# Patient Record
Sex: Female | Born: 1940 | Race: White | Hispanic: No | Smoking: Former smoker
Health system: Southern US, Community
[De-identification: ages and names within clinical notes are randomized; demographics above are authoritative.]

## PROBLEM LIST (undated history)

## (undated) DIAGNOSIS — W19XXXA Unspecified fall, initial encounter: Secondary | ICD-10-CM

## (undated) DIAGNOSIS — I10 Essential (primary) hypertension: Secondary | ICD-10-CM

## (undated) DIAGNOSIS — J309 Allergic rhinitis, unspecified: Secondary | ICD-10-CM

## (undated) DIAGNOSIS — D689 Coagulation defect, unspecified: Secondary | ICD-10-CM

## (undated) DIAGNOSIS — Z8679 Personal history of other diseases of the circulatory system: Secondary | ICD-10-CM

## (undated) DIAGNOSIS — I2510 Atherosclerotic heart disease of native coronary artery without angina pectoris: Secondary | ICD-10-CM

## (undated) DIAGNOSIS — S069X9A Unspecified intracranial injury with loss of consciousness of unspecified duration, initial encounter: Secondary | ICD-10-CM

## (undated) DIAGNOSIS — S069XAA Unspecified intracranial injury with loss of consciousness status unknown, initial encounter: Secondary | ICD-10-CM

## (undated) DIAGNOSIS — F410 Panic disorder [episodic paroxysmal anxiety] without agoraphobia: Secondary | ICD-10-CM

## (undated) DIAGNOSIS — E785 Hyperlipidemia, unspecified: Secondary | ICD-10-CM

## (undated) DIAGNOSIS — I340 Nonrheumatic mitral (valve) insufficiency: Secondary | ICD-10-CM

## (undated) DIAGNOSIS — R32 Unspecified urinary incontinence: Secondary | ICD-10-CM

## (undated) DIAGNOSIS — G4733 Obstructive sleep apnea (adult) (pediatric): Secondary | ICD-10-CM

## (undated) DIAGNOSIS — R296 Repeated falls: Secondary | ICD-10-CM

## (undated) DIAGNOSIS — E039 Hypothyroidism, unspecified: Secondary | ICD-10-CM

## (undated) DIAGNOSIS — F419 Anxiety disorder, unspecified: Secondary | ICD-10-CM

## (undated) DIAGNOSIS — I772 Rupture of artery: Secondary | ICD-10-CM

## (undated) HISTORY — PX: MULTIPLE TOOTH EXTRACTIONS: SHX2053

## (undated) HISTORY — PX: BUNIONETTE EXCISION: SUR500

## (undated) HISTORY — DX: Atherosclerotic heart disease of native coronary artery without angina pectoris: I25.10

## (undated) HISTORY — DX: Essential (primary) hypertension: I10

## (undated) HISTORY — DX: Unspecified intracranial injury with loss of consciousness of unspecified duration, initial encounter: S06.9X9A

## (undated) HISTORY — DX: Coagulation defect, unspecified: D68.9

## (undated) HISTORY — DX: Hypothyroidism, unspecified: E03.9

## (undated) HISTORY — PX: TOTAL KNEE ARTHROPLASTY: SHX125

## (undated) HISTORY — DX: Obstructive sleep apnea (adult) (pediatric): G47.33

## (undated) HISTORY — DX: Unspecified intracranial injury with loss of consciousness status unknown, initial encounter: S06.9XAA

## (undated) HISTORY — DX: Rupture of artery: I77.2

## (undated) HISTORY — PX: THYROIDECTOMY: SHX17

## (undated) HISTORY — DX: Personal history of other diseases of the circulatory system: Z86.79

## (undated) HISTORY — DX: Allergic rhinitis, unspecified: J30.9

## (undated) HISTORY — DX: Hyperlipidemia, unspecified: E78.5

## (undated) HISTORY — DX: Repeated falls: R29.6

## (undated) HISTORY — DX: Unspecified urinary incontinence: R32

## (undated) HISTORY — DX: Anxiety disorder, unspecified: F41.9

## (undated) HISTORY — DX: Nonrheumatic mitral (valve) insufficiency: I34.0

## (undated) HISTORY — DX: Panic disorder (episodic paroxysmal anxiety): F41.0

## (undated) HISTORY — DX: Unspecified fall, initial encounter: W19.XXXA

---

## 2001-11-12 ENCOUNTER — Other Ambulatory Visit: Admission: RE | Admit: 2001-11-12 | Discharge: 2001-11-12 | Payer: Self-pay | Admitting: Family Medicine

## 2004-09-20 ENCOUNTER — Ambulatory Visit: Payer: Self-pay | Admitting: Family Medicine

## 2005-02-24 ENCOUNTER — Ambulatory Visit: Payer: Self-pay | Admitting: Otolaryngology

## 2005-02-24 ENCOUNTER — Other Ambulatory Visit: Payer: Self-pay

## 2005-03-03 ENCOUNTER — Inpatient Hospital Stay: Payer: Self-pay | Admitting: Otolaryngology

## 2006-01-05 ENCOUNTER — Ambulatory Visit: Payer: Self-pay | Admitting: Family Medicine

## 2007-07-04 HISTORY — PX: CARDIAC CATHETERIZATION: SHX172

## 2007-07-04 HISTORY — PX: CORONARY STENT PLACEMENT: SHX1402

## 2007-07-09 ENCOUNTER — Inpatient Hospital Stay: Payer: Self-pay | Admitting: Cardiovascular Disease

## 2007-07-09 ENCOUNTER — Other Ambulatory Visit: Payer: Self-pay

## 2007-09-05 ENCOUNTER — Encounter: Payer: Self-pay | Admitting: Cardiovascular Disease

## 2007-10-04 ENCOUNTER — Encounter: Payer: Medicare Other | Admitting: Cardiovascular Disease

## 2007-11-04 ENCOUNTER — Encounter: Payer: Medicare Other | Admitting: Cardiovascular Disease

## 2007-12-02 ENCOUNTER — Encounter: Payer: Medicare Other | Admitting: Cardiovascular Disease

## 2008-04-22 ENCOUNTER — Emergency Department: Payer: Medicare Other | Admitting: Emergency Medicine

## 2009-03-11 ENCOUNTER — Ambulatory Visit: Payer: Self-pay | Admitting: Orthopedic Surgery

## 2009-03-19 ENCOUNTER — Ambulatory Visit: Payer: Self-pay | Admitting: Orthopedic Surgery

## 2009-04-18 ENCOUNTER — Emergency Department: Payer: Self-pay | Admitting: Emergency Medicine

## 2009-05-11 ENCOUNTER — Ambulatory Visit: Payer: Self-pay | Admitting: Internal Medicine

## 2009-09-23 ENCOUNTER — Emergency Department: Payer: Self-pay | Admitting: Emergency Medicine

## 2009-10-06 ENCOUNTER — Ambulatory Visit: Payer: Self-pay | Admitting: Internal Medicine

## 2010-04-16 ENCOUNTER — Ambulatory Visit: Payer: Self-pay | Admitting: Cardiovascular Disease

## 2010-05-12 ENCOUNTER — Ambulatory Visit: Payer: Self-pay | Admitting: Internal Medicine

## 2010-08-09 ENCOUNTER — Telehealth: Payer: Self-pay | Admitting: Cardiovascular Disease

## 2010-08-12 ENCOUNTER — Ambulatory Visit: Payer: Self-pay | Admitting: Cardiovascular Disease

## 2010-08-20 ENCOUNTER — Encounter: Payer: Self-pay | Admitting: Rheumatology

## 2010-08-23 ENCOUNTER — Telehealth (INDEPENDENT_AMBULATORY_CARE_PROVIDER_SITE_OTHER): Payer: Self-pay | Admitting: Radiology

## 2010-08-24 ENCOUNTER — Ambulatory Visit: Payer: Self-pay | Admitting: Cardiology

## 2010-08-24 ENCOUNTER — Ambulatory Visit: Payer: Self-pay

## 2010-08-24 ENCOUNTER — Encounter (HOSPITAL_COMMUNITY)
Admission: RE | Admit: 2010-08-24 | Discharge: 2010-11-02 | Payer: Self-pay | Source: Home / Self Care | Attending: Cardiovascular Disease | Admitting: Cardiovascular Disease

## 2010-08-24 ENCOUNTER — Encounter: Payer: Self-pay | Admitting: Cardiology

## 2010-08-30 ENCOUNTER — Telehealth: Payer: Self-pay | Admitting: Cardiovascular Disease

## 2010-09-02 ENCOUNTER — Encounter: Payer: Self-pay | Admitting: Rheumatology

## 2010-09-07 ENCOUNTER — Ambulatory Visit: Payer: Self-pay | Admitting: Emergency Medicine

## 2010-09-10 LAB — PATHOLOGY REPORT

## 2010-11-02 NOTE — Assessment & Plan Note (Signed)
Summary: Cardiology Nuclear Testing  Nuclear Med Background Indications for Stress Test: Evaluation for Ischemia, Stent Patency, PTCA Patency   History: Angioplasty, Heart Catheterization, Myocardial Perfusion Study, Stents  History Comments: '08 MPS:(+)>PTCA/Stent-LAD  Symptoms: Dizziness, DOE, Fatigue, Nausea, SOB  Symptoms Comments: .   Nuclear Pre-Procedure Cardiac Risk Factors: History of Smoking, Lipids Caffeine/Decaff Intake: none NPO After: 7:00 PM Lungs: Clear. IV 0.9% NS with Angio Cath: 20g     IV Site: R Hand IV Started by: Doyne Keel, CNMT Chest Size (in) 36     Cup Size B     Height (in): 64 Weight (lb): 111 BMI: 19.12  Nuclear Med Study 1 or 2 day study:  1 day     Stress Test Type:  Stress Reading MD:  Marca Ancona, MD     Referring MD:  Lorine Bears, MD Resting Radionuclide:  Technetium 56m Tetrofosmin     Resting Radionuclide Dose:  11 mCi  Stress Radionuclide:  Technetium 72m Tetrofosmin     Stress Radionuclide Dose:  33 mCi   Stress Protocol Exercise Time (min):  7:00 min     Max HR:  157 bpm     Predicted Max HR:  149 bpm  Max Systolic BP: 206 mm Hg     Percent Max HR:  105.37 %     METS: 8.7 Rate Pressure Product:  46962    Stress Test Technologist:  Rea [employer], CMA-N     Nuclear Technologist:  Doyne Keel, CNMT  Rest Procedure  Myocardial perfusion imaging was performed at rest 45 minutes following the intravenous administration of Technetium 66m Tetrofosmin.  Stress Procedure  The patient exercised for seven minutes.  The patient stopped due to fatigue and denied any chest pain.  There were no significant ST-T wave changes; only occasional PVC's and PAC's.  She had a mild hypertensive response to exercise, 206/85.  Technetium 41m Tetrofosmin was injected at peak exercise and myocardial perfusion imaging was performed after a brief delay.  QPS Raw Data Images:  Normal; no motion artifact; normal heart/lung ratio. Stress Images:  Mild basal  to mid inferior and inferolateral perfusion defect.  Rest Images:  Mild basal to mid inferior and inferolateral perfusion defect.  Subtraction (SDS):  Mild fixed basal to mid inferior and inferolateral perfusion defect.  Transient Ischemic Dilatation:  0.89  (Normal <1.22)  Lung/Heart Ratio:  0.29  (Normal <0.45)  Quantitative Gated Spect Images QGS EDV:  76 ml QGS ESV:  28 ml QGS EF:  63 % QGS cine images:  Normal wall motion.    Overall Impression  Exercise Capacity: Good exercise capacity. BP Response: Hypertensive blood pressure response. Clinical Symptoms: No chest pain, stopped due to fatigue.  ECG Impression: Insignificant upsloping ST segment depression. Overall Impression: Mild fixed basal to mid inferior and inferolateral perfusion defect.  Given normal wall motion, favor attenuation.   Overall Impression Comments: Low risk study.

## 2010-11-02 NOTE — Progress Notes (Signed)
Summary: stress test results  Phone Note Outgoing Call   Call placed by: Dessie Coma  LPN,  August 30, 2010 3:58 PM Call placed to: Patient Summary of Call: LMVM-notified patient per Dr. Kirke Corin, stress test was normal.  If any questions to call office.

## 2010-11-02 NOTE — Progress Notes (Signed)
Summary: bleeding problems on Plavix  Phone Note Call from Patient   Caller: Patient Summary of Call: Patient wants to know if OK to have a colonoscopy this Wednesday.  Is on Plavix and states she has a lot of bleeding problems-several nose bleeds, excessive bleeding from where dog scratched her Sunday.  Should she stop Plavix before-not sure what to do.  Initial call taken by: Dessie Coma  LPN,  August 09, 2010 8:54 AM  Follow-up for Phone Call        Phone Call Completed:  Patient's daughter called -informed her per Dr. Kirke Corin, OK for patient to have colonoscopy but needs to hold Plavix for 1 week prior.  Appt. scheduled for patient due to patient having nosebleeds-thinks her BP may be elevated.  Follow-up by: Dessie Coma  LPN,  August 09, 2010 1:23 PM

## 2010-11-02 NOTE — Progress Notes (Signed)
Summary: nuc pre-procedure  Phone Note Outgoing Call   Call placed by: Harlow Asa CNMT Call placed to: Patient Reason for Call: Confirm/change Appt Summary of Call: Reviewed information on [facility] Information Sheet (see scanned document for further details).  Spoke with patient.  Initial call taken by: Harlow Asa CNMT     Nuclear Med Background Indications for Stress Test: Evaluation for Ischemia, Stent Patency, PTCA Patency   History: Angioplasty, Heart Catheterization, Stents  History Comments: '08 cath/ptca/stents-LAD  Symptoms: Nausea  Symptoms Comments: headache   Nuclear Pre-Procedure Cardiac Risk Factors: Lipids

## 2011-02-03 ENCOUNTER — Encounter: Payer: Self-pay | Admitting: Cardiovascular Disease

## 2011-02-04 ENCOUNTER — Ambulatory Visit: Payer: Self-pay | Admitting: Cardiovascular Disease

## 2011-02-07 ENCOUNTER — Ambulatory Visit (INDEPENDENT_AMBULATORY_CARE_PROVIDER_SITE_OTHER): Payer: Medicare Other | Admitting: Cardiovascular Disease

## 2011-02-07 ENCOUNTER — Encounter: Payer: Self-pay | Admitting: Cardiovascular Disease

## 2011-02-07 DIAGNOSIS — I2510 Atherosclerotic heart disease of native coronary artery without angina pectoris: Secondary | ICD-10-CM | POA: Insufficient documentation

## 2011-02-07 DIAGNOSIS — E785 Hyperlipidemia, unspecified: Secondary | ICD-10-CM | POA: Insufficient documentation

## 2011-02-07 NOTE — Assessment & Plan Note (Signed)
She had a recent lipid profile which showed a total cholesterol of 176, triglyceride of 74, HDL of 50 and an LDL of 111. His CBC was unremarkable. CMP was within normal limits as well. The patient was not taking her Crestor regularly and also was not faithful with healthy diet. She is now taking the medication regularly and I asked her to improve her diet. I recommend repeating lipid profile in 3 months from now. Goal LDL is less than 70.  Would consider increasing Crestor at that time.

## 2011-02-07 NOTE — Assessment & Plan Note (Signed)
The patient is doing well at this time with no symptoms suggestive of angina, heart failure or arrhythmia. Her stress test from November of last year was overall unremarkable. Would continue with medical therapy for now. She was taken off Plavix given that she was treated for more than a few years after a drug-eluting stent and she was having extensive bruising with it.

## 2011-02-07 NOTE — Progress Notes (Signed)
HPI  Monica Downs is a 70 year old female who is here today for a followup visit. Overall she has been doing well since her last visit. The patient had multiple complaints during her last visit and thus she underwent nuclear stress test which over all showed no evidence of ischemia with normal ejection fraction. She underwent an EGD and colonoscopy. She was found to have some polyps that were removed. Overall, they were benign. She had her cholesterol checked which showed elevated LDL at 111. The patient was not taking Crestor regularly everyday at that time. She also was eating a lot of peanut butter. She denies any chest pain. She has mild exertional dyspnea which has not changed. There is no palpitations, syncope or presyncope. She was taken off Plavix recently without any reported events.  Allergies  Allergen Reactions  . Codeine   . Mobic   . Reclast (Zoledronic Acid)      Current Outpatient Prescriptions on File Prior to Visit  Medication Sig Dispense Refill  . aspirin 81 MG tablet Take 162 mg by mouth daily.       . Cholecalciferol (VITAMIN D) 2000 UNITS tablet Take 2,000 Units by mouth daily.        Marland Kitchen levothyroxine (SYNTHROID, LEVOTHROID) 75 MCG tablet Take 75 mcg by mouth daily.        . Multiple Vitamin (MULTIVITAMIN) tablet Take 1 tablet by mouth daily.        . niacin 500 MG tablet Take 500 mg by mouth daily with breakfast.        . rosuvastatin (CRESTOR) 20 MG tablet Take 20 mg by mouth daily.        Marland Kitchen venlafaxine (EFFEXOR-XR) 75 MG 24 hr capsule Take 75 mg by mouth daily.        Marland Kitchen DISCONTD: clopidogrel (PLAVIX) 75 MG tablet Take 75 mg by mouth daily.           Past Medical History  Diagnosis Date  . Hypothyroidism   . CAD (coronary artery disease)   . HLD (hyperlipidemia)      Past Surgical History  Procedure Date  . Thyroidectomy   . Coronary stent placement 07/2007    Mid LAD: 3.0 X 18 mm Xience stent  . Bunionette excision   . Cardiac catheterization 07/2007   99% mid LAD stenosis     Family History  Problem Relation Age of Onset  . Heart disease    . Colon cancer    . Lymphoma       History   Social History  . Marital Status: Widowed    Spouse Name: N/A    Number of Children: 1  . Years of Education: N/A   Occupational History  . retired    Social History Main Topics  . Smoking status: Former Games developer  . Smokeless tobacco: Not on file  . Alcohol Use: No  . Drug Use: No  . Sexually Active: Not on file   Other Topics Concern  . Not on file   Social History Narrative  . No narrative on file       PHYSICAL EXAM   BP 118/71  Pulse 82  Ht 5\' 4"  (1.626 m)  Wt 112 lb (50.803 kg)  BMI 19.22 kg/m2  SpO2 97%  Constitutional: She is oriented to person, place, and time. She appears well-developed and well-nourished. No distress.  HENT: No nasal discharge.  Head: Normocephalic and atraumatic.  Eyes: Pupils are equal, round, and reactive to light. Right eye  exhibits no discharge. Left eye exhibits no discharge.  Neck: Normal range of motion. Neck supple. No JVD present. No thyromegaly present.  Cardiovascular: Normal rate, regular rhythm, normal heart sounds and intact distal pulses. Exam reveals no gallop and no friction rub.  No murmur heard.  Pulmonary/Chest: Effort normal and breath sounds normal. No stridor. No respiratory distress. She has no wheezes. She has no rales. She exhibits no tenderness.  Abdominal: Soft. Bowel sounds are normal. She exhibits no distension. There is no tenderness. There is no rebound and no guarding.  Musculoskeletal: Normal range of motion. She exhibits no edema and no tenderness.  Neurological: She is alert and oriented to person, place, and time. Coordination normal.  Skin: Skin is warm and dry. No rash noted. She is not diaphoretic. No erythema. No pallor.  Psychiatric: She has a normal mood and affect. Her behavior is normal. Judgment and thought content normal.     ASSESSMENT AND  PLAN

## 2011-02-15 NOTE — Assessment & Plan Note (Signed)
[facility]                        [place] CARDIOLOGY OFFICE NOTE   NAME:Downs, Monica DROTAR                       MRN:          [id number]  DATE:08/12/2010                            DOB:          13-Feb-1941    Monica Downs is a 70 year old female who is here today for a followup  visit.  She has the following problem list:  1. History of coronary artery disease status post angioplasty and a      drug-eluting stent placement to the mid left anterior descending      coronary artery in October 2008.  The stent was Xience 3.0 x 18 mm.      Cardiac catheterization at that time showed 99% mid left anterior      descending coronary artery stenosis.  2. Hyperlipidemia.  3. Hypothyroidism.  4. Previous tobacco use.   INTERVAL HISTORY:  Monica Downs is here today on an urgent basis.  She has  not been feeling very well.  She has been having nausea and occasional  headache.  Some of these symptoms are similar to her previous  manifestations before her angioplasty.  Interestingly, although she had  a 99% mid LAD stenosis at that time, she did not have any chest pain and  no significant dyspnea.  She is also have been having difficulty with  easy bruising and bleeding.  Sometimes if she gets scratched, the  bleeding does not stop for more than an hour.  This has been a problem  for her especially that she has lots of dogs at home.  Otherwise she has  been taking the rest of her medications.  She has been having some  weight loss and is planned to have colonoscopy and EGD done.   MEDICATIONS:  1. Synthroid 0.75 mg once daily.  2. Multivitamin once daily.  3. Niacin 500 mg at bedtime.  4. Plavix 75 mg once daily, this will be stopped today.  5. Aspirin 81 mg once daily, this will be increased to 162 mg once      daily.  6. Vitamin D3 once daily.  7. Crestor 20 mg at bedtime.  8. Venlafaxine 75 mg once daily.   PHYSICAL EXAMINATION:  VITAL SIGNS:  Weight is 114  pounds, blood  pressure is 129/70, pulse is 76, oxygen saturation is 98% on room air.  NECK:  Reveals no JVD or carotid bruits.  LUNGS:  Clear to auscultation.  HEART:  Regular rate and rhythm with no gallops or murmurs.  ABDOMEN:  Benign, nontender, nondistended.  EXTREMITIES:  With no clubbing, cyanosis, or edema.  SKIN:  There is scattered bruising throughout upper and lower  extremities.   IMPRESSION:  1. Coronary artery disease status post angioplasty and drug-eluting      stent placement to the mid left anterior descending coronary artery      in October 2008.  She is currently having some symptoms of nausea      and headache that are similar to her previous stent placement.  Ms.      Downs never had chest pain even before her angioplasty.  Thus, we      will go ahead and obtain treadmill stress test for further      evaluation.  She has been having a significant bruising and      bleeding problems likely related to treatment with Plavix.  Her      stent placement was more than 3 years ago.  Thus, I think it is      safe enough to stop the Plavix at this time and continue with      aspirin 162 mg once daily.  I explained to her and her daughter      that there is always a small chance of very late stent thrombosis,      but it seems that risks of continuation outweigh the benefits at      this time.  The stress test will be done before proceeding with her      colonoscopy and EGD.  2. Hyperlipidemia:  We will continue with Crestor and niacin.  She      will follow up with me in 6 months from now or earlier if needed.     Lorine Bears, MD  Electronically Signed    MA/MedQ  DD: 08/12/2010  DT: 08/13/2010  Job #: [id number]

## 2011-02-15 NOTE — Assessment & Plan Note (Signed)
[facility]                        [place] CARDIOLOGY OFFICE NOTE   NAME:Downs, Monica ABUNDIS                       MRN:          [id number]  DATE:04/16/2010                            DOB:          09-09-1941    CHIEF COMPLAINT:  Followup.   PROBLEM LIST:  1. History of coronary artery disease, status post angioplasty and a      drug-eluting stent placement to the mid left anterior descending      coronary artery in October 2008.  The stent was a Xience 3.0 x 18      mm.  Cardiac catheterization at that time showed a 99% mid left      anterior descending coronary artery disease.  2. Hyperlipidemia.  3. Hypothyroidism.  4. Previous smoking.   INTERVAL HISTORY:  Monica Downs is here today to establish her care.  She  used to follow up with me in [place].  She is a pleasant 70 year old  female with known history of coronary artery disease as outlined above.  Overall since her angioplasty in 2008, she has been relatively stable  from a cardiac standpoint.  She did have few episodes of chest pain  after that, which was evaluated with a stress test most recently, early  this year.  The stress test at that time showed a fixed inferior defect,  which was felt to be due to artifact.   CURRENT MEDICATIONS:  1. Synthroid 0.75 mg once daily.  2. Multivitamin once daily.  3. Caltrate and vitamin D once daily.  4. Niacin 500 mg once daily.  5. Plavix 75 mg daily.  6. Aspirin 81 mg once daily.  7. Vitamin D 2000 units once daily.  8. Crestor 20 mg nightly.   ALLERGIES:  Include TRICLOSAN, CODEINE, and MOBIC.  Last time she was  seen here, she had 2 allergic reactions that required emergency room  visits with respiratory distress.  It was thought might have been due to  [facility] and RECLAST.   SOCIAL HISTORY:  Negative for smoking.  She quit in 1998.  There is no  alcohol or drug use.   FAMILY HISTORY:  Noncontributory.   REVIEW OF SYSTEMS:  Otherwise  negative.   PHYSICAL EXAMINATION:  GENERAL:  She is in no acute distress, but she  seems to be underweight.  VITAL SIGNS:  Weight is 116.8 pounds, blood pressure is 124/74 in the  left arm, pulse is 81, oxygen saturation is 97% on room air.  NECK:  No masses.  No JVD or carotid bruits.  RESPIRATORY:  Normal respiratory effort with no use of accessory  muscles.  Auscultation reveals normal breath sounds with no wheezing or  crackles.  CARDIOVASCULAR:  Normal PMI.  Normal S1 and S2.  There are no gallops or  murmurs.  ABDOMEN:  Benign, nontender, nondistended.  EXTREMITIES:  No clubbing, cyanosis, or edema.  Pedal pulses are normal.  SKIN:  The patient has multiple areas of small ecchymosis, which is  chronic.   Electrocardiogram was done and reviewed by me.  She has normal sinus  rhythm with incomplete right bundle-branch  block.  There is T-wave  inversion in V1-V3 suggestive of ischemia.   IMPRESSION:  1. Coronary artery disease, status post left anterior descending      coronary artery percutaneous coronary intervention in 2008.      Currently, she is not having any symptoms suggestive of ischemia.      Most recent stress test was in January 2011.  We will continue with      the medical therapy for now with dual antiplatelet therapy with      aspirin and Plavix.  Her blood pressure tends to run low, and she      is not on any beta-blockers at this time for that reason.  2. Hyperlipidemia.  We will continue with Crestor.  She will likely      need a fasting lipid profile during her next visit.  The patient      will follow up in 6 months from now or earlier if needed.     Lorine Bears, MD  Electronically Signed    MA/MedQ  DD: 04/16/2010  DT: 04/17/2010  Job #: [id number]

## 2011-05-04 ENCOUNTER — Telehealth: Payer: Self-pay | Admitting: Cardiovascular Disease

## 2011-05-04 NOTE — Telephone Encounter (Signed)
Pt's dtr has question re cholesterol med

## 2011-05-04 NOTE — Telephone Encounter (Signed)
Spoke with Pt she wants to make sure we rcvd info from [facility], she went there not feeling well since [facility] was not open on a Friday. I told her I would check in [place] tomorrow when I am there. I also informed her of our office in [place], if she needed something and we were not open she could call them and we would do whatever we could there also.

## 2011-05-12 ENCOUNTER — Encounter: Payer: Self-pay | Admitting: Cardiovascular Disease

## 2011-05-25 ENCOUNTER — Telehealth: Payer: Self-pay | Admitting: Cardiovascular Disease

## 2011-05-25 NOTE — Telephone Encounter (Signed)
Per pt call, pt was recently prescribed crestor. Pt said she is now having a lot of nose bleeds. Pt is concerned crestor is thinning pt blood too much. Please return pt call to advise/discuss.

## 2011-05-25 NOTE — Telephone Encounter (Signed)
Advised patient that Crestor should not be causing her nose bleeds. She has been having nose bleeds off & on for about 4-5 days. She is able to control it. I have advised her to follow up with her PCP.

## 2011-07-05 ENCOUNTER — Encounter: Payer: Self-pay | Admitting: Cardiovascular Disease

## 2011-07-06 ENCOUNTER — Ambulatory Visit: Payer: Medicare Other | Admitting: Internal Medicine

## 2011-07-20 ENCOUNTER — Encounter: Payer: Self-pay | Admitting: Cardiovascular Disease

## 2011-07-21 ENCOUNTER — Ambulatory Visit (INDEPENDENT_AMBULATORY_CARE_PROVIDER_SITE_OTHER): Payer: Medicare Other | Admitting: Cardiovascular Disease

## 2011-07-21 ENCOUNTER — Encounter: Payer: Self-pay | Admitting: Cardiovascular Disease

## 2011-07-21 VITALS — BP 121/74 | HR 83 | Ht 64.0 in | Wt 109.0 lb

## 2011-07-21 DIAGNOSIS — I2510 Atherosclerotic heart disease of native coronary artery without angina pectoris: Secondary | ICD-10-CM

## 2011-07-21 MED ORDER — ROSUVASTATIN CALCIUM 20 MG PO TABS: 20.0000 mg | ORAL_TABLET | Freq: Every day | ORAL | Status: DC

## 2011-07-21 NOTE — Patient Instructions (Addendum)
Your physician wants you to follow-up in: 6 months in [place]. You will receive a reminder letter in the mail one-two months in advance. If you don't receive a letter, please call our office to schedule the follow-up appointment. Decrease Crestor to 20 mg every night.

## 2011-07-21 NOTE — Assessment & Plan Note (Signed)
The patient's last lipid profile on file showed an LDL of 110. She was taking 10 mg of Crestor at that time. Currently she is taking 40 mg daily but reports problems with memory since then. I will go ahead and decrease the dose to 20 mg once daily. I discussed with her the importance of watching her diet.

## 2011-07-21 NOTE — Assessment & Plan Note (Signed)
Patient seems to be doing well at this time with no symptoms suggestive of angina, arrhythmia or heart failure. Recent stress test was normal. Continue medical therapy.

## 2011-07-21 NOTE — Progress Notes (Signed)
HPI  This is a 70 year old female who is here today for a followup visit. She has known history of coronary artery disease status post angioplasty and drug-eluting stent placement to the left anterior descending artery. Overall she has been doing very well. She denies any chest pain or dyspnea. She had an episode of anxiety recently associated with nausea and feeling sick in her stomach. She was evaluated by Dr.Khan in [place] and underwent a nuclear stress test which showed no evidence of ischemia. She has not had any further problems. The dose of Crestor has been increased to 40 mg once daily. However, since then she's been having problems with her memory.  Allergies  Allergen Reactions  . Codeine   . Mobic   . Reclast (Zoledronic Acid)      Current Outpatient Prescriptions on File Prior to Visit  Medication Sig Dispense Refill  . aspirin 81 MG tablet Take 162 mg by mouth daily.       . Cholecalciferol (VITAMIN D) 2000 UNITS tablet Take 2,000 Units by mouth daily.        Marland Kitchen levothyroxine (SYNTHROID, LEVOTHROID) 75 MCG tablet Take 75 mcg by mouth daily.        . Multiple Vitamin (MULTIVITAMIN) tablet Take 1 tablet by mouth daily.        . niacin 500 MG tablet Take 500 mg by mouth daily with breakfast.        . venlafaxine (EFFEXOR-XR) 75 MG 24 hr capsule Take 75 mg by mouth daily.           Past Medical History  Diagnosis Date  . Hypothyroidism   . CAD (coronary artery disease)   . HLD (hyperlipidemia)      Past Surgical History  Procedure Date  . Thyroidectomy   . Coronary stent placement 07/2007    Mid LAD: 3.0 X 18 mm Xience stent  . Bunionette excision   . Cardiac catheterization 07/2007    99% mid LAD stenosis     Family History  Problem Relation Age of Onset  . Heart disease    . Colon cancer    . Lymphoma       History   Social History  . Marital Status: Widowed    Spouse Name: N/A    Number of Children: 1  . Years of Education: N/A   Occupational  History  . retired    Social History Main Topics  . Smoking status: Former Smoker -- 0.1 packs/day for 2 years    Types: Cigarettes    Quit date: 10/04/1991  . Smokeless tobacco: Never Used   Comment: smoked on and off  . Alcohol Use: No  . Drug Use: No  . Sexually Active: Not on file   Other Topics Concern  . Not on file   Social History Narrative  . No narrative on file     PHYSICAL EXAM   BP 121/74  Pulse 83  Ht 5\' 4"  (1.626 m)  Wt 109 lb (49.442 kg)  BMI 18.71 kg/m2  Constitutional: She is oriented to person, place, and time. She appears underweight. No distress.  HENT: No nasal discharge.  Head: Normocephalic and atraumatic.  Eyes: Pupils are equal, round, and reactive to light. Right eye exhibits no discharge. Left eye exhibits no discharge.  Neck: Normal range of motion. Neck supple. No JVD present. No thyromegaly present.  Cardiovascular: Normal rate, regular rhythm, normal heart sounds and intact distal pulses. Exam reveals no gallop and no friction rub.  No murmur heard.  Pulmonary/Chest: Effort normal and breath sounds normal. No stridor. No respiratory distress. She has no wheezes. She has no rales. She exhibits no tenderness.  Abdominal: Soft. Bowel sounds are normal. She exhibits no distension. There is no tenderness. There is no rebound and no guarding.  Musculoskeletal: Normal range of motion. She exhibits no edema and no tenderness.  Neurological: She is alert and oriented to person, place, and time. Coordination normal.  Skin: Skin is warm and dry. No rash noted. She is not diaphoretic. No erythema. No pallor.  Psychiatric: She has a normal mood and affect. Her behavior is normal. Judgment and thought content normal.      ASSESSMENT AND PLAN

## 2011-07-29 ENCOUNTER — Ambulatory Visit: Payer: Medicare Other | Admitting: Cardiovascular Disease

## 2011-08-09 ENCOUNTER — Encounter: Payer: Self-pay | Admitting: Cardiovascular Disease

## 2011-08-11 ENCOUNTER — Ambulatory Visit: Payer: Medicare Other | Admitting: Cardiovascular Disease

## 2011-09-23 ENCOUNTER — Telehealth: Payer: Self-pay | Admitting: *Deleted

## 2011-09-23 NOTE — Telephone Encounter (Signed)
She is taking a small dose of Niacin. It should be fine.  She might want to get a blood pressure machine and monitor her BP. I don't think she needs to take Toprol unless her BP goes above 140/90 on more than one occasion.

## 2011-09-23 NOTE — Telephone Encounter (Signed)
Pt's dgt, [name], left message on VM asking for a return call. She states pt saw primary MD this week and was told her BP was high. It was 134/90 with HR of 80. Pt was started on metoprolol ER 25 mg daily per primary MD. Pt's dgt also states they saw on TV that the FDA is going to investigate Niacin b/c it could cause more of a health risk. She wants to know if this is safe to take and if pt should take Metoprolol for BP considering her mother's BP is generally not high.   Spoke with pt. She states when she saw primary MD and was told her BP was a little high. They gave RX for metoprolol ER. She states she took 1 pill but then decided not to take it until she d/w Dr. Kirke Corin. She states her BP is never high, and she wonders if it was high this day of some stressor she's had, including losing her dog. They also heard on TV that Niacin will be investigated. She wants to know if she should take the metoprolol and Niacin.

## 2011-09-23 NOTE — Telephone Encounter (Signed)
Left message to call back on voice mail

## 2011-09-26 NOTE — Telephone Encounter (Signed)
Daughter informed and verbalized understanding of plan. 

## 2011-11-07 ENCOUNTER — Encounter: Payer: Self-pay | Admitting: Cardiovascular Disease

## 2012-01-20 ENCOUNTER — Ambulatory Visit (INDEPENDENT_AMBULATORY_CARE_PROVIDER_SITE_OTHER): Payer: Medicare Other | Admitting: Cardiovascular Disease

## 2012-01-20 ENCOUNTER — Encounter: Payer: Self-pay | Admitting: Cardiovascular Disease

## 2012-01-20 VITALS — BP 120/74 | HR 75 | Ht 65.0 in | Wt 112.1 lb

## 2012-01-20 DIAGNOSIS — I2510 Atherosclerotic heart disease of native coronary artery without angina pectoris: Secondary | ICD-10-CM

## 2012-01-20 DIAGNOSIS — E785 Hyperlipidemia, unspecified: Secondary | ICD-10-CM

## 2012-01-20 NOTE — Assessment & Plan Note (Signed)
Continue treatment with Crestor 20 mg once daily. Target LDL is less than 70.

## 2012-01-20 NOTE — Patient Instructions (Signed)
Your physician wants you to follow-up in: 6 months with Dr. Kirke Corin. You will receive a reminder letter in the mail two months in advance. If you don't receive a letter, please call our office to schedule the follow-up appointment.  NO CHANGES TODAY

## 2012-01-20 NOTE — Assessment & Plan Note (Addendum)
Patient seems to be doing well at this time with no symptoms suggestive of angina, arrhythmia or heart failure. Stress test last year was normal. Continue medical therapy. The patient is thinking about having left knee replacement. I don't see any contraindication from a cardiac standpoint. She would be considered at an overall low risk for cardiac complications.

## 2012-01-20 NOTE — Progress Notes (Signed)
HPI  This is a 71 year old female who is here today for followup visit. She has known history of coronary artery disease status post drug-eluting stent placement to the left anterior descending artery in 2008. No cardiac events since then. Most recent stress test was last year which showed no evidence of ischemia. She continues to do well from a cardiac standpoint and denies any chest pain or dyspnea. She had a prolonged problem with inability to gain weight and has been evaluated at [facility]. It appears that no significant etiology was found. She is having significant problems with left knee arthritis and thinking about having knee replacement done.  Allergies  Allergen Reactions  . Codeine   . Mobic   . Reclast (Zoledronic Acid)      Current Outpatient Prescriptions on File Prior to Visit  Medication Sig Dispense Refill  . aspirin 81 MG tablet Take 162 mg by mouth daily.       . Cholecalciferol (VITAMIN D) 2000 UNITS tablet Take 2,000 Units by mouth daily.        Marland Kitchen levothyroxine (SYNTHROID, LEVOTHROID) 75 MCG tablet Take 75 mcg by mouth daily.        . Multiple Vitamin (MULTIVITAMIN) tablet Take 1 tablet by mouth daily.        . niacin 500 MG tablet Take 500 mg by mouth daily with breakfast.        . rosuvastatin (CRESTOR) 20 MG tablet Take 1 tablet (20 mg total) by mouth daily.  30 tablet  6  . venlafaxine (EFFEXOR-XR) 75 MG 24 hr capsule Take 75 mg by mouth daily.           Past Medical History  Diagnosis Date  . Hypothyroidism   . CAD (coronary artery disease)   . HLD (hyperlipidemia)      Past Surgical History  Procedure Date  . Thyroidectomy   . Coronary stent placement 07/2007    Mid LAD: 3.0 X 18 mm Xience stent  . Bunionette excision   . Cardiac catheterization 07/2007    99% mid LAD stenosis     Family History  Problem Relation Age of Onset  . Heart disease    . Colon cancer    . Lymphoma       History   Social History  . Marital Status: Widowed   Spouse Name: N/A    Number of Children: 1  . Years of Education: N/A   Occupational History  . retired    Social History Main Topics  . Smoking status: Former Smoker -- 0.1 packs/day for 2 years    Types: Cigarettes    Quit date: 10/04/1991  . Smokeless tobacco: Never Used   Comment: smoked on and off  . Alcohol Use: No  . Drug Use: No  . Sexually Active: Not on file   Other Topics Concern  . Not on file   Social History Narrative  . No narrative on file     PHYSICAL EXAM   BP 120/74  Pulse 75  Ht 5\' 5"  (1.651 m)  Wt 112 lb 1.9 oz (50.857 kg)  BMI 18.66 kg/m2 Constitutional: She is oriented to person, place, and time. She appears well-developed and well-nourished. No distress.  HENT: No nasal discharge.  Head: Normocephalic and atraumatic.  Eyes: Pupils are equal and round. Right eye exhibits no discharge. Left eye exhibits no discharge.  Neck: Normal range of motion. Neck supple. No JVD present. No thyromegaly present.  Cardiovascular: Normal rate, regular  rhythm, normal heart sounds. Exam reveals no gallop and no friction rub. No murmur heard.  Pulmonary/Chest: Effort normal and breath sounds normal. No stridor. No respiratory distress. She has no wheezes. She has no rales. She exhibits no tenderness.  Abdominal: Soft. Bowel sounds are normal. She exhibits no distension. There is no tenderness. There is no rebound and no guarding.  Musculoskeletal: Normal range of motion. She exhibits no edema and no tenderness.  Neurological: She is alert and oriented to person, place, and time. Coordination normal.  Skin: Skin is warm and dry. No rash noted. She is not diaphoretic. No erythema. No pallor.  Psychiatric: She has a normal mood and affect. Her behavior is normal. Judgment and thought content normal.     EKG: Normal sinus rhythm with no significant ST changes.   ASSESSMENT AND PLAN

## 2012-02-29 ENCOUNTER — Telehealth: Payer: Self-pay | Admitting: Cardiovascular Disease

## 2012-02-29 NOTE — Telephone Encounter (Signed)
After reviewing last office note, I called pt to tell her she does not need to be seen again prior to knee surg.  Dr. Kirke Corin cleared pt from a cardiac standpoint in last office note. I will fax this note to # provided.

## 2012-02-29 NOTE — Telephone Encounter (Signed)
Pt daughter called stating that she is to have knee surgery and needs to know if she needs to be seen for clearance.  Dr Ricci Barker [facility] ortho phone # [phone] FAX [phone]. Surgery end of August

## 2012-03-08 ENCOUNTER — Telehealth: Payer: Self-pay

## 2012-03-08 NOTE — Telephone Encounter (Signed)
Pt called in to tell me she is to have knee surg in august.  She was told by PCP that LDL was "too high" at 116 and wonders if this will effect surg. i reassured her this should not effect surg.  She wante dto make Dr. Kirke Corin aware of results. I explained he may already be aware.  She also wanted to let him know she is still bruising easily on ASA.  Denies any other s/s bleeding in urine, BM, coughing up blood, etc.   Will let Dr. Kirke Corin know.

## 2012-03-12 NOTE — Telephone Encounter (Signed)
The high LDL should not affect the surgery.  We have to keep her on small dose Aspirin.

## 2012-05-22 ENCOUNTER — Telehealth: Payer: Self-pay | Admitting: Cardiovascular Disease

## 2012-05-22 NOTE — Telephone Encounter (Signed)
Because of the previous stent, I prefer that she stays on aspirin 81 mg once daily. Most surgeons are okay with that.

## 2012-05-22 NOTE — Telephone Encounter (Signed)
Busy

## 2012-05-22 NOTE — Telephone Encounter (Signed)
Pt called states that she has a procedure coming up and wants to talk about her medications with nurse

## 2012-05-22 NOTE — Telephone Encounter (Signed)
Pt was notified.  

## 2012-05-22 NOTE — Telephone Encounter (Signed)
Monica Downs is scheduled for a knee replacement on 06/01/12 and wants to know if it is ok for her to stop her aspirin?  She states she takes 81mg  daily.

## 2012-06-15 ENCOUNTER — Telehealth: Payer: Self-pay | Admitting: Cardiovascular Disease

## 2012-06-15 NOTE — Telephone Encounter (Signed)
Dtr wanted [place] to know pt had knee replacement as planned at [facility] 05/31/12.  She then had an allergic reaction post-op, tongue swelling, nausea and vomiting.  She was also told she developed a blood clot in back of throat.  She was also dx with sleep apnea during hospitalization. She was in hosp x 4 days and d/c to rehab facility. She was d/c home yesterday. She was d/c home on ASA 81 mg BID but during rehab was getting 325 mg daily. She wants to confirm with Dr. Kirke Corin which dose she should be on. I told her I would discuss with Dr. Kirke Corin and call her back. Understanding verb.

## 2012-06-15 NOTE — Telephone Encounter (Signed)
LMTCB

## 2012-06-15 NOTE — Telephone Encounter (Signed)
Pt's daughter Monica Downs called to let physician know about her mother's knee replacement on August 29th at [facility].  Pt had a blood clot in the back of her throat.  They found that she also has sleep apnea and had an allergic reaction to Zofran.  She has questions about the aspirin regimen of 81mg  2x a day and should she stay on that due to the blood clot.  She also has questions about a reaction to oxycodone which made her tongue swell.  Pt has appt 07/24/12 and would like to know if she needs to be seen sooner.  Also, pt would like to know if she and her daughter can have the physician help both obtain handicap stickers for their cars. Please call to advise.

## 2012-06-18 NOTE — Telephone Encounter (Signed)
Pt informed Understanding verb 

## 2012-06-18 NOTE — Telephone Encounter (Signed)
See below and advise thanks 

## 2012-06-18 NOTE — Telephone Encounter (Signed)
I recommend Aspirin 162 mg once daily (2 low dose Aspirin/day).

## 2012-06-20 ENCOUNTER — Telehealth: Payer: Self-pay

## 2012-06-20 NOTE — Telephone Encounter (Signed)
LM re: handicap forms left at [facility]

## 2012-06-22 ENCOUNTER — Telehealth: Payer: Self-pay | Admitting: Cardiovascular Disease

## 2012-06-22 ENCOUNTER — Other Ambulatory Visit: Payer: Self-pay

## 2012-06-22 MED ORDER — ASCRIPTIN 325 MG PO TABS: 325.0000 mg | ORAL_TABLET | Freq: Every day | ORAL | Status: DC

## 2012-06-22 NOTE — Telephone Encounter (Signed)
Increase Aspirin to 325 mg once daily.

## 2012-06-22 NOTE — Telephone Encounter (Signed)
Dtr says pt had to go to [facility] ER Tuesday night for c/o LE pain and redness in same leg she had knee replacement on.  She was told she has a "small blood clot in capillary on calf" of lower extremity. She was prescribed abx to take for 7 days and was told she does not need to increase/change ASA dose. She wanted to get Dr. Jari Sportsman opinion, to see if staying on ASA 81 mg 2 tabs qd is enough.  I told her I would ask Dr. Kirke Corin and call her back. In the meantime, i have also scheduled appt with Dr. Kirke Corin to discuss all of these issues.  Appt made for 9/24 at 3:15 pm.

## 2012-06-22 NOTE — Telephone Encounter (Signed)
[name] informed. Understanding verb.

## 2012-06-22 NOTE — Telephone Encounter (Signed)
Pt daughter came in a  [place] that pt had to go back to [facility] ER after knee replacement due to a blood clot. Per Platelet count was over 700. Pt takes (2) 81 mg ASA and wants to know if she needs Blood thinner

## 2012-06-22 NOTE — Telephone Encounter (Signed)
Please see below and advise. thanks 

## 2012-06-26 ENCOUNTER — Encounter: Payer: Self-pay | Admitting: Cardiovascular Disease

## 2012-06-26 ENCOUNTER — Ambulatory Visit (INDEPENDENT_AMBULATORY_CARE_PROVIDER_SITE_OTHER): Payer: Medicare Other | Admitting: Cardiovascular Disease

## 2012-06-26 VITALS — BP 100/60 | HR 84 | Ht 64.0 in | Wt 110.8 lb

## 2012-06-26 DIAGNOSIS — E785 Hyperlipidemia, unspecified: Secondary | ICD-10-CM

## 2012-06-26 DIAGNOSIS — I2510 Atherosclerotic heart disease of native coronary artery without angina pectoris: Secondary | ICD-10-CM

## 2012-06-26 DIAGNOSIS — R0602 Shortness of breath: Secondary | ICD-10-CM

## 2012-06-26 NOTE — Patient Instructions (Addendum)
Continue same medications.  Follow up in 6 months.  

## 2012-06-26 NOTE — Assessment & Plan Note (Signed)
The patient seems to be stable from a cardiac standpoint. She has no symptoms suggestive of angina. She had complications related to her left knee replacement but had no cardiac events. Continue medical therapy.

## 2012-06-26 NOTE — Progress Notes (Signed)
HPI  This is a 71 year old female who is here today for followup visit. She has known history of coronary artery disease status post drug-eluting stent placement to the left anterior descending artery in 2008. No cardiac events since then. Most recent stress test was last year which showed no evidence of ischemia.  She continues to do well from a cardiac standpoint and denies any chest pain or dyspnea. She had a prolonged problem with inability to gain weight and has been evaluated at [facility]. It appears that no significant etiology was found. She underwent recent left knee replacement. It appears that she had some allergic reaction to anesthesia. She had no cardiac events. She did have significant swelling also in the left lower extremity. Venous duplex ultrasound showed no evidence of DVT but there was superficial thrombosis according to the patient. I asked her to increase the dose of aspirin 325 mg daily. She denies any chest pain.  Allergies  Allergen Reactions  . Codeine   . Meloxicam   . Reclast (Zoledronic Acid)      Current Outpatient Prescriptions on File Prior to Visit  Medication Sig Dispense Refill  . Aspirin Buf,AlHyd-MgHyd-CaCar, (ASCRIPTIN) 325 MG TABS Take 325 mg by mouth daily at 8 pm.  360 each  0  . Cholecalciferol (VITAMIN D) 2000 UNITS tablet Take 2,000 Units by mouth daily.        Marland Kitchen levothyroxine (SYNTHROID, LEVOTHROID) 75 MCG tablet Take 75 mcg by mouth daily.        . Multiple Vitamin (MULTIVITAMIN) tablet Take 1 tablet by mouth daily.        . niacin 500 MG tablet Take 500 mg by mouth daily with breakfast.        . rosuvastatin (CRESTOR) 20 MG tablet Take 1 tablet (20 mg total) by mouth daily.  30 tablet  6  . venlafaxine (EFFEXOR-XR) 75 MG 24 hr capsule Take 75 mg by mouth daily.           Past Medical History  Diagnosis Date  . Hypothyroidism   . CAD (coronary artery disease)   . HLD (hyperlipidemia)   . Clotting disorder     LEGS     Past Surgical  History  Procedure Date  . Thyroidectomy   . Coronary stent placement 07/2007    Mid LAD: 3.0 X 18 mm Xience stent  . Bunionette excision   . Cardiac catheterization 07/2007    99% mid LAD stenosis  . Total knee arthroplasty     left     Family History  Problem Relation Age of Onset  . Heart disease    . Colon cancer    . Lymphoma       History   Social History  . Marital Status: Widowed    Spouse Name: N/A    Number of Children: 1  . Years of Education: N/A   Occupational History  . retired    Social History Main Topics  . Smoking status: Former Smoker -- 0.1 packs/day for 2 years    Types: Cigarettes    Quit date: 10/04/1991  . Smokeless tobacco: Never Used   Comment: smoked on and off  . Alcohol Use: No  . Drug Use: No  . Sexually Active: Not on file   Other Topics Concern  . Not on file   Social History Narrative  . No narrative on file     PHYSICAL EXAM   BP 100/60  Pulse 84  Ht 5'  4" (1.626 m)  Wt 110 lb 12 oz (50.236 kg)  BMI 19.01 kg/m2 Constitutional: She is oriented to person, place, and time. She appears well-developed and well-nourished. No distress.  HENT: No nasal discharge.  Head: Normocephalic and atraumatic.  Eyes: Pupils are equal and round. Right eye exhibits no discharge. Left eye exhibits no discharge.  Neck: Normal range of motion. Neck supple. No JVD present. No thyromegaly present.  Cardiovascular: Normal rate, regular rhythm, normal heart sounds. Exam reveals no gallop and no friction rub. No murmur heard.  Pulmonary/Chest: Effort normal and breath sounds normal. No stridor. No respiratory distress. She has no wheezes. She has no rales. She exhibits no tenderness.  Abdominal: Soft. Bowel sounds are normal. She exhibits no distension. There is no tenderness. There is no rebound and no guarding.  Musculoskeletal: Normal range of motion. She exhibits no edema and no tenderness.  Neurological: She is alert and oriented to person,  place, and time. Coordination normal.  Skin: Skin is warm and dry. No rash noted. She is not diaphoretic. No erythema. No pallor.  Psychiatric: She has a normal mood and affect. Her behavior is normal. Judgment and thought content normal.  She has surgical scar in the left knee with significant swelling in the left lower extremity.   EKG: Sinus  Rhythm  -Incomplete right bundle branch block.   Voltage criteria for LVH  (R(V5) exceeds 2.60 mV)  -Voltage criteria w/o ST/T abnormality may be normal.     ASSESSMENT AND PLAN

## 2012-06-26 NOTE — Assessment & Plan Note (Signed)
Continue treatment with Crestor with a target LDL of less than 70. 

## 2012-07-11 DIAGNOSIS — Z96659 Presence of unspecified artificial knee joint: Secondary | ICD-10-CM | POA: Insufficient documentation

## 2012-07-24 ENCOUNTER — Ambulatory Visit: Payer: Medicare Other | Admitting: Cardiovascular Disease

## 2012-07-31 ENCOUNTER — Ambulatory Visit: Payer: Self-pay | Admitting: Internal Medicine

## 2012-11-01 ENCOUNTER — Telehealth: Payer: Self-pay | Admitting: *Deleted

## 2012-11-01 NOTE — Telephone Encounter (Signed)
lmtcb

## 2012-11-01 NOTE — Telephone Encounter (Signed)
Pt had a sleep study test ordered from Dr Yves Dill and was told that she needed Cpap machine. Pt thinks her heart is making her stop breathing at night and she doesn't need the machine. Wants to see what Dr Kirke Corin thinks

## 2012-11-01 NOTE — Telephone Encounter (Signed)
FYI

## 2012-11-01 NOTE — Telephone Encounter (Signed)
Pt says she was dx with sleep apnea recently and was told she needs CPAP. She is due to go back for sleep study with CPAP next week She wonders if cardiac issues is what is causing the apnea. I explained it is usually opposite: sleep apnea can worsen cardiac issues versus the other way around and should go ahead with tx per PCP suggestion.  Understanding verb

## 2012-12-05 ENCOUNTER — Encounter: Payer: Self-pay | Admitting: *Deleted

## 2012-12-24 ENCOUNTER — Ambulatory Visit: Payer: Medicare Other | Admitting: Cardiovascular Disease

## 2012-12-24 ENCOUNTER — Ambulatory Visit (INDEPENDENT_AMBULATORY_CARE_PROVIDER_SITE_OTHER): Payer: Medicare Other | Admitting: Cardiovascular Disease

## 2012-12-24 ENCOUNTER — Encounter: Payer: Self-pay | Admitting: Cardiovascular Disease

## 2012-12-24 VITALS — BP 100/50 | HR 73 | Ht 64.0 in | Wt 115.8 lb

## 2012-12-24 DIAGNOSIS — I2510 Atherosclerotic heart disease of native coronary artery without angina pectoris: Secondary | ICD-10-CM

## 2012-12-24 DIAGNOSIS — E785 Hyperlipidemia, unspecified: Secondary | ICD-10-CM

## 2012-12-24 DIAGNOSIS — R0602 Shortness of breath: Secondary | ICD-10-CM

## 2012-12-24 NOTE — Patient Instructions (Addendum)
Continue same medications  Follow up in 1 year

## 2012-12-24 NOTE — Assessment & Plan Note (Signed)
Continue treatment with Crestor. Most recent lipid profile showed a total cholesterol of 170, triglyceride of 75, HDL of 56 and an LDL of 99.

## 2012-12-24 NOTE — Progress Notes (Signed)
HPI  This is a 72 year old female who is here today for followup visit. She has known history of coronary artery disease status post drug-eluting stent placement to the left anterior descending artery in 2008. No cardiac events since then. Most recent stress test was last year which showed no evidence of ischemia.  She continues to do well from a cardiac standpoint and denies any chest pain or dyspnea. She had a prolonged problem with inability to gain weight and has been evaluated at [facility]. It appears that no significant etiology was found. She underwent  left knee replacement in 2013.  She has no complaints today. She was able to gain some weight but has not been careful about her diet.  Allergies  Allergen Reactions  . Codeine   . Meloxicam   . Reclast (Zoledronic Acid)      Current Outpatient Prescriptions on File Prior to Visit  Medication Sig Dispense Refill  . Aspirin Buf,AlHyd-MgHyd-CaCar, (ASCRIPTIN) 325 MG TABS Take 325 mg by mouth daily at 8 pm.  360 each  0  . Cholecalciferol (VITAMIN D) 2000 UNITS tablet Take 2,000 Units by mouth daily.        Marland Kitchen levothyroxine (SYNTHROID, LEVOTHROID) 75 MCG tablet Take 75 mcg by mouth daily.        . Multiple Vitamin (MULTIVITAMIN) tablet Take 1 tablet by mouth daily.        . niacin 500 MG tablet Take 500 mg by mouth daily with breakfast.        . Ondansetron HCl (ZOFRAN PO) Take by mouth daily.      Marland Kitchen sulfamethoxazole-trimethoprim (BACTRIM,SEPTRA) 200-40 MG/5ML suspension Take 10 mLs by mouth 2 (two) times daily.      . traMADol (ULTRAM) 50 MG tablet Take 50 mg by mouth as needed.      . venlafaxine (EFFEXOR-XR) 75 MG 24 hr capsule Take 75 mg by mouth daily.        . rosuvastatin (CRESTOR) 20 MG tablet Take 1 tablet (20 mg total) by mouth daily.  30 tablet  6   No current facility-administered medications on file prior to visit.     Past Medical History  Diagnosis Date  . Hypothyroidism   . CAD (coronary artery disease)   . HLD  (hyperlipidemia)   . Clotting disorder     LEGS  . Allergic rhinitis   . Mitral regurgitation   . Hypertension   . Sleep apnea      Past Surgical History  Procedure Laterality Date  . Thyroidectomy    . Coronary stent placement  07/2007    Mid LAD: 3.0 X 18 mm Xience stent  . Bunionette excision    . Cardiac catheterization  07/2007    99% mid LAD stenosis  . Total knee arthroplasty      left     Family History  Problem Relation Age of Onset  . Heart disease    . Colon cancer    . Lymphoma       History   Social History  . Marital Status: Widowed    Spouse Name: N/A    Number of Children: 1  . Years of Education: N/A   Occupational History  . retired    Social History Main Topics  . Smoking status: Former Smoker -- 0.10 packs/day for 2 years    Types: Cigarettes    Quit date: 10/04/1991  . Smokeless tobacco: Never Used     Comment: smoked on and off  .  Alcohol Use: No  . Drug Use: No  . Sexually Active: Not on file   Other Topics Concern  . Not on file   Social History Narrative  . No narrative on file     PHYSICAL EXAM   BP 100/50  Pulse 73  Ht 5\' 4"  (1.626 m)  Wt 115 lb 12 oz (52.504 kg)  BMI 19.86 kg/m2 Constitutional: She is oriented to person, place, and time. She appears well-developed and well-nourished. No distress.  HENT: No nasal discharge.  Head: Normocephalic and atraumatic.  Eyes: Pupils are equal and round. Right eye exhibits no discharge. Left eye exhibits no discharge.  Neck: Normal range of motion. Neck supple. No JVD present. No thyromegaly present.  Cardiovascular: Normal rate, regular rhythm, normal heart sounds. Exam reveals no gallop and no friction rub. No murmur heard.  Pulmonary/Chest: Effort normal and breath sounds normal. No stridor. No respiratory distress. She has no wheezes. She has no rales. She exhibits no tenderness.  Abdominal: Soft. Bowel sounds are normal. She exhibits no distension. There is no  tenderness. There is no rebound and no guarding.  Musculoskeletal: Normal range of motion. She exhibits no edema and no tenderness.  Neurological: She is alert and oriented to person, place, and time. Coordination normal.  Skin: Skin is warm and dry. No rash noted. She is not diaphoretic. No erythema. No pallor.  Psychiatric: She has a normal mood and affect. Her behavior is normal. Judgment and thought content normal.  She has surgical scar in the left knee with significant swelling in the left lower extremity.   EKG: Sinus  Rhythm  -Incomplete right bundle branch block.   Voltage criteria for LVH  (R(V5) exceeds 2.60 mV)  -Voltage criteria w/o ST/T abnormality may be normal.     ASSESSMENT AND PLAN

## 2012-12-24 NOTE — Assessment & Plan Note (Signed)
She has no symptoms suggestive of angina. Continue medical therapy. 

## 2013-01-08 ENCOUNTER — Ambulatory Visit: Payer: Medicare Other | Admitting: Cardiovascular Disease

## 2013-02-06 DIAGNOSIS — I2510 Atherosclerotic heart disease of native coronary artery without angina pectoris: Secondary | ICD-10-CM | POA: Insufficient documentation

## 2013-04-29 ENCOUNTER — Telehealth: Payer: Self-pay

## 2013-04-29 NOTE — Telephone Encounter (Signed)
Office notes from OV with Dr Yves Dill state a murmur was heard upon exam.  Dr Jari Sportsman last OV note from March states no murmur heard.  Pt has f/u appt scheduled for 06/27/13.  Pt wants to know if she needs a sooner appt.  Please advise.

## 2013-04-29 NOTE — Telephone Encounter (Signed)
Called Dr Ezekiel Slocumb Khan's office requested a copy of OV note from recent visit for Dr Kirke Corin to review.  Called spoke with pt advised I have requested records from primary care will await and have MD review and call back if sooner appt needed.  Pt agrees with plan.

## 2013-04-29 NOTE — Telephone Encounter (Signed)
Pt states she went to her pcp, DR Welton Flakes, and she states she heard something that did not sound right, wanted to know if Dr Kirke Corin wants to see her sooner than September. Please call

## 2013-05-01 NOTE — Telephone Encounter (Signed)
Schedule her for next available appointment but not urgent.

## 2013-05-02 NOTE — Telephone Encounter (Signed)
Spoke with pt advised pt of Dr Jari Sportsman recommendation.  Moved f/u appt from 06/27/13 to 05/20/13. Pt aware of appt date and time.

## 2013-05-20 ENCOUNTER — Encounter: Payer: Self-pay | Admitting: Cardiovascular Disease

## 2013-05-20 ENCOUNTER — Ambulatory Visit (INDEPENDENT_AMBULATORY_CARE_PROVIDER_SITE_OTHER): Payer: Medicare Other | Admitting: Cardiovascular Disease

## 2013-05-20 VITALS — BP 123/77 | HR 74 | Ht 64.0 in | Wt 115.5 lb

## 2013-05-20 DIAGNOSIS — E785 Hyperlipidemia, unspecified: Secondary | ICD-10-CM

## 2013-05-20 DIAGNOSIS — R011 Cardiac murmur, unspecified: Secondary | ICD-10-CM

## 2013-05-20 DIAGNOSIS — R0602 Shortness of breath: Secondary | ICD-10-CM

## 2013-05-20 DIAGNOSIS — I2510 Atherosclerotic heart disease of native coronary artery without angina pectoris: Secondary | ICD-10-CM

## 2013-05-20 NOTE — Assessment & Plan Note (Signed)
She is doing well from a cardiac standpoint with no symptoms suggestive of angina, heart failure or arrhythmia. Continue medical therapy. I don't hear cardiac murmurs today. Given that there has not been a change in her symptoms, I will not pursue any further workup at this time.

## 2013-05-20 NOTE — Patient Instructions (Addendum)
Stop taking Niacin.  Continue other medications.  Follow up in 6 months.  

## 2013-05-20 NOTE — Assessment & Plan Note (Signed)
I recommend continuing treatment with rosuvastatin and stopping niacin given the recent negative HPS2 study which also showed potential long-term side effects related to niacin.

## 2013-05-20 NOTE — Progress Notes (Signed)
HPI  This is a 72 year old female who is here today for followup visit. She has known history of coronary artery disease status post drug-eluting stent placement to the left anterior descending artery in 2008. No cardiac events since then. Most recent stress test was in 2013 which showed no evidence of ischemia.  She continues to do well from a cardiac standpoint and denies any chest pain or worsening dyspnea. She had a prolonged problem with inability to gain weight and has been evaluated at [facility]. It appears that no significant etiology was found. She underwent  left knee replacement in 2013.  She was noted to have a possible cardiac murmur recently by Dr. Yves Dill. There has been no change in her exercise tolerance. She continues to mow the lawn and perform activities of daily living with no change in functional capacity. She has been under stress lately due to death of multiple friends and family members.  Allergies  Allergen Reactions  . Codeine   . Meloxicam   . Reclast [Zoledronic Acid]      Current Outpatient Prescriptions on File Prior to Visit  Medication Sig Dispense Refill  . Cholecalciferol (VITAMIN D) 2000 UNITS tablet Take 2,000 Units by mouth daily.        Marland Kitchen levothyroxine (SYNTHROID, LEVOTHROID) 75 MCG tablet Take 75 mcg by mouth daily.        . Multiple Vitamin (MULTIVITAMIN) tablet Take 1 tablet by mouth daily.        . niacin 500 MG tablet Take 500 mg by mouth daily with breakfast.        . Ondansetron HCl (ZOFRAN PO) Take by mouth daily.      . rosuvastatin (CRESTOR) 20 MG tablet Take 1 tablet (20 mg total) by mouth daily.  30 tablet  6  . traMADol (ULTRAM) 50 MG tablet Take 50 mg by mouth as needed.      . venlafaxine (EFFEXOR-XR) 75 MG 24 hr capsule Take 75 mg by mouth daily.         No current facility-administered medications on file prior to visit.     Past Medical History  Diagnosis Date  . Hypothyroidism   . CAD (coronary artery disease)   . HLD  (hyperlipidemia)   . Clotting disorder     LEGS  . Allergic rhinitis   . Mitral regurgitation   . Hypertension   . Sleep apnea      Past Surgical History  Procedure Laterality Date  . Thyroidectomy    . Coronary stent placement  07/2007    Mid LAD: 3.0 X 18 mm Xience stent  . Bunionette excision    . Cardiac catheterization  07/2007    99% mid LAD stenosis  . Total knee arthroplasty      left     Family History  Problem Relation Age of Onset  . Heart disease    . Colon cancer    . Lymphoma       History   Social History  . Marital Status: Widowed    Spouse Name: N/A    Number of Children: 1  . Years of Education: N/A   Occupational History  . retired    Social History Main Topics  . Smoking status: Former Smoker -- 0.10 packs/day for 2 years    Types: Cigarettes    Quit date: 10/04/1991  . Smokeless tobacco: Never Used     Comment: smoked on and off  . Alcohol Use: No  .  Drug Use: No  . Sexual Activity: Not on file   Other Topics Concern  . Not on file   Social History Narrative  . No narrative on file     PHYSICAL EXAM   BP 123/77  Ht 5\' 4"  (1.626 m)  Wt 115 lb 8 oz (52.39 kg)  BMI 19.82 kg/m2 Constitutional: She is oriented to person, place, and time. She appears well-developed and well-nourished. No distress.  HENT: No nasal discharge.  Head: Normocephalic and atraumatic.  Eyes: Pupils are equal and round. Right eye exhibits no discharge. Left eye exhibits no discharge.  Neck: Normal range of motion. Neck supple. No JVD present. No thyromegaly present.  Cardiovascular: Normal rate, regular rhythm, normal heart sounds. Exam reveals no gallop and no friction rub. No murmur heard.  Pulmonary/Chest: Effort normal and breath sounds normal. No stridor. No respiratory distress. She has no wheezes. She has no rales. She exhibits no tenderness.  Abdominal: Soft. Bowel sounds are normal. She exhibits no distension. There is no tenderness. There is no  rebound and no guarding.  Musculoskeletal: Normal range of motion. She exhibits no edema and no tenderness.  Neurological: She is alert and oriented to person, place, and time. Coordination normal.  Skin: Skin is warm and dry. No rash noted. She is not diaphoretic. No erythema. No pallor.  Psychiatric: She has a normal mood and affect. Her behavior is normal. Judgment and thought content normal.    EKG: Sinus  Rhythm  -Incomplete right bundle branch block.   ABNORMAL     ASSESSMENT AND PLAN

## 2013-06-27 ENCOUNTER — Ambulatory Visit: Payer: Medicare Other | Admitting: Cardiovascular Disease

## 2013-06-28 ENCOUNTER — Encounter: Payer: Self-pay | Admitting: Cardiovascular Disease

## 2013-06-28 ENCOUNTER — Ambulatory Visit (INDEPENDENT_AMBULATORY_CARE_PROVIDER_SITE_OTHER): Payer: Medicare Other | Admitting: Cardiovascular Disease

## 2013-06-28 VITALS — BP 133/73 | HR 78 | Ht 64.0 in | Wt 116.5 lb

## 2013-06-28 DIAGNOSIS — I770 Arteriovenous fistula, acquired: Secondary | ICD-10-CM

## 2013-06-28 DIAGNOSIS — I2510 Atherosclerotic heart disease of native coronary artery without angina pectoris: Secondary | ICD-10-CM

## 2013-06-28 NOTE — Patient Instructions (Addendum)
Your physician has requested that you have a lower or upper extremity arterial duplex. This test is an ultrasound of the arteries in the legs or arms. It looks at arterial blood flow in the legs and arms. Allow one hour for Lower and Upper Arterial scans. There are no restrictions or special instructions  Follow up after test.

## 2013-06-28 NOTE — Assessment & Plan Note (Signed)
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 Less likely to be a pseudoaneurysm. I recommend evaluation with left upper extremity arterial duplex. There is no strong indication to treat this at this time given the lack of discomfort or arm swelling.

## 2013-06-28 NOTE — Progress Notes (Signed)
Primary care physician: Dr. Yves Dill.    HPI  This is a 72 year old female who is here today for followup visit. She has known history of coronary artery disease status post drug-eluting stent placement to the left anterior descending artery in 2008. No cardiac events since then. Most recent stress test was in 2013 which showed no evidence of ischemia.  She continues to do well from a cardiac standpoint and denies any chest pain or worsening dyspnea.  She underwent  left knee replacement in 2013.  She noticed a small bulging mass in the left antecubital area. This started a few months ago and it has enlarged in size slightly since then. There is no associated discomfort. She denies any left arm pain or swelling. She is right-handed.  Allergies  Allergen Reactions  . Codeine   . Meloxicam   . Reclast [Zoledronic Acid]   . Zofran [Ondansetron]     BLOOD CLOT;caused cardiac arrest     Current Outpatient Prescriptions on File Prior to Visit  Medication Sig Dispense Refill  . aspirin 81 MG tablet Take 162 mg by mouth daily.      . Cholecalciferol (VITAMIN D) 2000 UNITS tablet Take 2,000 Units by mouth daily.        Marland Kitchen levothyroxine (SYNTHROID, LEVOTHROID) 75 MCG tablet Take 75 mcg by mouth daily.        . Multiple Vitamin (MULTIVITAMIN) tablet Take 1 tablet by mouth daily.        . Ondansetron HCl (ZOFRAN PO) Take by mouth daily.      . rosuvastatin (CRESTOR) 20 MG tablet Take 1 tablet (20 mg total) by mouth daily.  30 tablet  6  . traMADol (ULTRAM) 50 MG tablet Take 50 mg by mouth as needed.      . venlafaxine (EFFEXOR-XR) 75 MG 24 hr capsule Takes 1 1/2 tablets daily.       No current facility-administered medications on file prior to visit.     Past Medical History  Diagnosis Date  . Hypothyroidism   . CAD (coronary artery disease)   . HLD (hyperlipidemia)   . Allergic rhinitis   . Mitral regurgitation   . Hypertension   . Sleep apnea   . Clotting disorder     LEGS;throat      Past Surgical History  Procedure Laterality Date  . Thyroidectomy    . Coronary stent placement  07/2007    Mid LAD: 3.0 X 18 mm Xience stent  . Bunionette excision    . Cardiac catheterization  07/2007    99% mid LAD stenosis  . Total knee arthroplasty      left     Family History  Problem Relation Age of Onset  . Colon cancer    . Lymphoma    . Heart disease    . Heart disease Mother   . Hyperlipidemia Mother      History   Social History  . Marital Status: Widowed    Spouse Name: N/A    Number of Children: 1  . Years of Education: N/A   Occupational History  . retired    Social History Main Topics  . Smoking status: Former Smoker -- 0.10 packs/day for 2 years    Types: Cigarettes    Quit date: 10/04/1991  . Smokeless tobacco: Never Used     Comment: smoked on and off  . Alcohol Use: No  . Drug Use: No  . Sexual Activity: Not on file   Other  Topics Concern  . Not on file   Social History Narrative  . No narrative on file     PHYSICAL EXAM   BP 133/73  Pulse 78  Ht 5\' 4"  (1.626 m)  Wt 116 lb 8 oz (52.844 kg)  BMI 19.99 kg/m2 Constitutional: She is oriented to person, place, and time. She appears well-developed and well-nourished. No distress.  HENT: No nasal discharge.  Head: Normocephalic and atraumatic.  Eyes: Pupils are equal and round. Right eye exhibits no discharge. Left eye exhibits no discharge.  Neck: Normal range of motion. Neck supple. No JVD present. No thyromegaly present.  Cardiovascular: Normal rate, regular rhythm, normal heart sounds. Exam reveals no gallop and no friction rub. No murmur heard.  Pulmonary/Chest: Effort normal and breath sounds normal. No stridor. No respiratory distress. She has no wheezes. She has no rales. She exhibits no tenderness.  Abdominal: Soft. Bowel sounds are normal. She exhibits no distension. There is no tenderness. There is no rebound and no guarding.  Musculoskeletal: Normal range of motion.  She exhibits no edema and no tenderness.  Neurological: She is alert and oriented to person, place, and time. Coordination normal.  Skin: Skin is warm and dry. No rash noted. She is not diaphoretic. No erythema. No pallor.  Psychiatric: She has a normal mood and affect. Her behavior is normal. Judgment and thought content normal.  Left arm: There is a prominent pulsating vein in the left antecubital area with a thrill and a loud bruit. Distal pulses are intact. No arm swelling.  EKG: Sinus  Rhythm  -Incomplete right bundle branch block.   ABNORMAL     ASSESSMENT AND PLAN

## 2013-06-28 NOTE — Assessment & Plan Note (Signed)
She is stable from a cardiac standpoint with no recurrent angina.

## 2013-07-02 ENCOUNTER — Encounter (INDEPENDENT_AMBULATORY_CARE_PROVIDER_SITE_OTHER): Payer: Medicare Other

## 2013-07-02 DIAGNOSIS — R229 Localized swelling, mass and lump, unspecified: Secondary | ICD-10-CM

## 2013-07-02 DIAGNOSIS — M7989 Other specified soft tissue disorders: Secondary | ICD-10-CM

## 2013-07-02 DIAGNOSIS — I770 Arteriovenous fistula, acquired: Secondary | ICD-10-CM

## 2013-07-04 ENCOUNTER — Telehealth: Payer: Self-pay

## 2013-07-04 NOTE — Telephone Encounter (Signed)
Spoke w/ pt.   She verbalizes understanding.

## 2013-07-04 NOTE — Telephone Encounter (Signed)
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 This is not serious and likely can be monitored for now. No IVs or blood draws are allowed from the left arm.       She should keep her follow up appointment in few weeks to recheck this. ------

## 2013-07-12 ENCOUNTER — Ambulatory Visit (INDEPENDENT_AMBULATORY_CARE_PROVIDER_SITE_OTHER): Payer: Medicare Other | Admitting: Cardiovascular Disease

## 2013-07-12 ENCOUNTER — Encounter: Payer: Self-pay | Admitting: Cardiovascular Disease

## 2013-07-12 VITALS — BP 120/54 | HR 84 | Ht 64.0 in | Wt 115.0 lb

## 2013-07-12 DIAGNOSIS — I770 Arteriovenous fistula, acquired: Secondary | ICD-10-CM

## 2013-07-12 DIAGNOSIS — I2510 Atherosclerotic heart disease of native coronary artery without angina pectoris: Secondary | ICD-10-CM

## 2013-07-12 NOTE — Assessment & Plan Note (Signed)
Stable

## 2013-07-12 NOTE — Assessment & Plan Note (Signed)
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 This is asymptomatic at this time.  I recommend no IVs or blood draws  from the left arm. Continue to monitor for now. If Monica Downs becomes symptomatic, I will proceed with left upper extremity angiography and possible surgical ligation.

## 2013-07-12 NOTE — Progress Notes (Signed)
Primary care physician: Dr. Yves Dill.   HPI  This is a 72 year old female who is here today for followup visit. She has known history of coronary artery disease status post drug-eluting stent placement to the left anterior descending artery in 2008. No cardiac events since then. Most recent stress test was in 2013 which showed no evidence of ischemia.  She continues to do well from a cardiac standpoint and denies any chest pain or worsening dyspnea.  She underwent  left knee replacement in 2013.  She was seen recently for a small bulging mass in the left antecubital area with a bruit. 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 She denies any left arm discomfort or swelling.  Allergies  Allergen Reactions  . Codeine   . Meloxicam   . Reclast [Zoledronic Acid]   . Zofran [Ondansetron]     BLOOD CLOT;caused cardiac arrest     Current Outpatient Prescriptions on File Prior to Visit  Medication Sig Dispense Refill  . aspirin 81 MG tablet Take 162 mg by mouth daily.      . Cholecalciferol (VITAMIN D) 2000 UNITS tablet Take 2,000 Units by mouth daily.        Marland Kitchen denosumab (PROLIA) 60 MG/ML SOLN injection Inject 60 mg into the skin every 6 (six) months. Administer in upper arm, thigh, or abdomen      . levothyroxine (SYNTHROID, LEVOTHROID) 75 MCG tablet Take 75 mcg by mouth daily.        . Multiple Vitamin (MULTIVITAMIN) tablet Take 1 tablet by mouth daily.        . Ondansetron HCl (ZOFRAN PO) Take by mouth daily.      . potassium chloride (K-DUR) 10 MEQ tablet Take 10 mEq by mouth daily.      . rosuvastatin (CRESTOR) 20 MG tablet Take 1 tablet (20 mg total) by mouth daily.  30 tablet  6  . traMADol (ULTRAM) 50 MG tablet Take 50 mg by mouth as needed.      . venlafaxine (EFFEXOR-XR) 75 MG 24 hr capsule Takes 1 1/2 tablets daily.       No current facility-administered medications on file prior to visit.     Past Medical History  Diagnosis Date  . Hypothyroidism   . CAD  (coronary artery disease)   . HLD (hyperlipidemia)   . Allergic rhinitis   . Mitral regurgitation   . Hypertension   . Sleep apnea   . Clotting disorder     LEGS;throat     Past Surgical History  Procedure Laterality Date  . Thyroidectomy    . Coronary stent placement  07/2007    Mid LAD: 3.0 X 18 mm Xience stent  . Bunionette excision    . Cardiac catheterization  07/2007    99% mid LAD stenosis  . Total knee arthroplasty      left     Family History  Problem Relation Age of Onset  . Colon cancer    . Lymphoma    . Heart disease    . Heart disease Mother   . Hyperlipidemia Mother      History   Social History  . Marital Status: Widowed    Spouse Name: N/A    Number of Children: 1  . Years of Education: N/A   Occupational History  . retired    Social History Main Topics  . Smoking status: Former Smoker -- 0.10 packs/day for 2 years    Types: Cigarettes  Quit date: 10/04/1991  . Smokeless tobacco: Never Used     Comment: smoked on and off  . Alcohol Use: No  . Drug Use: No  . Sexual Activity: Not on file   Other Topics Concern  . Not on file   Social History Narrative  . No narrative on file     PHYSICAL EXAM   BP 120/54  Pulse 84  Ht 5\' 4"  (1.626 m)  Wt 115 lb (52.164 kg)  BMI 19.73 kg/m2 Constitutional: She is oriented to person, place, and time. She appears well-developed and well-nourished. No distress.  HENT: No nasal discharge.  Head: Normocephalic and atraumatic.  Eyes: Pupils are equal and round. Right eye exhibits no discharge. Left eye exhibits no discharge.  Neck: Normal range of motion. Neck supple. No JVD present. No thyromegaly present.  Cardiovascular: Normal rate, regular rhythm, normal heart sounds. Exam reveals no gallop and no friction rub. No murmur heard.  Pulmonary/Chest: Effort normal and breath sounds normal. No stridor. No respiratory distress. She has no wheezes. She has no rales. She exhibits no tenderness.    Abdominal: Soft. Bowel sounds are normal. She exhibits no distension. There is no tenderness. There is no rebound and no guarding.  Musculoskeletal: Normal range of motion. She exhibits no edema and no tenderness.  Neurological: She is alert and oriented to person, place, and time. Coordination normal.  Skin: Skin is warm and dry. No rash noted. She is not diaphoretic. No erythema. No pallor.  Psychiatric: She has a normal mood and affect. Her behavior is normal. Judgment and thought content normal.  Left arm: There is a prominent pulsating vein in the left antecubital area with a thrill and a loud bruit. Distal pulses are intact. No arm swelling.    ASSESSMENT AND PLAN

## 2013-07-12 NOTE — Patient Instructions (Signed)
Continue same medications.   Your physician wants you to follow-up in: 4 months.  You will receive a reminder letter in the mail two months in advance. If you don't receive a letter, please call our office to schedule the follow-up appointment.  

## 2013-08-01 ENCOUNTER — Ambulatory Visit: Payer: Self-pay | Admitting: Internal Medicine

## 2013-10-10 ENCOUNTER — Encounter: Payer: Self-pay | Admitting: Cardiovascular Disease

## 2013-10-10 ENCOUNTER — Ambulatory Visit (INDEPENDENT_AMBULATORY_CARE_PROVIDER_SITE_OTHER): Payer: Medicare Other | Admitting: Cardiovascular Disease

## 2013-10-10 VITALS — BP 121/72 | HR 80 | Ht 64.0 in | Wt 111.5 lb

## 2013-10-10 DIAGNOSIS — I2510 Atherosclerotic heart disease of native coronary artery without angina pectoris: Secondary | ICD-10-CM

## 2013-10-10 DIAGNOSIS — R079 Chest pain, unspecified: Secondary | ICD-10-CM

## 2013-10-10 NOTE — Progress Notes (Signed)
Primary care physician: Dr. Yves Dill.   HPI  This is a 73 year old female who is here today for followup visit. She has known history of coronary artery disease status post drug-eluting stent placement to the left anterior descending artery in 2008. No cardiac events since then. Most recent stress test was in 2013 which showed no evidence of ischemia.   She underwent  left knee replacement in 2013.  She was seen recently for a small bulging mass in the left antecubital area with a bruit. 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 She denies any left arm discomfort or swelling. Over the last few weeks, she had intermittent episodes of chest discomfort described as stinging sensation mainly with activities and not at rest. This lasts for a few minutes and resolves with rest. She's been having associated nausea. The discomfort does not radiate. She has been under stress recently due to an expected death of her brother-in-law and also she is having problems with her neighbor.  Allergies  Allergen Reactions  . Codeine   . Meloxicam   . Reclast [Zoledronic Acid]   . Zofran [Ondansetron]     BLOOD CLOT;caused cardiac arrest     Current Outpatient Prescriptions on File Prior to Visit  Medication Sig Dispense Refill  . aspirin 81 MG tablet Take 162 mg by mouth daily.      . Cholecalciferol (VITAMIN D) 2000 UNITS tablet Take 2,000 Units by mouth daily.        Marland Kitchen denosumab (PROLIA) 60 MG/ML SOLN injection Inject 60 mg into the skin every 6 (six) months. Administer in upper arm, thigh, or abdomen      . levothyroxine (SYNTHROID, LEVOTHROID) 75 MCG tablet Take 75 mcg by mouth daily.        . Multiple Vitamin (MULTIVITAMIN) tablet Take 1 tablet by mouth daily.        . Ondansetron HCl (ZOFRAN PO) Take by mouth daily.      . potassium chloride (K-DUR) 10 MEQ tablet Take 10 mEq by mouth daily.      . rosuvastatin (CRESTOR) 20 MG tablet Take 1 tablet (20 mg total) by mouth daily.  30  tablet  6  . traMADol (ULTRAM) 50 MG tablet Take 50 mg by mouth as needed.      . venlafaxine (EFFEXOR-XR) 75 MG 24 hr capsule Takes 1 1/2 tablets daily.       No current facility-administered medications on file prior to visit.     Past Medical History  Diagnosis Date  . Hypothyroidism   . CAD (coronary artery disease)   . HLD (hyperlipidemia)   . Allergic rhinitis   . Mitral regurgitation   . Hypertension   . Sleep apnea   . Clotting disorder     LEGS;throat     Past Surgical History  Procedure Laterality Date  . Thyroidectomy    . Coronary stent placement  07/2007    Mid LAD: 3.0 X 18 mm Xience stent  . Bunionette excision    . Cardiac catheterization  07/2007    99% mid LAD stenosis  . Total knee arthroplasty      left     Family History  Problem Relation Age of Onset  . Colon cancer    . Lymphoma    . Heart disease    . Heart disease Mother   . Hyperlipidemia Mother      History   Social History  . Marital Status: Widowed  Spouse Name: N/A    Number of Children: 1  . Years of Education: N/A   Occupational History  . retired    Social History Main Topics  . Smoking status: Former Smoker -- 0.10 packs/day for 2 years    Types: Cigarettes    Quit date: 10/04/1991  . Smokeless tobacco: Never Used     Comment: smoked on and off  . Alcohol Use: No  . Drug Use: No  . Sexual Activity: Not on file   Other Topics Concern  . Not on file   Social History Narrative  . No narrative on file     PHYSICAL EXAM   BP 121/72  Pulse 80  Ht 5\' 4"  (1.626 m)  Wt 111 lb 8 oz (50.576 kg)  BMI 19.13 kg/m2 Constitutional: She is oriented to person, place, and time. She appears well-developed and well-nourished. No distress.  HENT: No nasal discharge.  Head: Normocephalic and atraumatic.  Eyes: Pupils are equal and round. Right eye exhibits no discharge. Left eye exhibits no discharge.  Neck: Normal range of motion. Neck supple. No JVD present. No  thyromegaly present.  Cardiovascular: Normal rate, regular rhythm, normal heart sounds. Exam reveals no gallop and no friction rub. No murmur heard.  Pulmonary/Chest: Effort normal and breath sounds normal. No stridor. No respiratory distress. She has no wheezes. She has no rales. She exhibits no tenderness.  Abdominal: Soft. Bowel sounds are normal. She exhibits no distension. There is no tenderness. There is no rebound and no guarding.  Musculoskeletal: Normal range of motion. She exhibits no edema and no tenderness.  Neurological: She is alert and oriented to person, place, and time. Coordination normal.  Skin: Skin is warm and dry. No rash noted. She is not diaphoretic. No erythema. No pallor.  Psychiatric: She has a normal mood and affect. Her behavior is normal. Judgment and thought content normal.  Left arm: There is a prominent pulsating vein in the left antecubital area with a thrill and a loud bruit. Distal pulses are intact but left radial pulse is diminished. No arm swelling.  EKG: Normal sinus rhythm with incomplete right bundle branch block and prolonged QT interval  ASSESSMENT AND PLAN

## 2013-10-10 NOTE — Patient Instructions (Addendum)
[facility] MYOVIEW  Your caregiver has ordered a Stress Test with nuclear imaging. The purpose of this test is to evaluate the blood supply to your heart muscle. This procedure is referred to as a "Non-Invasive Stress Test." This is because other than having an IV started in your vein, nothing is inserted or "invades" your body. Cardiac stress tests are done to find areas of poor blood flow to the heart by determining the extent of coronary artery disease (CAD). Some patients exercise on a treadmill, which naturally increases the blood flow to your heart, while others who are  unable to walk on a treadmill due to physical limitations have a pharmacologic/chemical stress agent called Lexiscan . This medicine will mimic walking on a treadmill by temporarily increasing your coronary blood flow.   Please note: these test may take anywhere between 2-4 hours to complete  PLEASE REPORT TO [facility]  THE VOLUNTEERS AT THE FIRST DESK WILL DIRECT YOU WHERE TO GO  Date of Procedure:___________1/16/15___________________  Arrival Time for Procedure:__________7:45 AM____________________   PLEASE NOTIFY THE OFFICE AT LEAST 24 HOURS IN ADVANCE IF YOU ARE UNABLE TO KEEP YOUR APPOINTMENT.  [phone] AND  PLEASE NOTIFY [facility] AT [facility] AT LEAST 24 HOURS IN ADVANCE IF YOU ARE UNABLE TO KEEP YOUR APPOINTMENT. [phone]  How to prepare for your Myoview test:  1. Do not eat or drink after midnight 2. No caffeine for 24 hours prior to test 3. No smoking 24 hours prior to test. 4. Your medication may be taken with water.  If your doctor stopped a medication because of this test, do not take that medication. 5. Ladies, please do not wear dresses.  Skirts or pants are appropriate. Please wear a short sleeve shirt. 6. No perfume, cologne or lotion. 7. Wear comfortable walking shoes. No heels!   Your physician recommends that you schedule a follow-up appointment in: 4  months

## 2013-10-10 NOTE — Assessment & Plan Note (Signed)
The patient has known history of coronary artery disease with previous drug-eluting stent placement to the left anterior descending artery. She is currently having exertional chest pain which is worrisome for angina. I recommend proceeding with a treadmill nuclear stress test for evaluation.

## 2013-10-10 NOTE — Assessment & Plan Note (Signed)
Continue treatment with Crestor. 

## 2013-10-10 NOTE — Assessment & Plan Note (Signed)
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 She continues to be asymptomatic.  I recommend no IVs or blood draws  from the left arm. Continue to monitor for now. If she becomes symptomatic, I will proceed with left upper extremity angiography and possible surgical ligation.

## 2013-10-18 ENCOUNTER — Ambulatory Visit: Payer: Self-pay | Admitting: Cardiovascular Disease

## 2013-10-18 ENCOUNTER — Other Ambulatory Visit: Payer: Self-pay

## 2013-10-18 DIAGNOSIS — R079 Chest pain, unspecified: Secondary | ICD-10-CM

## 2013-11-14 ENCOUNTER — Encounter: Payer: Self-pay | Admitting: Cardiovascular Disease

## 2013-11-14 ENCOUNTER — Ambulatory Visit (INDEPENDENT_AMBULATORY_CARE_PROVIDER_SITE_OTHER): Payer: Medicare Other | Admitting: Cardiovascular Disease

## 2013-11-14 VITALS — BP 98/60 | HR 92 | Ht 64.0 in | Wt 112.0 lb

## 2013-11-14 DIAGNOSIS — I2510 Atherosclerotic heart disease of native coronary artery without angina pectoris: Secondary | ICD-10-CM

## 2013-11-14 DIAGNOSIS — E785 Hyperlipidemia, unspecified: Secondary | ICD-10-CM

## 2013-11-14 DIAGNOSIS — I770 Arteriovenous fistula, acquired: Secondary | ICD-10-CM

## 2013-11-14 NOTE — Assessment & Plan Note (Signed)
Continue treatment with Crestor with target LDL of less than 70. 

## 2013-11-14 NOTE — Assessment & Plan Note (Signed)
She is doing very well with no recurrent chest pain. Recent nuclear stress test was normal.

## 2013-11-14 NOTE — Patient Instructions (Signed)
Continue same medications.   Your physician wants you to follow-up in: 6 months.  You will receive a reminder letter in the mail two months in advance. If you don't receive a letter, please call our office to schedule the follow-up appointment.  

## 2013-11-14 NOTE — Progress Notes (Signed)
Primary care physician: Dr. Yves DillNeelam Khan.   HPI  This is a 73 year old female who is here today for followup visit. She has known history of coronary artery disease status post drug-eluting stent placement to the left anterior descending artery in 2008. No cardiac events since then. She underwent a treadmill nuclear stress test recently due to atypical chest pain. It showed no evidence of ischemia with normal ejection fraction. She has not had any more episodes of chest discomfort.  She underwent  left knee replacement in 2013.  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 She denies any left arm discomfort or swelling. She has been doing well and denies any chest pain or dyspnea.  Allergies  Allergen Reactions  . Codeine   . Meloxicam   . Reclast [Zoledronic Acid]   . Zofran [Ondansetron]     BLOOD CLOT;caused cardiac arrest     Current Outpatient Prescriptions on File Prior to Visit  Medication Sig Dispense Refill  . aspirin 81 MG tablet Take 162 mg by mouth daily.      . Cholecalciferol (VITAMIN D) 2000 UNITS tablet Take by mouth. Takes 3 tablets daily.      Marland Kitchen. denosumab (PROLIA) 60 MG/ML SOLN injection Inject 60 mg into the skin every 6 (six) months. Administer in upper arm, thigh, or abdomen      . levothyroxine (SYNTHROID, LEVOTHROID) 75 MCG tablet Take 75 mcg by mouth daily.        . Multiple Vitamin (MULTIVITAMIN) tablet Take 1 tablet by mouth daily.        . Ondansetron HCl (ZOFRAN PO) Take by mouth daily.      . potassium chloride (K-DUR) 10 MEQ tablet Take 10 mEq by mouth daily.      . rosuvastatin (CRESTOR) 20 MG tablet Take 1 tablet (20 mg total) by mouth daily.  30 tablet  6  . traMADol (ULTRAM) 50 MG tablet Take 50 mg by mouth as needed.      . venlafaxine (EFFEXOR-XR) 75 MG 24 hr capsule Takes 1 1/2 tablets daily.       No current facility-administered medications on file prior to visit.     Past Medical History    Diagnosis Date  . Hypothyroidism   . CAD (coronary artery disease)   . HLD (hyperlipidemia)   . Allergic rhinitis   . Mitral regurgitation   . Hypertension   . Sleep apnea   . Clotting disorder     LEGS;throat     Past Surgical History  Procedure Laterality Date  . Thyroidectomy    . Coronary stent placement  07/2007    Mid LAD: 3.0 X 18 mm Xience stent  . Bunionette excision    . Cardiac catheterization  07/2007    99% mid LAD stenosis  . Total knee arthroplasty      left  . Multiple tooth extractions       Family History  Problem Relation Age of Onset  . Colon cancer    . Lymphoma    . Heart disease    . Heart disease Mother   . Hyperlipidemia Mother      History   Social History  . Marital Status: Widowed    Spouse Name: N/A    Number of Children: 1  . Years of Education: N/A   Occupational History  . retired    Social History Main Topics  . Smoking status: Former Smoker --  0.10 packs/day for 2 years    Types: Cigarettes    Quit date: 10/04/1991  . Smokeless tobacco: Never Used     Comment: smoked on and off  . Alcohol Use: No  . Drug Use: No  . Sexual Activity: Not on file   Other Topics Concern  . Not on file   Social History Narrative  . No narrative on file     PHYSICAL EXAM   BP 98/60  Pulse 92  Ht 5\' 4"  (1.626 m)  Wt 112 lb (50.803 kg)  BMI 19.22 kg/m2 Constitutional: She is oriented to person, place, and time. She appears well-developed and well-nourished. No distress.  HENT: No nasal discharge.  Head: Normocephalic and atraumatic.  Eyes: Pupils are equal and round. Right eye exhibits no discharge. Left eye exhibits no discharge.  Neck: Normal range of motion. Neck supple. No JVD present. No thyromegaly present.  Cardiovascular: Normal rate, regular rhythm, normal heart sounds. Exam reveals no gallop and no friction rub. No murmur heard.  Pulmonary/Chest: Effort normal and breath sounds normal. No stridor. No respiratory  distress. She has no wheezes. She has no rales. She exhibits no tenderness.  Abdominal: Soft. Bowel sounds are normal. She exhibits no distension. There is no tenderness. There is no rebound and no guarding.  Musculoskeletal: Normal range of motion. She exhibits no edema and no tenderness.  Neurological: She is alert and oriented to person, place, and time. Coordination normal.  Skin: Skin is warm and dry. No rash noted. She is not diaphoretic. No erythema. No pallor.  Psychiatric: She has a normal mood and affect. Her behavior is normal. Judgment and thought content normal.  Left arm: There is a prominent pulsating vein in the left antecubital area with a thrill and a loud bruit. Distal pulses are intact but left radial pulse is diminished. No arm swelling.    ASSESSMENT AND PLAN

## 2013-11-14 NOTE — Assessment & Plan Note (Signed)
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 She continues to be asymptomatic.  I recommend no IVs or blood draws  from the left arm. Continue to monitor for now. If she becomes symptomatic, surgical ligation might be needed.      

## 2013-11-27 ENCOUNTER — Ambulatory Visit: Payer: Self-pay | Admitting: Otolaryngology

## 2014-05-23 ENCOUNTER — Ambulatory Visit (INDEPENDENT_AMBULATORY_CARE_PROVIDER_SITE_OTHER): Payer: Medicare Other | Admitting: Cardiovascular Disease

## 2014-05-23 ENCOUNTER — Encounter: Payer: Self-pay | Admitting: Cardiovascular Disease

## 2014-05-23 VITALS — BP 116/71 | HR 77 | Ht 64.0 in | Wt 113.5 lb

## 2014-05-23 DIAGNOSIS — I770 Arteriovenous fistula, acquired: Secondary | ICD-10-CM

## 2014-05-23 DIAGNOSIS — I209 Angina pectoris, unspecified: Secondary | ICD-10-CM

## 2014-05-23 DIAGNOSIS — I25119 Atherosclerotic heart disease of native coronary artery with unspecified angina pectoris: Secondary | ICD-10-CM

## 2014-05-23 DIAGNOSIS — E785 Hyperlipidemia, unspecified: Secondary | ICD-10-CM

## 2014-05-23 DIAGNOSIS — R0602 Shortness of breath: Secondary | ICD-10-CM

## 2014-05-23 DIAGNOSIS — I2510 Atherosclerotic heart disease of native coronary artery without angina pectoris: Secondary | ICD-10-CM

## 2014-05-23 NOTE — Assessment & Plan Note (Signed)
Continue treatment with Crestor with a target LDL of less than 70. 

## 2014-05-23 NOTE — Patient Instructions (Signed)
Continue same medications.   Your physician wants you to follow-up in: 6 months.  You will receive a reminder letter in the mail two months in advance. If you don't receive a letter, please call our office to schedule the follow-up appointment.  

## 2014-05-23 NOTE — Assessment & Plan Note (Signed)
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 She continues to be asymptomatic.  I recommend no IVs or blood draws  from the left arm. Continue to monitor for now. If she becomes symptomatic, surgical ligation might be needed.      

## 2014-05-23 NOTE — Progress Notes (Signed)
Primary care physician: Dr. Yves DillNeelam Khan.   HPI  This is a 73 year old female who is here today for followup visit. She has known history of coronary artery disease status post drug-eluting stent placement to the left anterior descending artery in 2008. No cardiac events since then. She underwent a treadmill nuclear stress test recently due to atypical chest pain. It showed no evidence of ischemia with normal ejection fraction. She has not had any more episodes of chest discomfort.  She underwent  left knee replacement in 2013.  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 She denies any left arm discomfort or swelling. She has been doing well and denies any chest pain or dyspnea.  Allergies  Allergen Reactions  . Codeine   . Meloxicam   . Reclast [Zoledronic Acid]   . Zofran [Ondansetron]     BLOOD CLOT;caused cardiac arrest     Current Outpatient Prescriptions on File Prior to Visit  Medication Sig Dispense Refill  . aspirin 81 MG tablet Take 162 mg by mouth daily.      . Cholecalciferol (VITAMIN D) 2000 UNITS tablet Take by mouth. Takes 3 tablets daily.      Marland Kitchen. denosumab (PROLIA) 60 MG/ML SOLN injection Inject 60 mg into the skin every 6 (six) months. Administer in upper arm, thigh, or abdomen      . levothyroxine (SYNTHROID, LEVOTHROID) 75 MCG tablet Take 75 mcg by mouth daily.        . Multiple Vitamin (MULTIVITAMIN) tablet Take 1 tablet by mouth daily.        . Ondansetron HCl (ZOFRAN PO) Take by mouth daily.      . potassium chloride (K-DUR) 10 MEQ tablet Take 10 mEq by mouth daily.      . rosuvastatin (CRESTOR) 20 MG tablet Take 1 tablet (20 mg total) by mouth daily.  30 tablet  6  . traMADol (ULTRAM) 50 MG tablet Take 50 mg by mouth as needed.      . venlafaxine (EFFEXOR-XR) 75 MG 24 hr capsule Takes 1 1/2 tablets daily.       No current facility-administered medications on file prior to visit.     Past Medical History    Diagnosis Date  . Hypothyroidism   . CAD (coronary artery disease)   . HLD (hyperlipidemia)   . Allergic rhinitis   . Mitral regurgitation   . Hypertension   . Sleep apnea   . Clotting disorder     LEGS;throat     Past Surgical History  Procedure Laterality Date  . Thyroidectomy    . Coronary stent placement  07/2007    Mid LAD: 3.0 X 18 mm Xience stent  . Bunionette excision    . Cardiac catheterization  07/2007    99% mid LAD stenosis  . Total knee arthroplasty      left  . Multiple tooth extractions       Family History  Problem Relation Age of Onset  . Colon cancer    . Lymphoma    . Heart disease    . Heart disease Mother   . Hyperlipidemia Mother      History   Social History  . Marital Status: Widowed    Spouse Name: N/A    Number of Children: 1  . Years of Education: N/A   Occupational History  . retired    Social History Main Topics  . Smoking status: Former Smoker --  0.10 packs/day for 2 years    Types: Cigarettes    Quit date: 10/04/1991  . Smokeless tobacco: Never Used     Comment: smoked on and off  . Alcohol Use: No  . Drug Use: No  . Sexual Activity: Not on file   Other Topics Concern  . Not on file   Social History Narrative  . No narrative on file     PHYSICAL EXAM   BP 116/71  Ht 5\' 4"  (1.626 m)  Wt 113 lb 8 oz (51.483 kg)  BMI 19.47 kg/m2 Constitutional: She is oriented to person, place, and time. She appears well-developed and well-nourished. No distress.  HENT: No nasal discharge.  Head: Normocephalic and atraumatic.  Eyes: Pupils are equal and round. Right eye exhibits no discharge. Left eye exhibits no discharge.  Neck: Normal range of motion. Neck supple. No JVD present. No thyromegaly present.  Cardiovascular: Normal rate, regular rhythm, normal heart sounds. Exam reveals no gallop and no friction rub. No murmur heard.  Pulmonary/Chest: Effort normal and breath sounds normal. No stridor. No respiratory distress.  She has no wheezes. She has no rales. She exhibits no tenderness.  Abdominal: Soft. Bowel sounds are normal. She exhibits no distension. There is no tenderness. There is no rebound and no guarding.  Musculoskeletal: Normal range of motion. She exhibits no edema and no tenderness.  Neurological: She is alert and oriented to person, place, and time. Coordination normal.  Skin: Skin is warm and dry. No rash noted. She is not diaphoretic. No erythema. No pallor.  Psychiatric: She has a normal mood and affect. Her behavior is normal. Judgment and thought content normal.  Left arm: There is a prominent pulsating vein in the left antecubital area with a thrill and a loud bruit. Distal pulses are intact but left radial pulse is diminished. No arm swelling.  EKG: Sinus  Rhythm  -Incomplete right bundle branch block.   ABNORMAL    ASSESSMENT AND PLAN

## 2014-05-23 NOTE — Assessment & Plan Note (Signed)
She is doing very well with no symptoms suggestive of angina. Continue medical therapy. 

## 2014-08-04 ENCOUNTER — Ambulatory Visit: Payer: Self-pay | Admitting: Internal Medicine

## 2014-09-08 ENCOUNTER — Ambulatory Visit (INDEPENDENT_AMBULATORY_CARE_PROVIDER_SITE_OTHER): Payer: Medicare Other | Admitting: Nurse Practitioner

## 2014-09-08 ENCOUNTER — Encounter: Payer: Self-pay | Admitting: Nurse Practitioner

## 2014-09-08 VITALS — BP 102/58 | HR 78 | Ht 66.0 in | Wt 112.2 lb

## 2014-09-08 DIAGNOSIS — I2510 Atherosclerotic heart disease of native coronary artery without angina pectoris: Secondary | ICD-10-CM

## 2014-09-08 DIAGNOSIS — E785 Hyperlipidemia, unspecified: Secondary | ICD-10-CM

## 2014-09-08 DIAGNOSIS — I25118 Atherosclerotic heart disease of native coronary artery with other forms of angina pectoris: Secondary | ICD-10-CM

## 2014-09-08 DIAGNOSIS — R5383 Other fatigue: Secondary | ICD-10-CM

## 2014-09-08 DIAGNOSIS — R42 Dizziness and giddiness: Secondary | ICD-10-CM

## 2014-09-08 NOTE — Patient Instructions (Addendum)
[facility] MYOVIEW  Your caregiver has ordered a Stress Test with nuclear imaging. The purpose of this test is to evaluate the blood supply to your heart muscle. This procedure is referred to as a "Non-Invasive Stress Test." This is because other than having an IV started in your vein, nothing is inserted or "invades" your body. Cardiac stress tests are done to find areas of poor blood flow to the heart by determining the extent of coronary artery disease (CAD). Some patients exercise on a treadmill, which naturally increases the blood flow to your heart, while others who are  unable to walk on a treadmill due to physical limitations have a pharmacologic/chemical stress agent called Lexiscan . This medicine will mimic walking on a treadmill by temporarily increasing your coronary blood flow.   Please note: these test may take anywhere between 2-4 hours to complete  PLEASE REPORT TO [facility]  THE VOLUNTEERS AT THE FIRST DESK WILL DIRECT YOU WHERE TO GO  Date of Procedure:____________12/18/15_________________________  Arrival Time for Procedure:______0745 am________________________  Instructions regarding medication:     PLEASE NOTIFY THE OFFICE AT LEAST 24 HOURS IN ADVANCE IF YOU ARE UNABLE TO KEEP YOUR APPOINTMENT.  [phone] AND  PLEASE NOTIFY [facility] AT [facility] AT LEAST 24 HOURS IN ADVANCE IF YOU ARE UNABLE TO KEEP YOUR APPOINTMENT. [phone]  How to prepare for your Myoview test:  1. Do not eat or drink after midnight 2. No caffeine for 24 hours prior to test 3. No smoking 24 hours prior to test. 4. Your medication may be taken with water.  If your doctor stopped a medication because of this test, do not take that medication. 5. Ladies, please do not wear dresses.  Skirts or pants are appropriate. Please wear a short sleeve shirt. 6. No perfume, cologne or lotion. 7. Wear comfortable walking shoes. No heels!       Your physician recommends that you schedule  a follow-up appointment in:  1 month with Dr. Kirke CorinArida

## 2014-09-08 NOTE — Progress Notes (Signed)
Patient Name: Monica NonesLinda S Wessells Date of Encounter: 09/08/2014  Primary Care Provider:  Margaretann LovelessKHAN,NEELAM S, MD Primary Cardiologist:  Judie PetitM. Kirke CorinArida, MD   Patient Profile  73 y/o female with a h/o CAD who presents today 2/2 progressive exertional fatigue (possible anginal equivalent).  Problem List   Past Medical History  Diagnosis Date  . Hypothyroidism   . CAD (coronary artery disease)     a. 07/2007 Cath/PCI: LM nl, LAD 99p (3.0 x 18 Xience EES), D1 90ost, LCX nl, RCA nl;  b. 10/2013 MV: EF 72%, attenuation artifact, no ischemia.  Marland Kitchen. HLD (hyperlipidemia)   . Allergic rhinitis   . Mitral regurgitation     a. 02/2011 Echo: EF 72%, trace TR, mild MR/AI.  Marland Kitchen. Hypertension   . Sleep apnea   . Clotting disorder     LEGS;throat  . Left Forearm AV Fistula     a. Asymptomatic.  No blood draws/IV's;  b. 06/2013 confirmed by L FA duplex.  . Incontinence    Past Surgical History  Procedure Laterality Date  . Thyroidectomy    . Coronary stent placement  07/2007    Mid LAD: 3.0 X 18 mm Xience stent  . Bunionette excision    . Cardiac catheterization  07/2007    99% mid LAD stenosis  . Total knee arthroplasty      left  . Multiple tooth extractions      Allergies  Allergies  Allergen Reactions  . Codeine   . Meloxicam   . Reclast [Zoledronic Acid]   . Zofran [Ondansetron]     BLOOD CLOT;caused cardiac arrest    HPI  73 y/o female with the above problem list.  She is s/p PCI/DES to the LAD in 2008.  Her last stress test was in 10/2013 was done 2/2 stinging c/p and was normal.  She's done well over the course of the year but over the past 5-6 wks, she's been noticing exertional fatigue w/o chest pain or dyspnea.  These Ss are reminiscent of prior anginal equivalent.  Yesterday she had an episode of exertional fatigue associated with lightheadedness while walking across her yard and had to stop to gather herself.  Ss resolved within a few mins.  She saw her PCP this AM and labs were apparently  drawn and she was advised to see us today.  She does not currently have any complaints.  Of note, she fell the other night and struck the right side of her head.  Her PCP just had a head CT performed this AM and those results are pending.  She did not lose consciousness prior to falling but according to her dtr, she thinks that she did after striking her head.  Home Medications  Prior to Admission medications   Medication Sig Start Date End Date Taking? Authorizing Provider  aspirin 81 MG tablet Take 162 mg by mouth daily.   Yes Historical Provider, MD  Cholecalciferol (VITAMIN D) 2000 UNITS tablet Take by mouth. Takes 3 tablets daily.   Yes Historical Provider, MD  denosumab (PROLIA) 60 MG/ML SOLN injection Inject 60 mg into the skin every 6 (six) months. Administer in upper arm, thigh, or abdomen   Yes Historical Provider, MD  levothyroxine (SYNTHROID, LEVOTHROID) 75 MCG tablet Take 75 mcg by mouth daily.     Yes Historical Provider, MD  Multiple Vitamin (MULTIVITAMIN) tablet Take 1 tablet by mouth daily.     Yes Historical Provider, MD  Ondansetron HCl (ZOFRAN PO) Take by mouth daily.  Yes Historical Provider, MD  potassium chloride (K-DUR) 10 MEQ tablet Take 10 mEq by mouth daily.   Yes Historical Provider, MD  rosuvastatin (CRESTOR) 20 MG tablet Take 1 tablet (20 mg total) by mouth daily. 07/21/11 09/08/14 Yes Iran OuchMuhammad A Arida, MD  traMADol (ULTRAM) 50 MG tablet Take 50 mg by mouth as needed.   Yes Historical Provider, MD  venlafaxine (EFFEXOR-XR) 75 MG 24 hr capsule Takes 1 1/2 tablets daily.   Yes Historical Provider, MD    Review of Systems  Exertional fatigue over the past 5-6 wks as outlined above.  She's had intermittent nose bleeds on the right.  She denies chest pain, palpitations, dyspnea, pnd, orthopnea, n, v, dizziness, syncope, edema, weight gain, or early satiety.   No brbpr/melena or other evidence of bleeding (besides nose bleeds).  All other systems reviewed and are  otherwise negative except as noted above.  Physical Exam  Blood pressure 102/58, pulse 78, height 5\' 6"  (1.676 m), weight 112 lb 4 oz (50.916 kg).  General: Pleasant, NAD Psych: Normal affect. Neuro: Alert and oriented X 3. Moves all extremities spontaneously. HEENT: Normal  Neck: Supple without bruits or JVD. Lungs:  Resp regular and unlabored, CTA. Heart: RRR no s3, s4, or murmurs. Abdomen: Soft, non-tender, non-distended, BS + x 4.  Extremities: No clubbing, cyanosis or edema. DP/PT/Radials 2+ and equal bilaterally.  Accessory Clinical Findings  ECG - RSR, 78, inc rbbb, no acute st/t changes.  Assessment & Plan  1.  Exertional Fatigue/CAD:  Pt presents with a 5-6 wk h/o exertional fatigue w/o chest pain or dyspnea, which is similar to prior anginal equivalent.  She did have a nuc study done earlier this year 2/2 atypical, stinging c/p, and it was non-ischemic.  In light of developing Ss reminiscent of prior anginal equivalent, I will order a repeat lexiscan MV to reassess for ischemia.  Her PCP just drew lab work today, so I've asked Ms. Broadus JohnWarren to await those labs prior to scheduling MV, in case a lab abnormality is found that may explain her Ss (? Anemia/hypothyroid).  Cont asa/statin.  2.  HL:  Cont statin.  This is followed by her PCP.  3. Dispo:  Lexi MV;  F/U Dr. Kirke CorinArida in 4 wks or sooner if necessary.   Nicolasa Duckinghristopher Magda Muise, NP 09/08/2014, 4:24 PM

## 2014-09-19 ENCOUNTER — Ambulatory Visit: Payer: Self-pay | Admitting: Nurse Practitioner

## 2014-09-19 DIAGNOSIS — R0602 Shortness of breath: Secondary | ICD-10-CM

## 2014-10-08 ENCOUNTER — Other Ambulatory Visit: Payer: Self-pay

## 2014-10-08 DIAGNOSIS — R5383 Other fatigue: Secondary | ICD-10-CM

## 2014-10-08 DIAGNOSIS — E785 Hyperlipidemia, unspecified: Secondary | ICD-10-CM

## 2014-10-09 ENCOUNTER — Ambulatory Visit (INDEPENDENT_AMBULATORY_CARE_PROVIDER_SITE_OTHER): Payer: Medicare Other | Admitting: Cardiovascular Disease

## 2014-10-09 ENCOUNTER — Encounter: Payer: Self-pay | Admitting: Cardiovascular Disease

## 2014-10-09 VITALS — BP 118/66 | HR 88 | Ht 64.0 in | Wt 111.0 lb

## 2014-10-09 DIAGNOSIS — E785 Hyperlipidemia, unspecified: Secondary | ICD-10-CM

## 2014-10-09 DIAGNOSIS — I2510 Atherosclerotic heart disease of native coronary artery without angina pectoris: Secondary | ICD-10-CM

## 2014-10-09 NOTE — Patient Instructions (Signed)
Your stress test was normal.   Continue same medications.   Your physician wants you to follow-up in: 6 months.  You will receive a reminder letter in the mail two months in advance. If you don't receive a letter, please call our office to schedule the follow-up appointment.

## 2014-10-09 NOTE — Assessment & Plan Note (Signed)
She is feeling better overall. Recent stress test was normal. Continue medical therapy.

## 2014-10-09 NOTE — Assessment & Plan Note (Signed)
Continue treatment with Crestor with a target LDL of less than 70. 

## 2014-10-09 NOTE — Progress Notes (Signed)
Primary care physician: Dr. Yves Dill.   HPI  This is a 74 year old female who is here today for followup visit. She has known history of coronary artery disease status post drug-eluting stent placement to the left anterior descending artery in 2008. No cardiac events since then. She underwent a treadmill nuclear stress test recently due to atypical chest pain. It showed no evidence of ischemia with normal ejection fraction. She has not had any more episodes of chest discomfort.  She underwent  left knee replacement in 2013.  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 She denies any left arm discomfort or swelling. She was seen last month for exertional fatigue and dyspnea. She underwent a nuclear stress test which showed no evidence of ischemia with normal ejection fraction. She is feeling better overall.  Allergies  Allergen Reactions  . Codeine   . Meloxicam   . Reclast [Zoledronic Acid]   . Zofran [Ondansetron]     BLOOD CLOT;caused cardiac arrest     Current Outpatient Prescriptions on File Prior to Visit  Medication Sig Dispense Refill  . aspirin 81 MG tablet Take 162 mg by mouth daily.    . Cholecalciferol (VITAMIN D) 2000 UNITS tablet Take by mouth. Takes 3 tablets daily.    Marland Kitchen denosumab (PROLIA) 60 MG/ML SOLN injection Inject 60 mg into the skin every 6 (six) months. Administer in upper arm, thigh, or abdomen    . levothyroxine (SYNTHROID, LEVOTHROID) 75 MCG tablet Take 75 mcg by mouth daily.      . Multiple Vitamin (MULTIVITAMIN) tablet Take 1 tablet by mouth daily.      . Ondansetron HCl (ZOFRAN PO) Take by mouth daily.    . potassium chloride (K-DUR) 10 MEQ tablet Take 10 mEq by mouth daily.    . rosuvastatin (CRESTOR) 20 MG tablet Take 1 tablet (20 mg total) by mouth daily. 30 tablet 6  . traMADol (ULTRAM) 50 MG tablet Take 50 mg by mouth as needed.    . venlafaxine (EFFEXOR-XR) 75 MG 24 hr capsule Takes 1 1/2 tablets  daily.     No current facility-administered medications on file prior to visit.     Past Medical History  Diagnosis Date  . Hypothyroidism   . CAD (coronary artery disease)     a. 07/2007 Cath/PCI: LM nl, LAD 99p (3.0 x 18 Xience EES), D1 90ost, LCX nl, RCA nl;  b. 10/2013 MV: EF 72%, attenuation artifact, no ischemia.  Marland Kitchen HLD (hyperlipidemia)   . Allergic rhinitis   . Mitral regurgitation     a. 02/2011 Echo: EF 72%, trace TR, mild MR/AI.  Marland Kitchen Hypertension   . Sleep apnea   . Clotting disorder     LEGS;throat  . Left Forearm AV Fistula     a. Asymptomatic.  No blood draws/IV's;  b. 06/2013 confirmed by L FA duplex.  . Incontinence      Past Surgical History  Procedure Laterality Date  . Thyroidectomy    . Coronary stent placement  07/2007    Mid LAD: 3.0 X 18 mm Xience stent  . Bunionette excision    . Cardiac catheterization  07/2007    99% mid LAD stenosis  . Total knee arthroplasty      left  . Multiple tooth extractions       Family History  Problem Relation Age of Onset  . Colon cancer    . Lymphoma    .  Heart disease    . Heart disease Mother   . Hyperlipidemia Mother      History   Social History  . Marital Status: Widowed    Spouse Name: N/A    Number of Children: 1  . Years of Education: N/A   Occupational History  . retired    Social History Main Topics  . Smoking status: Former Smoker -- 0.10 packs/day for 2 years    Types: Cigarettes    Quit date: 10/04/1991  . Smokeless tobacco: Never Used     Comment: smoked on and off  . Alcohol Use: No  . Drug Use: No  . Sexual Activity: Not on file   Other Topics Concern  . Not on file   Social History Narrative     PHYSICAL EXAM   BP 118/66 mmHg  Pulse 88  Ht 5\' 4"  (1.626 m)  Wt 111 lb (50.349 kg)  BMI 19.04 kg/m2 Constitutional: She is oriented to person, place, and time. She appears well-developed and well-nourished. No distress.  HENT: No nasal discharge.  Head: Normocephalic and  atraumatic.  Eyes: Pupils are equal and round. Right eye exhibits no discharge. Left eye exhibits no discharge.  Neck: Normal range of motion. Neck supple. No JVD present. No thyromegaly present.  Cardiovascular: Normal rate, regular rhythm, normal heart sounds. Exam reveals no gallop and no friction rub. No murmur heard.  Pulmonary/Chest: Effort normal and breath sounds normal. No stridor. No respiratory distress. She has no wheezes. She has no rales. She exhibits no tenderness.  Abdominal: Soft. Bowel sounds are normal. She exhibits no distension. There is no tenderness. There is no rebound and no guarding.  Musculoskeletal: Normal range of motion. She exhibits no edema and no tenderness.  Neurological: She is alert and oriented to person, place, and time. Coordination normal.  Skin: Skin is warm and dry. No rash noted. She is not diaphoretic. No erythema. No pallor.  Psychiatric: She has a normal mood and affect. Her behavior is normal. Judgment and thought content normal.  Left arm: There is a prominent pulsating vein in the left antecubital area with a thrill and a loud bruit. Distal pulses are intact but left radial pulse is diminished. No arm swelling.    ASSESSMENT AND PLAN

## 2015-01-01 ENCOUNTER — Telehealth: Payer: Self-pay

## 2015-01-01 NOTE — Telephone Encounter (Signed)
Spoke w/ pt.  Advised her that I am leaving samples of Crestor 20 mg at the front desk for her to p/u at her convenience.  She is appreciative and will call back w/ any questions or concerns.

## 2015-01-01 NOTE — Telephone Encounter (Signed)
Pt would like Crestor samples, or would like to be switched to something that is less expensive. Please advise. Pt states Dr. Yves DillNeelam Khan will no longer give out samples of Crestor.

## 2015-01-08 ENCOUNTER — Encounter: Payer: Self-pay | Admitting: Cardiovascular Disease

## 2015-01-08 ENCOUNTER — Ambulatory Visit (INDEPENDENT_AMBULATORY_CARE_PROVIDER_SITE_OTHER): Payer: Medicare Other | Admitting: Cardiovascular Disease

## 2015-01-08 VITALS — BP 100/55 | HR 86 | Ht 64.0 in | Wt 108.5 lb

## 2015-01-08 DIAGNOSIS — E785 Hyperlipidemia, unspecified: Secondary | ICD-10-CM | POA: Diagnosis not present

## 2015-01-08 DIAGNOSIS — I770 Arteriovenous fistula, acquired: Secondary | ICD-10-CM | POA: Diagnosis not present

## 2015-01-08 DIAGNOSIS — I2510 Atherosclerotic heart disease of native coronary artery without angina pectoris: Secondary | ICD-10-CM | POA: Diagnosis not present

## 2015-01-08 MED ORDER — ATORVASTATIN CALCIUM 40 MG PO TABS: 40.0000 mg | ORAL_TABLET | Freq: Every day | ORAL | Status: DC

## 2015-01-08 NOTE — Progress Notes (Signed)
Primary care physician: Dr. Yves Dill.   HPI  This is a 74 year old female who is here today for followup visit. She has known history of coronary artery disease status post drug-eluting stent placement to the left anterior descending artery in 2008. No cardiac events since then.  She underwent  left knee replacement in 2013.  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 She denies any left arm discomfort or swelling. Most recent nuclear stress test in December 2015 showed no evidence of ischemia with normal ejection fraction. She has been under stress lately and had some side effects to an antianxiety medication she was given. She does not know the name of the medication. She is having hard time affording Crestor.  Allergies  Allergen Reactions  . Codeine   . Ibandronic Acid   . Meloxicam   . Reclast [Zoledronic Acid]   . Zofran [Ondansetron]     BLOOD CLOT;caused cardiac arrest     Current Outpatient Prescriptions on File Prior to Visit  Medication Sig Dispense Refill  . aspirin 81 MG tablet Take 81 mg by mouth daily.     . Cholecalciferol (VITAMIN D) 2000 UNITS tablet Take by mouth. Takes 3 tablets daily.    Marland Kitchen denosumab (PROLIA) 60 MG/ML SOLN injection Inject 60 mg into the skin every 6 (six) months. Administer in upper arm, thigh, or abdomen    . levothyroxine (SYNTHROID, LEVOTHROID) 75 MCG tablet Take 75 mcg by mouth daily.      . Multiple Vitamin (MULTIVITAMIN) tablet Take 1 tablet by mouth daily.      . Ondansetron HCl (ZOFRAN PO) Take by mouth daily.    . potassium chloride (K-DUR) 10 MEQ tablet Take 10 mEq by mouth daily.    . rosuvastatin (CRESTOR) 20 MG tablet Take 1 tablet (20 mg total) by mouth daily. 30 tablet 6  . venlafaxine (EFFEXOR-XR) 75 MG 24 hr capsule Takes 1 1/2 tablets daily.     No current facility-administered medications on file prior to visit.     Past Medical History  Diagnosis Date  .  Hypothyroidism   . CAD (coronary artery disease)     a. 07/2007 Cath/PCI: LM nl, LAD 99p (3.0 x 18 Xience EES), D1 90ost, LCX nl, RCA nl;  b. 10/2013 MV: EF 72%, attenuation artifact, no ischemia.  Marland Kitchen HLD (hyperlipidemia)   . Allergic rhinitis   . Mitral regurgitation     a. 02/2011 Echo: EF 72%, trace TR, mild MR/AI.  Marland Kitchen Hypertension   . Sleep apnea   . Clotting disorder     LEGS;throat  . Left Forearm AV Fistula     a. Asymptomatic.  No blood draws/IV's;  b. 06/2013 confirmed by L FA duplex.  . Incontinence      Past Surgical History  Procedure Laterality Date  . Thyroidectomy    . Coronary stent placement  07/2007    Mid LAD: 3.0 X 18 mm Xience stent  . Bunionette excision    . Cardiac catheterization  07/2007    99% mid LAD stenosis  . Total knee arthroplasty      left  . Multiple tooth extractions       Family History  Problem Relation Age of Onset  . Colon cancer    . Lymphoma    . Heart disease    . Heart disease Mother   . Hyperlipidemia Mother      History   Social  History  . Marital Status: Widowed    Spouse Name: N/A  . Number of Children: 1  . Years of Education: N/A   Occupational History  . retired    Social History Main Topics  . Smoking status: Former Smoker -- 0.10 packs/day for 2 years    Types: Cigarettes    Quit date: 10/04/1991  . Smokeless tobacco: Never Used     Comment: smoked on and off  . Alcohol Use: No  . Drug Use: No  . Sexual Activity: Not on file   Other Topics Concern  . Not on file   Social History Narrative     PHYSICAL EXAM   BP 100/55 mmHg  Pulse 86  Ht 5\' 4"  (1.626 m)  Wt 108 lb 8 oz (49.215 kg)  BMI 18.61 kg/m2 Constitutional: She is oriented to person, place, and time. She appears well-developed and well-nourished. No distress.  HENT: No nasal discharge.  Head: Normocephalic and atraumatic.  Eyes: Pupils are equal and round. Right eye exhibits no discharge. Left eye exhibits no discharge.  Neck: Normal  range of motion. Neck supple. No JVD present. No thyromegaly present.  Cardiovascular: Normal rate, regular rhythm, normal heart sounds. Exam reveals no gallop and no friction rub. No murmur heard.  Pulmonary/Chest: Effort normal and breath sounds normal. No stridor. No respiratory distress. She has no wheezes. She has no rales. She exhibits no tenderness.  Abdominal: Soft. Bowel sounds are normal. She exhibits no distension. There is no tenderness. There is no rebound and no guarding.  Musculoskeletal: Normal range of motion. She exhibits no edema and no tenderness.  Neurological: She is alert and oriented to person, place, and time. Coordination normal.  Skin: Skin is warm and dry. No rash noted. She is not diaphoretic. No erythema. No pallor.  Psychiatric: She has a normal mood and affect. Her behavior is normal. Judgment and thought content normal.  Left arm: There is a prominent pulsating vein in the left antecubital area with a thrill and a loud bruit. Distal pulses are intact but left radial pulse is diminished. No arm swelling.    ASSESSMENT AND PLAN

## 2015-01-08 NOTE — Patient Instructions (Signed)
Stop taking Crestor.  Start Atorvastatin 40 mg once daily.   Return for fasting labs in 6 weeks.   Your physician wants you to follow-up in: 6 months.  You will receive a reminder letter in the mail two months in advance. If you don't receive a letter, please call our office to schedule the follow-up appointment.

## 2015-01-08 NOTE — Assessment & Plan Note (Signed)
She is doing well from a cardiac standpoint with no significant symptoms of chest pain. I recommend continuing medical therapy. We discussed the importance of controlling her anxiety.

## 2015-01-08 NOTE — Assessment & Plan Note (Signed)
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 She continues to be asymptomatic.  I recommend no IVs or blood draws  from the left arm. Continue to monitor for now. If she becomes symptomatic, surgical ligation might be needed.      

## 2015-01-08 NOTE — Assessment & Plan Note (Signed)
She is having hard time affording Crestor. Thus, I switched her to atorvastatin 40 mg once daily. Check fasting lipid and liver profile in 6 weeks.

## 2015-02-19 ENCOUNTER — Other Ambulatory Visit (INDEPENDENT_AMBULATORY_CARE_PROVIDER_SITE_OTHER): Payer: Medicare Other

## 2015-02-19 DIAGNOSIS — E785 Hyperlipidemia, unspecified: Secondary | ICD-10-CM | POA: Diagnosis not present

## 2015-02-20 LAB — LIPID PANEL
Chol/HDL Ratio: 2.2 ratio units (ref 0.0–4.4)
Cholesterol, Total: 163 mg/dL (ref 100–199)
HDL: 75 mg/dL (ref >39)
LDL Calculated: 75 mg/dL (ref 0–99)
Triglycerides: 64 mg/dL (ref 0–149)
VLDL Cholesterol Cal: 13 mg/dL (ref 5–40)

## 2015-02-20 LAB — HEPATIC FUNCTION PANEL
ALT: 26 IU/L (ref 0–32)
AST: 34 IU/L (ref 0–40)
Albumin: 4.2 g/dL (ref 3.5–4.8)
Alkaline Phosphatase: 72 IU/L (ref 39–117)
Bilirubin Total: 0.4 mg/dL (ref 0.0–1.2)
Bilirubin, Direct: 0.12 mg/dL (ref 0.00–0.40)
Total Protein: 7.2 g/dL (ref 6.0–8.5)

## 2015-07-29 ENCOUNTER — Other Ambulatory Visit: Payer: Self-pay | Admitting: Internal Medicine

## 2015-07-29 ENCOUNTER — Ambulatory Visit
Admission: RE | Admit: 2015-07-29 | Discharge: 2015-07-29 | Disposition: A | Discharge status: Home / Self Care | Payer: Medicare Other | Source: Ambulatory Visit | Attending: Internal Medicine | Admitting: Internal Medicine

## 2015-07-29 DIAGNOSIS — M25532 Pain in left wrist: Secondary | ICD-10-CM | POA: Diagnosis not present

## 2015-08-21 ENCOUNTER — Encounter: Payer: Self-pay | Admitting: Cardiovascular Disease

## 2015-08-21 ENCOUNTER — Ambulatory Visit (INDEPENDENT_AMBULATORY_CARE_PROVIDER_SITE_OTHER): Payer: Medicare Other | Admitting: Cardiovascular Disease

## 2015-08-21 VITALS — BP 110/68 | HR 79 | Ht 64.0 in | Wt 105.8 lb

## 2015-08-21 DIAGNOSIS — I2510 Atherosclerotic heart disease of native coronary artery without angina pectoris: Secondary | ICD-10-CM

## 2015-08-21 DIAGNOSIS — E785 Hyperlipidemia, unspecified: Secondary | ICD-10-CM

## 2015-08-21 DIAGNOSIS — I770 Arteriovenous fistula, acquired: Secondary | ICD-10-CM

## 2015-08-21 NOTE — Assessment & Plan Note (Signed)
She is doing extremely well with no symptoms suggestive of angina. Continue medical therapy. 

## 2015-08-21 NOTE — Patient Instructions (Signed)
Medication Instructions: Continue same medications.   Labwork: None.   Procedures/Testing: None.   Follow-Up: 6 months.   Any Additional Special Instructions Will Be Listed Below (If Applicable).

## 2015-08-21 NOTE — Assessment & Plan Note (Signed)
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 She continues to be asymptomatic.  I recommend no IVs or blood draws  from the left arm. Continue to monitor for now. If she becomes symptomatic, surgical ligation might be needed.

## 2015-08-21 NOTE — Assessment & Plan Note (Signed)
Lab Results  Component Value Date   CHOL 163 02/19/2015   HDL 75 02/19/2015   LDLCALC 75 02/19/2015   TRIG 64 02/19/2015   CHOLHDL 2.2 02/19/2015   Continue treatment with Atorvastatin.

## 2015-08-21 NOTE — Progress Notes (Signed)
Primary care physician: Dr. Yves Dill.   HPI  This is a 74 year old female who is here today for followup visit. She has known history of coronary artery disease status post drug-eluting stent placement to the left anterior descending artery in 2008. No cardiac events since then.  She underwent  left knee replacement in 2013.  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 She denies any left arm discomfort or swelling. Most recent nuclear stress test in December 2015 showed no evidence of ischemia with normal ejection fraction.   she has been doing very well overall with no chest pain. She was switched from Crestor to atorvastatin during last visit for cost reasons. She reports no side effects.   Allergies  Allergen Reactions  . Codeine   . Ibandronic Acid   . Meloxicam   . Reclast [Zoledronic Acid]   . Zofran [Ondansetron]     BLOOD CLOT;caused cardiac arrest     Current Outpatient Prescriptions on File Prior to Visit  Medication Sig Dispense Refill  . aspirin 81 MG tablet Take 81 mg by mouth daily.     Marland Kitchen atorvastatin (LIPITOR) 40 MG tablet Take 1 tablet (40 mg total) by mouth daily. 90 tablet 3  . Cholecalciferol (VITAMIN D) 2000 UNITS tablet Take by mouth. Takes 3 tablets daily.    Marland Kitchen denosumab (PROLIA) 60 MG/ML SOLN injection Inject 60 mg into the skin every 6 (six) months. Administer in upper arm, thigh, or abdomen    . FIBER PO Take 3 capsules by mouth daily.    Marland Kitchen levothyroxine (SYNTHROID, LEVOTHROID) 75 MCG tablet Take 75 mcg by mouth daily.      . Multiple Vitamin (MULTIVITAMIN) tablet Take 1 tablet by mouth daily.      . Ondansetron HCl (ZOFRAN PO) Take by mouth daily.    . potassium chloride (K-DUR) 10 MEQ tablet Take 10 mEq by mouth daily.    Marland Kitchen venlafaxine (EFFEXOR-XR) 75 MG 24 hr capsule Takes 1 1/2 tablets daily.     No current facility-administered medications on file prior to visit.     Past Medical History    Diagnosis Date  . Hypothyroidism   . CAD (coronary artery disease)     a. 07/2007 Cath/PCI: LM nl, LAD 99p (3.0 x 18 Xience EES), D1 90ost, LCX nl, RCA nl;  b. 10/2013 MV: EF 72%, attenuation artifact, no ischemia.  Marland Kitchen HLD (hyperlipidemia)   . Allergic rhinitis   . Mitral regurgitation     a. 02/2011 Echo: EF 72%, trace TR, mild MR/AI.  Marland Kitchen Hypertension   . Sleep apnea   . Clotting disorder (HCC)     LEGS;throat  . Left Forearm AV Fistula     a. Asymptomatic.  No blood draws/IV's;  b. 06/2013 confirmed by L FA duplex.  . Incontinence      Past Surgical History  Procedure Laterality Date  . Thyroidectomy    . Coronary stent placement  07/2007    Mid LAD: 3.0 X 18 mm Xience stent  . Bunionette excision    . Cardiac catheterization  07/2007    99% mid LAD stenosis  . Total knee arthroplasty      left  . Multiple tooth extractions       Family History  Problem Relation Age of Onset  . Colon cancer    . Lymphoma    . Heart disease    . Heart disease Mother   .  Hyperlipidemia Mother      Social History   Social History  . Marital Status: Widowed    Spouse Name: N/A  . Number of Children: 1  . Years of Education: N/A   Occupational History  . retired    Social History Main Topics  . Smoking status: Former Smoker -- 0.10 packs/day for 2 years    Types: Cigarettes    Quit date: 10/04/1991  . Smokeless tobacco: Never Used     Comment: smoked on and off  . Alcohol Use: No  . Drug Use: No  . Sexual Activity: Not on file   Other Topics Concern  . Not on file   Social History Narrative     PHYSICAL EXAM   BP 110/68 mmHg  Pulse 79  Ht 5\' 4"  (1.626 m)  Wt 105 lb 12 oz (47.968 kg)  BMI 18.14 kg/m2 Constitutional: She is oriented to person, place, and time. She appears well-developed and well-nourished. No distress.  HENT: No nasal discharge.  Head: Normocephalic and atraumatic.  Eyes: Pupils are equal and round. Right eye exhibits no discharge. Left eye  exhibits no discharge.  Neck: Normal range of motion. Neck supple. No JVD present. No thyromegaly present.  Cardiovascular: Normal rate, regular rhythm, normal heart sounds. Exam reveals no gallop and no friction rub. No murmur heard.  Pulmonary/Chest: Effort normal and breath sounds normal. No stridor. No respiratory distress. She has no wheezes. She has no rales. She exhibits no tenderness.  Abdominal: Soft. Bowel sounds are normal. She exhibits no distension. There is no tenderness. There is no rebound and no guarding.  Musculoskeletal: Normal range of motion. She exhibits no edema and no tenderness.  Neurological: She is alert and oriented to person, place, and time. Coordination normal.  Skin: Skin is warm and dry. No rash noted. She is not diaphoretic. No erythema. No pallor.  Psychiatric: She has a normal mood and affect. Her behavior is normal. Judgment and thought content normal.  Left arm: There is a prominent pulsating vein in the left antecubital area with a thrill and a loud bruit. Distal pulses are intact but left radial pulse is diminished. No arm swelling.   EKG: normal sinus rhythm with incomplete right branch block.  ASSESSMENT AND PLAN

## 2015-10-14 ENCOUNTER — Other Ambulatory Visit: Payer: Self-pay | Admitting: Internal Medicine

## 2015-10-14 ENCOUNTER — Ambulatory Visit
Admission: RE | Admit: 2015-10-14 | Discharge: 2015-10-14 | Disposition: A | Discharge status: Home / Self Care | Payer: Medicare Other | Source: Ambulatory Visit | Attending: Internal Medicine | Admitting: Internal Medicine

## 2015-10-14 DIAGNOSIS — M25572 Pain in left ankle and joints of left foot: Secondary | ICD-10-CM | POA: Diagnosis present

## 2015-10-14 DIAGNOSIS — M25472 Effusion, left ankle: Secondary | ICD-10-CM | POA: Insufficient documentation

## 2015-10-30 ENCOUNTER — Emergency Department: Payer: Medicare Other

## 2015-10-30 ENCOUNTER — Emergency Department
Admission: EM | Admit: 2015-10-30 | Discharge: 2015-10-30 | Disposition: A | Discharge status: Home / Self Care | Payer: Medicare Other | Attending: Emergency Medicine | Admitting: Emergency Medicine

## 2015-10-30 ENCOUNTER — Telehealth: Payer: Self-pay | Admitting: Physician Assistant

## 2015-10-30 ENCOUNTER — Encounter: Payer: Self-pay | Admitting: Intensive Care

## 2015-10-30 DIAGNOSIS — Y9289 Other specified places as the place of occurrence of the external cause: Secondary | ICD-10-CM | POA: Insufficient documentation

## 2015-10-30 DIAGNOSIS — Z7982 Long term (current) use of aspirin: Secondary | ICD-10-CM | POA: Diagnosis not present

## 2015-10-30 DIAGNOSIS — Z79899 Other long term (current) drug therapy: Secondary | ICD-10-CM | POA: Diagnosis not present

## 2015-10-30 DIAGNOSIS — I10 Essential (primary) hypertension: Secondary | ICD-10-CM | POA: Diagnosis not present

## 2015-10-30 DIAGNOSIS — Y9389 Activity, other specified: Secondary | ICD-10-CM | POA: Insufficient documentation

## 2015-10-30 DIAGNOSIS — R0989 Other specified symptoms and signs involving the circulatory and respiratory systems: Secondary | ICD-10-CM | POA: Diagnosis present

## 2015-10-30 DIAGNOSIS — X58XXXA Exposure to other specified factors, initial encounter: Secondary | ICD-10-CM | POA: Insufficient documentation

## 2015-10-30 DIAGNOSIS — Y998 Other external cause status: Secondary | ICD-10-CM | POA: Insufficient documentation

## 2015-10-30 DIAGNOSIS — T17308A Unspecified foreign body in larynx causing other injury, initial encounter: Secondary | ICD-10-CM

## 2015-10-30 DIAGNOSIS — T17220A Food in pharynx causing asphyxiation, initial encounter: Secondary | ICD-10-CM | POA: Insufficient documentation

## 2015-10-30 DIAGNOSIS — Z880 Allergy status to penicillin: Secondary | ICD-10-CM | POA: Insufficient documentation

## 2015-10-30 DIAGNOSIS — Z87891 Personal history of nicotine dependence: Secondary | ICD-10-CM | POA: Insufficient documentation

## 2015-10-30 NOTE — Progress Notes (Signed)
   Asked by family to see patient for choking episode on grits this morning. Patient has a history of CAD s/p PCI to LAD in 2008. Last ischemic evaluation 09/2014 showing no ischemia and normal EF, esophageal spasm as well as dysphagia. 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 She also has a strong history of anxiety on Effexor as well as family members with significant anxiety. Patient was eating grits this morning when she got choked leading to significant anxiety. Her daughter was with her and helped calm her and clear the grits. No LOC or respiratory distress. She was brought to [facility] via EMS. She now feels 99.9% better and is on room air. She doe snot have any chest pain, SOB, nausea, vomiting, diaphoresis, presyncope, syncope, or palpitations. CXR was ordered and showed no acitve cardiopulmonary disease, underlying COPD. Mild atherosclerosis. Of note, she has suffered 2 mechanical falls recently that are separate from the above.   There does not appear to be an active cardiac issue at this time. Can follow up with the patient in the office.   Eula Listen, PA-C 10/30/2015 8:20 AM

## 2015-10-30 NOTE — ED Notes (Signed)
Patient arrived by EMS from home. Patient reports "i was eating grits this morning and started choking without being able to swallow" patient denies chest pain or SOB. Only complaint from patient is she feels like grits are still stuck down her throat

## 2015-10-30 NOTE — Telephone Encounter (Signed)
Pt daughter called, pt had chest pain, SOB, diaphoresis, N&V this am. She is in [facility], wants Dr Kirke Corin to see her.  Advised her I would let that team know.  Leanna Battles 10/30/2015 7:36 AM Beeper [phone]

## 2015-10-30 NOTE — Discharge Instructions (Signed)
You have been seen in the emergency department today for a choking episode. Your chest x-ray is clear. As we discussed please follow-up with her primary care physician in one to 2 days for recheck/reevaluation. Return to the emergency department for any further episodes of choking, trouble breathing, or if you develop any chest pain, trouble breathing, or any other symptom personally concerning to yourself.    Choking, Adult  Choking occurs when a food or object gets stuck in the throat or trachea, blocking the airway. If the airway is partly blocked, coughing will usually cause the food or object to come out. If the airway is completely blocked, immediate action is needed to help it come out. A complete airway blockage is life threatening because it causes breathing to stop. Choking is a true medical emergency that requires fast, appropriate action by anyone available.  SIGNS OF AIRWAY BLOCKAGE  There is a partial airway blockage if you or the person who is choking is:  Able to breathe and speak.  Coughing loudly.  Making loud noises. There is a complete airway blockage if you or the person who is choking is:  Unable to breathe.  Making soft or high-pitched sounds while breathing.  Unable to cough or coughing weakly, ineffectively, or silently.  Unable to cry, speak, or make sounds.  Turning blue.  Holding the neck with both arms. This is the universal sign of choking. WHAT TO DO IF CHOKING OCCURS  If there is a partial airway blockage, allow coughing to clear the airway. Do not try to drink until the food or object comes out. If someone else has a partial airway blockage, do not interfere. Stay with him or her and watch for signs of complete airway blockage until the food or object comes out.  If there is a complete airway blockage or if there is a partial airway blockage and the food or object does not come out, perform abdominal thrusts (also referred to as the Heimlich maneuver). Abdominal  thrusts are used to create an artificial cough to try to clear the airway. Performing abdominal thrusts is part of a series of steps that should be done to help someone who is choking. Abdominal thrusts are usually done by someone else, but if you are alone, you can perform abdominal thrusts on yourself. Follow the procedure below that best fits your situation.  IF SOMEONE ELSE IS CHOKING:  For a conscious adult:  1. Ask the person whether he or she is choking. If the person nods, continue to step 2. 2. Stand or kneel behind the person and lean him or her forward slightly. 3. Make a fist with 1 hand, put your arms around the person, and grasp your fist with your other hand. Place the thumb side of your fist in the person's abdomen, just below the ribs. 4. Press inward and upward with both hands. 5. Repeat this maneuver until the object comes out and the person is able to breathe or until the person loses consciousness. For an unconscious adult:  1. Shout for help. If someone responds, have him or her call local emergency services (911 in [place] If no one responds, call local emergency services yourself if possible. 2. Begin CPR, starting with compressions. Every time you open the airway to give rescue breaths, open the person's mouth. If you can see the food or object and it can be easily pulled out, remove it with your fingers. 3. After 5 cycles or 2 minutes of CPR, call  local emergency services (911 in [place] if you or someone else did not already call. For a conscious adult who is obese or in the later stages of pregnancy:  Abdominal thrusts may not be effective when helping people who are in the later stages of pregnancy or who are obese. In these instances, chest thrusts can be used.  1. Ask the person whether he or she is choking. If the person nods and has signs of complete airway blockage, continue to step 2. 2. Stand behind the person and wrap your arms around his or her chest (with your arms  under the person's armpits). 3. Make a fist with 1 hand. Place the thumb side of your fist on the middle of the person's breastbone. 4. Grab your fist with your other hand and thrust backward. Continue this until the object comes out or until the person becomes unconscious. For an unconscious adult who is obese or in the later stages of pregnancy:  1. Shout for help. If someone responds, have him or her call local emergency services (911 in [place] If no one responds, call local emergency services yourself if possible. 2. Begin CPR, starting with compressions. Every time you open the airway to give rescue breaths, open the person's mouth. If you can see the food or object and it can be easily pulled out, remove it with your fingers. 3. After 5 cycles or 2 minutes of CPR, call local emergency services (911 in [place] if you or someone else did not already call. Note that abdominal thrusts (below the rib cage) should be used for a pregnant woman if possible. This should be possible until the later stages of pregnancy when there is no longer enough room between the enlarging uterus and the rib cage to perform the maneuver. At that point, chest thrusts must be used as described.  IF YOU ARE CHOKING:  1. Call local emergency services (911 in [place] if near a landline. Do not worry about communicating what is happening. Do not hang up the phone. Someone may be sent to help you anyway. 2. Make a fist with 1 hand. Put the thumb side of the fist against your stomach, just above the belly button and well below the breastbone. If you are pregnant or obese, put your fist on your chest instead, just below the breastbone and just above your lowest ribs. 3. Hold your fist with your other hand and bend over a hard surface, such as a table or chair. 4. Forcefully push your fist in and up. 5. Continue to do this until the food or object comes out. PREVENTION  To be prepared if choking occurs, learn how to correctly perform  abdominal thrusts and give CPR by taking a certified first-aid training course.  SEEK IMMEDIATE MEDICAL CARE IF:  You have a fever after choking stops.  You have problems breathing after choking stops.  You received the Heimlich maneuver. MAKE SURE YOU:  Understand these instructions.  Watch your condition.  Get help right away if you are not doing well or get worse. This information is not intended to replace advice given to you by your health care provider. Make sure you discuss any questions you have with your health care provider.  Document Released: 10/27/2004 Document Revised: 10/10/2014 Document Reviewed: 05/01/2012  [employer]vier Interactive Patient Education [employer]

## 2015-10-30 NOTE — ED Provider Notes (Signed)
[facility] Emergency Department Provider Note  Time seen: 7:37 AM  I have reviewed the triage vital signs and the nursing notes.   HISTORY  Chief Complaint Aspiration    HPI Monica Downs is a 75 y.o. female with a past medical history of cardiac disease status post stent, hypertension, hypothyroidism, hyperlipidemia, who presents to the emergency department after a choking episode. According to the patient and the daughter the patient was eating breakfast this morning (grits), and started choking.  Patient states it felt like she sucked some of the grits down her windpipe, and she began coughing, and had an episode of vomiting. Stated it was difficult for her to get air in, this lasted until EMS arrived and then patient felt better and was able to breathe again. Daughter states the patient does get anxious, and this could've contributed to that. Patient denies any complaints at this time. States she is feeling much better, denies any nausea currently. Denies any chest pain now or at any time. Did feel nauseated with the choking had one episode of vomiting but denies any abdominal discomfort.    Past Medical History  Diagnosis Date  . Hypothyroidism   . CAD (coronary artery disease)     a. 07/2007 Cath/PCI: LM nl, LAD 99p (3.0 x 18 Xience EES), D1 90ost, LCX nl, RCA nl;  b. 10/2013 MV: EF 72%, attenuation artifact, no ischemia.  Marland Kitchen HLD (hyperlipidemia)   . Allergic rhinitis   . Mitral regurgitation     a. 02/2011 Echo: EF 72%, trace TR, mild MR/AI.  Marland Kitchen Hypertension   . Sleep apnea   . Clotting disorder (HCC)     LEGS;throat  . Left Forearm AV Fistula     a. Asymptomatic.  No blood draws/IV's;  b. 06/2013 confirmed by L FA duplex.  . Incontinence     Patient Active Problem List   Diagnosis Date Noted  . 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 CAD (coronary artery disease)   . HLD (hyperlipidemia)     Past Surgical History  Procedure Laterality Date  .  Thyroidectomy    . Coronary stent placement  07/2007    Mid LAD: 3.0 X 18 mm Xience stent  . Bunionette excision    . Cardiac catheterization  07/2007    99% mid LAD stenosis  . Total knee arthroplasty      left  . Multiple tooth extractions      Current Outpatient Rx  Name  Route  Sig  Dispense  Refill  . aspirin 81 MG tablet   Oral   Take 81 mg by mouth daily.          Marland Kitchen atorvastatin (LIPITOR) 40 MG tablet   Oral   Take 1 tablet (40 mg total) by mouth daily.   90 tablet   3   . Cholecalciferol (VITAMIN D) 2000 UNITS tablet   Oral   Take by mouth. Takes 3 tablets daily.         Marland Kitchen denosumab (PROLIA) 60 MG/ML SOLN injection   Subcutaneous   Inject 60 mg into the skin every 6 (six) months. Administer in upper arm, thigh, or abdomen         . FIBER PO   Oral   Take 3 capsules by mouth daily.         Marland Kitchen levothyroxine (SYNTHROID, LEVOTHROID) 75 MCG tablet   Oral   Take 75 mcg by mouth daily.           Marland Kitchen  Multiple Vitamin (MULTIVITAMIN) tablet   Oral   Take 1 tablet by mouth daily.           . Ondansetron HCl (ZOFRAN PO)   Oral   Take by mouth daily.         . potassium chloride (K-DUR) 10 MEQ tablet   Oral   Take 10 mEq by mouth daily.         Marland Kitchen venlafaxine (EFFEXOR-XR) 75 MG 24 hr capsule      Takes 1 1/2 tablets daily.           Allergies Penicillins; Codeine; Ibandronic acid; Meloxicam; Mobic; Oxycodone; Reclast; and Zofran  Family History  Problem Relation Age of Onset  . Colon cancer    . Lymphoma    . Heart disease    . Heart disease Mother   . Hyperlipidemia Mother     Social History Social History  Substance Use Topics  . Smoking status: Former Smoker -- 0.10 packs/day for 2 years    Types: Cigarettes    Quit date: 10/04/1991  . Smokeless tobacco: Never Used     Comment: smoked on and off  . Alcohol Use: No    Review of Systems Constitutional: Negative for fever. Cardiovascular: Negative for chest pain. Respiratory:   Positive for shortness of breath, now resolved. Gastrointestinal: Negative for abdominal pain. One episode of vomiting. Neurological: Negative for headache 10-point ROS otherwise negative.  ____________________________________________   PHYSICAL EXAM:  VITAL SIGNS: ED Triage Vitals  Enc Vitals Group     BP 10/30/15 0714 144/82 mmHg     Pulse Rate 10/30/15 0715 82     Resp 10/30/15 0720 30     Temp 10/30/15 0720 97.7 F (36.5 C)     Temp Source 10/30/15 0720 Oral     SpO2 10/30/15 0715 100 %     Weight 10/30/15 0720 109 lb 2 oz (49.5 kg)     Height --      Head Cir --      Peak Flow --      Pain Score 10/30/15 0722 9     Pain Loc --      Pain Edu? --      Excl. in GC? --     Constitutional: Alert and oriented. Well appearing and in no distress. Eyes: Normal exam ENT   Head: Normocephalic and atraumatic.   Mouth/Throat: Mucous membranes are moist. Cardiovascular: Normal rate, regular rhythm. No murmur Respiratory: Normal respiratory effort without tachypnea nor retractions. Breath sounds are clear  Gastrointestinal: Soft and nontender. No distention.   Musculoskeletal: Nontender with normal range of motion in all extremities. Neurologic:  Normal speech and language. No gross focal neurologic deficits Skin:  Skin is warm, dry and intact.  Psychiatric: Mood and affect are normal. Speech and behavior are normal.  ____________________________________________   RADIOLOGY  Chest x-ray within normal limits     INITIAL IMPRESSION / ASSESSMENT AND PLAN / ED COURSE  Pertinent labs & imaging results that were available during my care of the patient were reviewed by me and considered in my medical decision making (see chart for details).  Patient presents to the emergency department after a choking episode. States she was eating grits when she began choking, felt like she cannot continue to air down. Was coughing with one episode of vomiting during the choking  episode. Patient is now feeling much better, denies any complaints at this time. We will check a chest x-ray. In the emergency  department the patient has a 100% O2 saturation on room air, no distress and clear lung sounds.  Chest x-ray normal. Patient remained symptom-free in the emergency department, with a reassuring exam. We will discharge the patient home with primary care follow-up. I discussed strict return precautions to which the patient and family are agreeable.  ____________________________________________   FINAL CLINICAL IMPRESSION(S) / ED DIAGNOSES  Choking/aspiration   Minna Antis, MD 10/30/15 [phone]

## 2015-11-08 ENCOUNTER — Encounter: Payer: Self-pay | Admitting: Physician Assistant

## 2015-11-11 ENCOUNTER — Encounter: Payer: Self-pay | Admitting: Physician Assistant

## 2015-11-11 ENCOUNTER — Ambulatory Visit (INDEPENDENT_AMBULATORY_CARE_PROVIDER_SITE_OTHER): Payer: Medicare Other | Admitting: Physician Assistant

## 2015-11-11 VITALS — BP 104/60 | HR 82 | Ht 64.0 in | Wt 106.5 lb

## 2015-11-11 DIAGNOSIS — R1310 Dysphagia, unspecified: Secondary | ICD-10-CM

## 2015-11-11 DIAGNOSIS — I2510 Atherosclerotic heart disease of native coronary artery without angina pectoris: Secondary | ICD-10-CM

## 2015-11-11 DIAGNOSIS — K089 Disorder of teeth and supporting structures, unspecified: Secondary | ICD-10-CM

## 2015-11-11 DIAGNOSIS — K224 Dyskinesia of esophagus: Secondary | ICD-10-CM

## 2015-11-11 DIAGNOSIS — F419 Anxiety disorder, unspecified: Secondary | ICD-10-CM | POA: Diagnosis not present

## 2015-11-11 DIAGNOSIS — R079 Chest pain, unspecified: Secondary | ICD-10-CM

## 2015-11-11 NOTE — Patient Instructions (Signed)
Medication Instructions:  None  Labwork: None  Testing/Procedures: None  Follow-Up: Dr. Kirke Corin in 6 months  Any Other Special Instructions Will Be Listed Below (If Applicable).     If you need a refill on your cardiac medications before your next appointment, please call your pharmacy.

## 2015-11-11 NOTE — Progress Notes (Signed)
Cardiology Office Note Date:  11/11/2015  Patient ID:  Monica Downs, Monica Downs [birth year], MRN [id number] PCP:  Jaclyn Shaggy, MD  Cardiologist:  Dr. Kirke Corin, MD    Chief Complaint: ED follow up for choking   History of Present Illness: Monica Downs is a 75 y.o. 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   She has not had any cardiac events since her intervention in 2008. She underwent successful left knee replacement in 2013. Her last ischemic evaluation was in 09/2014 done for progressive fatigue/possible anginal equivalent showing no ischemia and normal EF. At her last office visit with Dr. Kirke Corin in 08/2015 she had been doing very well overall with no chest pain. She was switched from Crestor to atorvastatin in April 2016 for cost reasons. She reported no side effects. She presented to the [facility] ED on 1/27 after getting choked on her breakfast. She was eating grits. Her daughter helped clear the grits and calm her down. No LOC or respiratory distress. She was brought to [facility] via EMS and upon cardiology interviewing her, at the patient's request, she reported feeling "99.9% better" and was on room air. She denied ever having any chest pain, SOB, nausea, vomiting, diaphoresis, presyncope, syncope, or palpitations. CXR was ordered by the ED physician which showed no active cardiopulmonary disease with underlying COPD. Mild atherosclerosis. She was discharged from the ED with outpatient follow up.   She has not had any further choking episodes since her ED visit. No chest pain, SOB, or tachy-palpitations. She reports in early January one of her 4 very large dogs knocked her down  leading to multiple broken teeth that need to be pulled and subsequently replaced with a partial dental implant. This has led to acute on chronic dysphagia. She reports never slowing down, is always in a hurry, and eats on the go. She knows she must slow down. She is doing well from a cardiac standpoint. No weight gain, LEE, orthopnea, PND, early satiety, presyncope, or syncope. She is tolerating her medications without issues.      Past Medical History  Diagnosis Date  . Hypothyroidism   . CAD (coronary artery disease)     a. 07/2007 Cath/PCI: LM nl, LAD 99p (3.0 x 18 Xience EES), D1 90ost, LCX nl, RCA nl;  b. 09/2014 MV: EF 72%, attenuation artifact, no ischemia.  Marland Kitchen HLD (hyperlipidemia)   . Allergic rhinitis   . Mitral regurgitation     a. 02/2011 Echo: EF 72%, trace TR, mild MR/AI.  Marland Kitchen Hypertension   . Sleep apnea   . Clotting disorder (HCC)     LEGS;throat  . Left Forearm AV Fistula     a. Asymptomatic.  No blood draws/IV's;  b. 06/2013 confirmed by L FA duplex.  . Incontinence   . Anxiety     Past Surgical History  Procedure Laterality Date  . Thyroidectomy    . Coronary stent placement  07/2007    Mid LAD: 3.0 X 18 mm Xience stent  . Bunionette excision    . Cardiac catheterization  07/2007    99% mid LAD stenosis  . Total knee arthroplasty  left  . Multiple tooth extractions      Current Outpatient Prescriptions  Medication Sig Dispense Refill  . aspirin 81 MG tablet Take 81 mg by mouth daily.     Marland Kitchen atorvastatin (LIPITOR) 40 MG tablet Take 1 tablet (40 mg total) by mouth daily. 90 tablet 3  . Cholecalciferol (VITAMIN D) 2000 UNITS tablet Take by mouth. Takes 3 tablets daily.    Marland Kitchen denosumab (PROLIA) 60 MG/ML SOLN injection Inject 60 mg into the skin every 6 (six) months. Administer in upper arm, thigh, or abdomen    . FIBER PO Take 3 capsules by mouth daily.    Marland Kitchen levothyroxine (SYNTHROID, LEVOTHROID) 75 MCG tablet Take 75 mcg by mouth daily.      . Multiple Vitamin  (MULTIVITAMIN) tablet Take 1 tablet by mouth daily.      . Ondansetron HCl (ZOFRAN PO) Take by mouth daily.    . potassium chloride (K-DUR) 10 MEQ tablet Take 10 mEq by mouth daily.    Marland Kitchen venlafaxine (EFFEXOR-XR) 75 MG 24 hr capsule Takes 1 1/2 tablets daily.     No current facility-administered medications for this visit.    Allergies:   Penicillins; Codeine; Ibandronic acid; Meloxicam; Mobic; Oxycodone; Reclast; and Zofran   Social History:  The patient  reports that she quit smoking about 24 years ago. Her smoking use included Cigarettes. She has a .2 pack-year smoking history. She has never used smokeless tobacco. She reports that she does not drink alcohol or use illicit drugs.   Family History:  The patient's family history includes Heart disease in her mother; Hyperlipidemia in her mother.  ROS:   Review of Systems  Constitutional: Negative for fever, chills, weight loss, malaise/fatigue and diaphoresis.  HENT: Negative for congestion.   Eyes: Negative for discharge and redness.  Respiratory: Negative for cough, hemoptysis, sputum production, shortness of breath and wheezing.   Cardiovascular: Negative for chest pain, palpitations, orthopnea, claudication, leg swelling and PND.  Gastrointestinal: Negative for nausea, vomiting and abdominal pain.  Musculoskeletal: Positive for falls. Negative for myalgias, back pain, joint pain and neck pain.  Skin: Negative for rash.  Neurological: Negative for dizziness, sensory change, speech change, focal weakness, seizures, loss of consciousness and weakness.  Endo/Heme/Allergies: Does not bruise/bleed easily.  Psychiatric/Behavioral: The patient is nervous/anxious.   All other systems reviewed and are negative.     PHYSICAL EXAM:  VS:  BP 104/60 mmHg  Pulse 82  Ht 5\' 4"  (1.626 m)  Wt 106 lb 8 oz (48.308 kg)  BMI 18.27 kg/m2 BMI: Body mass index is 18.27 kg/(m^2). Well nourished, well developed, in no acute distress HEENT:  normocephalic, atraumatic, poor dentition  Neck: no JVD, carotid bruits or masses Cardiac:  normal S1, S2; RRR; no murmurs, rubs, or gallops Lungs:  clear to auscultation bilaterally, no wheezing, rhonchi or rales Abd: soft, nontender, no hepatomegaly, + BS MS: no deformity or atrophy Ext: no edema Skin: warm and dry, no rash Neuro:  moves all extremities spontaneously, no focal abnormalities noted, follows commands Psych: euthymic mood, full affect   EKG:  Was ordered today. Shows NSR, 82 bpm, incomplete RBBB  Recent Labs: 02/19/2015: ALT 26  02/19/2015: Chol/HDL Ratio 2.2; Cholesterol, Total 163; HDL 75; LDL Calculated 75; Triglycerides 64   CrCl cannot be calculated (Patient has no serum creatinine result on file.).   Wt Readings from Last 3 Encounters:  11/11/15 106 lb 8 oz (48.308 kg)  10/30/15 109 lb 2 oz (49.5 kg)  08/21/15  105 lb 12 oz (47.968 kg)     Other studies reviewed: Additional studies/records reviewed today include: summarized above  ASSESSMENT AND PLAN:  1. History of choking/esophageal spasms/dysphagia/poor dentition: -Patient has 4 very large dogs at home. One of which knocked her down approximately 1 month ago leading to multiple broken teeth and mastication issues on top of her already dysphagia. She has an appointment with the dentist to have these broken teeth pulled and have a partial dental implant placed -No further choking episodes -Advised patient to eat slower as she reports eating very fast because she must tend to her dogs -Follow up with DDS and GI  2. CAD s/p PCI as above: -No chest pain, she was never with chest pain -Continue aspirin 81 mg, Lipitor 40 mg -No further ischemic work up at this time  3. AV fustula: -Stable  4. HLD: -Lipitor as above  5. Anxiety: -On Effexor -Per PCP  6. History of mechanical falls: Has fallen secondary to rugs in the house and her large dogs in the house. Advised patient to remove any rugs from her  house and discuss her dog situation with her family -Bottom line is she needs to slow down. She reports that she is always in a hurry and makes careless mistakes  Disposition: F/u with Dr. Kirke Corin, MD in 6 months  Current medicines are reviewed at length with the patient today.  The patient did not have any concerns regarding medicines.  Elinor Dodge PA-C 11/11/2015 9:10 AM     [facility] [place] [address] [place], [place] [address] [phone]

## 2015-11-20 ENCOUNTER — Telehealth: Payer: Self-pay

## 2015-11-20 NOTE — Telephone Encounter (Signed)
Pt daughter called, states that pt has had several episode of chest heaviness, no queezy, fatigue, sob. States she the last had one on 2/10. Pt is coming on 11/23/2015. Pt daughter will not be able to come with pt on Monday, and wanted Dr. Kirke Corin to know this.

## 2015-11-23 ENCOUNTER — Encounter: Payer: Self-pay | Admitting: Cardiovascular Disease

## 2015-11-23 ENCOUNTER — Ambulatory Visit (INDEPENDENT_AMBULATORY_CARE_PROVIDER_SITE_OTHER): Payer: Medicare Other | Admitting: Cardiovascular Disease

## 2015-11-23 VITALS — BP 120/60 | HR 81 | Ht 64.0 in | Wt 106.8 lb

## 2015-11-23 DIAGNOSIS — I770 Arteriovenous fistula, acquired: Secondary | ICD-10-CM

## 2015-11-23 DIAGNOSIS — I2510 Atherosclerotic heart disease of native coronary artery without angina pectoris: Secondary | ICD-10-CM | POA: Diagnosis not present

## 2015-11-23 DIAGNOSIS — E785 Hyperlipidemia, unspecified: Secondary | ICD-10-CM | POA: Diagnosis not present

## 2015-11-23 DIAGNOSIS — R0789 Other chest pain: Secondary | ICD-10-CM

## 2015-11-23 NOTE — Assessment & Plan Note (Signed)
Lab Results  Component Value Date   CHOL 163 02/19/2015   HDL 75 02/19/2015   LDLCALC 75 02/19/2015   TRIG 64 02/19/2015   CHOLHDL 2.2 02/19/2015   Continue treatment with atorvastatin.

## 2015-11-23 NOTE — Progress Notes (Signed)
Primary care physician: Dr. Yves Dill.   HPI  This is a 75 year old female who is here today for followup visit. She has known history of coronary artery disease status post drug-eluting stent placement to the left anterior descending artery in 2008. No cardiac events since then.  She underwent  left knee replacement in 2013.  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 She denies any left arm discomfort or swelling. Most recent nuclear stress test in December 2015 showed no evidence of ischemia with normal ejection fraction.  She went to the emergency room recently after she choked on some grits. This was followed by some substernal chest tightness. Her cardiac enzymes were negative and she was discharged home. She reports no recurrent episodes. She has rare episodes of substernal chest tightness when she is under stress.  She has been under increased stress at home. She was knocked down by her big dog and she broke some teeth.   Allergies  Allergen Reactions  . Penicillins Shortness Of Breath  . Codeine Other (See Comments)    Went into cardiac arrest at [facility] with this after surgery  . Ibandronic Acid   . Meloxicam   . Mobic [Meloxicam]   . Oxycodone Swelling    Swelling of tongue  . Reclast [Zoledronic Acid]   . Zofran [Ondansetron]     BLOOD CLOT;caused cardiac arrest     Current Outpatient Prescriptions on File Prior to Visit  Medication Sig Dispense Refill  . aspirin 81 MG tablet Take 81 mg by mouth daily.     Marland Kitchen atorvastatin (LIPITOR) 40 MG tablet Take 1 tablet (40 mg total) by mouth daily. 90 tablet 3  . Cholecalciferol (VITAMIN D) 2000 UNITS tablet Take by mouth. Takes 3 tablets daily.    Marland Kitchen denosumab (PROLIA) 60 MG/ML SOLN injection Inject 60 mg into the skin every 6 (six) months. Administer in upper arm, thigh, or abdomen    . FIBER PO Take 3 capsules by mouth daily.    Marland Kitchen levothyroxine (SYNTHROID, LEVOTHROID) 75 MCG  tablet Take 75 mcg by mouth daily.      . Multiple Vitamin (MULTIVITAMIN) tablet Take 1 tablet by mouth daily.      . Ondansetron HCl (ZOFRAN PO) Take by mouth daily.    . potassium chloride (K-DUR) 10 MEQ tablet Take 10 mEq by mouth daily.    Marland Kitchen venlafaxine (EFFEXOR-XR) 75 MG 24 hr capsule Takes 1 1/2 tablets daily.     No current facility-administered medications on file prior to visit.     Past Medical History  Diagnosis Date  . Hypothyroidism   . CAD (coronary artery disease)     a. 07/2007 Cath/PCI: LM nl, LAD 99p (3.0 x 18 Xience EES), D1 90ost, LCX nl, RCA nl;  b. 09/2014 MV: EF 72%, attenuation artifact, no ischemia.  Marland Kitchen HLD (hyperlipidemia)   . Allergic rhinitis   . Mitral regurgitation     a. 02/2011 Echo: EF 72%, trace TR, mild MR/AI.  Marland Kitchen Hypertension   . Sleep apnea   . Clotting disorder (HCC)     LEGS;throat  . Left Forearm AV Fistula     a. Asymptomatic.  No blood draws/IV's;  b. 06/2013 confirmed by L FA duplex.  . Incontinence   . Anxiety      Past Surgical History  Procedure Laterality Date  . Thyroidectomy    . Coronary stent placement  07/2007    Mid  LAD: 3.0 X 18 mm Xience stent  . Bunionette excision    . Cardiac catheterization  07/2007    99% mid LAD stenosis  . Total knee arthroplasty      left  . Multiple tooth extractions       Family History  Problem Relation Age of Onset  . Colon cancer    . Lymphoma    . Heart disease    . Heart disease Mother   . Hyperlipidemia Mother      Social History   Social History  . Marital Status: Widowed    Spouse Name: N/A  . Number of Children: 1  . Years of Education: N/A   Occupational History  . retired    Social History Main Topics  . Smoking status: Former Smoker -- 0.10 packs/day for 2 years    Types: Cigarettes    Quit date: 10/04/1991  . Smokeless tobacco: Never Used     Comment: smoked on and off  . Alcohol Use: No  . Drug Use: No  . Sexual Activity: Not on file   Other Topics  Concern  . Not on file   Social History Narrative     PHYSICAL EXAM   BP 120/60 mmHg  Pulse 81  Ht  (1.626 m)  Wt 106 lb 12 oz (48.421 kg)  BMI 18.31 kg/m2 Constitutional: She is oriented to person, place, and time. She appears well-developed and well-nourished. No distress.  HENT: No nasal discharge.  Head: Normocephalic and atraumatic.  Eyes: Pupils are equal and round. Right eye exhibits no discharge. Left eye exhibits no discharge.  Neck: Normal range of motion. Neck supple. No JVD present. No thyromegaly present.  Cardiovascular: Normal rate, regular rhythm, normal heart sounds. Exam reveals no gallop and no friction rub. No murmur heard.  Pulmonary/Chest: Effort normal and breath sounds normal. No stridor. No respiratory distress. She has no wheezes. She has no rales. She exhibits no tenderness.  Abdominal: Soft. Bowel sounds are normal. She exhibits no distension. There is no tenderness. There is no rebound and no guarding.  Musculoskeletal: Normal range of motion. She exhibits no edema and no tenderness.  Neurological: She is alert and oriented to person, place, and time. Coordination normal.  Skin: Skin is warm and dry. No rash noted. She is not diaphoretic. No erythema. No pallor.  Psychiatric: She has a normal mood and affect. Her behavior is normal. Judgment and thought content normal.  Left arm: There is a prominent pulsating vein in the left antecubital area with a thrill and a loud bruit. Distal pulses are intact but left radial pulse is diminished. No arm swelling.   EKG: normal sinus rhythm with incomplete right branch block.  ASSESSMENT AND PLAN

## 2015-11-23 NOTE — Patient Instructions (Signed)
Medication Instructions: Continue same medications.   Labwork: None.   Procedures/Testing: None.   Follow-Up: 6 months with Dr. Emony Dormer.   Any Additional Special Instructions Will Be Listed Below (If Applicable).     If you need a refill on your cardiac medications before your next appointment, please call your pharmacy.   

## 2015-11-23 NOTE — Assessment & Plan Note (Signed)
Episodes of chest pain likely due to stress. No exertional symptoms. Cardiac exam is unremarkable and ECG is unchanged. Continue to monitor symptoms. I asked her to follow-up with Dr. Arlana Pouch about management of stress and anxiety.

## 2015-11-23 NOTE — Assessment & Plan Note (Signed)
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 She continues to be asymptomatic.  I recommend no IVs or blood draws  from the left arm. Continue to monitor for now. If she becomes symptomatic, surgical ligation might be needed.      

## 2015-11-23 NOTE — Telephone Encounter (Signed)
Noted. Will make MD aware.  

## 2016-01-15 DIAGNOSIS — E039 Hypothyroidism, unspecified: Secondary | ICD-10-CM | POA: Diagnosis present

## 2016-02-06 ENCOUNTER — Other Ambulatory Visit: Payer: Self-pay | Admitting: Cardiovascular Disease

## 2016-02-09 ENCOUNTER — Other Ambulatory Visit: Payer: Self-pay

## 2016-02-09 MED ORDER — ATORVASTATIN CALCIUM 40 MG PO TABS: 40.0000 mg | ORAL_TABLET | Freq: Every day | ORAL | Status: DC

## 2016-02-09 NOTE — Telephone Encounter (Signed)
Refill sent for Lipitor 40 mg  

## 2016-04-11 ENCOUNTER — Emergency Department: Payer: Medicare Other

## 2016-04-11 ENCOUNTER — Emergency Department
Admission: EM | Admit: 2016-04-11 | Discharge: 2016-04-11 | Disposition: A | Discharge status: Other Acute Inpatient Hospital | Payer: Medicare Other | Attending: Emergency Medicine | Admitting: Emergency Medicine

## 2016-04-11 DIAGNOSIS — I619 Nontraumatic intracerebral hemorrhage, unspecified: Secondary | ICD-10-CM

## 2016-04-11 DIAGNOSIS — E785 Hyperlipidemia, unspecified: Secondary | ICD-10-CM | POA: Insufficient documentation

## 2016-04-11 DIAGNOSIS — S06360A Traumatic hemorrhage of cerebrum, unspecified, without loss of consciousness, initial encounter: Secondary | ICD-10-CM | POA: Diagnosis not present

## 2016-04-11 DIAGNOSIS — Y939 Activity, unspecified: Secondary | ICD-10-CM | POA: Diagnosis not present

## 2016-04-11 DIAGNOSIS — R0789 Other chest pain: Secondary | ICD-10-CM

## 2016-04-11 DIAGNOSIS — S2232XA Fracture of one rib, left side, initial encounter for closed fracture: Secondary | ICD-10-CM

## 2016-04-11 DIAGNOSIS — Z87891 Personal history of nicotine dependence: Secondary | ICD-10-CM | POA: Diagnosis not present

## 2016-04-11 DIAGNOSIS — Z7982 Long term (current) use of aspirin: Secondary | ICD-10-CM | POA: Insufficient documentation

## 2016-04-11 DIAGNOSIS — I10 Essential (primary) hypertension: Secondary | ICD-10-CM | POA: Diagnosis not present

## 2016-04-11 DIAGNOSIS — W1839XA Other fall on same level, initial encounter: Secondary | ICD-10-CM | POA: Diagnosis not present

## 2016-04-11 DIAGNOSIS — S0532XA Ocular laceration without prolapse or loss of intraocular tissue, left eye, initial encounter: Secondary | ICD-10-CM | POA: Insufficient documentation

## 2016-04-11 DIAGNOSIS — R55 Syncope and collapse: Secondary | ICD-10-CM

## 2016-04-11 DIAGNOSIS — S2242XA Multiple fractures of ribs, left side, initial encounter for closed fracture: Secondary | ICD-10-CM | POA: Diagnosis not present

## 2016-04-11 DIAGNOSIS — E039 Hypothyroidism, unspecified: Secondary | ICD-10-CM | POA: Diagnosis not present

## 2016-04-11 DIAGNOSIS — S066XAA Traumatic subarachnoid hemorrhage with loss of consciousness status unknown, initial encounter: Secondary | ICD-10-CM | POA: Insufficient documentation

## 2016-04-11 DIAGNOSIS — Y999 Unspecified external cause status: Secondary | ICD-10-CM | POA: Insufficient documentation

## 2016-04-11 DIAGNOSIS — S066X9A Traumatic subarachnoid hemorrhage with loss of consciousness of unspecified duration, initial encounter: Secondary | ICD-10-CM | POA: Insufficient documentation

## 2016-04-11 DIAGNOSIS — I2510 Atherosclerotic heart disease of native coronary artery without angina pectoris: Secondary | ICD-10-CM | POA: Insufficient documentation

## 2016-04-11 DIAGNOSIS — S59292A Other physeal fracture of lower end of radius, left arm, initial encounter for closed fracture: Secondary | ICD-10-CM | POA: Insufficient documentation

## 2016-04-11 DIAGNOSIS — Y929 Unspecified place or not applicable: Secondary | ICD-10-CM | POA: Diagnosis not present

## 2016-04-11 DIAGNOSIS — S52502A Unspecified fracture of the lower end of left radius, initial encounter for closed fracture: Secondary | ICD-10-CM

## 2016-04-11 LAB — COMPREHENSIVE METABOLIC PANEL
ALT: 29 U/L (ref 14–54)
AST: 34 U/L (ref 15–41)
Albumin: 4 g/dL (ref 3.5–5.0)
Alkaline Phosphatase: 80 U/L (ref 38–126)
Anion gap: 7 (ref 5–15)
BUN: 12 mg/dL (ref 6–20)
CO2: 31 mmol/L (ref 22–32)
Calcium: 8.7 mg/dL — ABNORMAL LOW (ref 8.9–10.3)
Chloride: 99 mmol/L — ABNORMAL LOW (ref 101–111)
Creatinine, Ser: 0.58 mg/dL (ref 0.44–1.00)
GFR calc Af Amer: >60 mL/min (ref >60)
GFR calc non Af Amer: >60 mL/min (ref >60)
Glucose, Bld: 94 mg/dL (ref 65–99)
Potassium: 3.3 mmol/L — ABNORMAL LOW (ref 3.5–5.1)
Sodium: 137 mmol/L (ref 135–145)
Total Bilirubin: 0.3 mg/dL (ref 0.3–1.2)
Total Protein: 7.4 g/dL (ref 6.5–8.1)

## 2016-04-11 LAB — URINALYSIS COMPLETE WITH MICROSCOPIC (ARMC ONLY)
Bacteria, UA: NONE SEEN
Bilirubin Urine: NEGATIVE
Glucose, UA: NEGATIVE mg/dL
Hgb urine dipstick: NEGATIVE
Ketones, ur: NEGATIVE mg/dL
Leukocytes, UA: NEGATIVE
Nitrite: NEGATIVE
Protein, ur: NEGATIVE mg/dL
RBC / HPF: NONE SEEN RBC/hpf (ref 0–5)
Specific Gravity, Urine: 1.004 — ABNORMAL LOW (ref 1.005–1.030)
pH: 6 (ref 5.0–8.0)

## 2016-04-11 LAB — CBC WITH DIFFERENTIAL/PLATELET
Basophils Absolute: 0.1 10*3/uL (ref 0–0.1)
Basophils Relative: 1 %
Eosinophils Absolute: 0.5 10*3/uL (ref 0–0.7)
Eosinophils Relative: 6 %
HCT: 37.3 % (ref 35.0–47.0)
Hemoglobin: 12.9 g/dL (ref 12.0–16.0)
Lymphocytes Relative: 34 %
Lymphs Abs: 2.8 10*3/uL (ref 1.0–3.6)
MCH: 30.9 pg (ref 26.0–34.0)
MCHC: 34.7 g/dL (ref 32.0–36.0)
MCV: 89 fL (ref 80.0–100.0)
Monocytes Absolute: 1 10*3/uL — ABNORMAL HIGH (ref 0.2–0.9)
Monocytes Relative: 13 %
Neutro Abs: 3.8 10*3/uL (ref 1.4–6.5)
Neutrophils Relative %: 46 %
Platelets: 255 10*3/uL (ref 150–440)
RBC: 4.19 MIL/uL (ref 3.80–5.20)
RDW: 13 % (ref 11.5–14.5)
WBC: 8.1 10*3/uL (ref 3.6–11.0)

## 2016-04-11 LAB — TROPONIN I: Troponin I: <0.03 ng/mL (ref <0.03)

## 2016-04-11 MED ORDER — LIDOCAINE-EPINEPHRINE-TETRACAINE (LET) SOLUTION
3.0000 mL | Freq: Once | NASAL | Status: AC
  Administered 2016-04-11: 3 mL via TOPICAL
  Filled 2016-04-11: qty 3

## 2016-04-11 MED ORDER — ACETAMINOPHEN 325 MG PO TABS
650.0000 mg | ORAL_TABLET | Freq: Once | ORAL | Status: AC
  Administered 2016-04-11: 650 mg via ORAL
  Filled 2016-04-11: qty 2

## 2016-04-11 NOTE — ED Notes (Signed)
Patient from home via EMS after a fall. She got up to use the bathroom and thinks she must have passed out because she does not remember falling. Patient with laceration to head, bleeding controlled.

## 2016-04-11 NOTE — ED Provider Notes (Signed)
[facility] Emergency Department Provider Note   ____________________________________________  Time seen: Approximately 5:16 AM  I have reviewed the triage vital signs and the nursing notes.   HISTORY  Chief Complaint Fall    HPI Monica Downs is a 75 y.o. female who comes into the hospital today with a syncopal episode. She reports that she got up to go to the bathroom and she passed out. The patient does not remember what happened. She denies any headache and denies chest pain. She does have some left-sided rib pain currently under her left breast. The patient has a history of osteoporosis and has had rib fractures on the right before. The patient also has had problems with tongue swelling with the most recent time being on Thursday but she has been doing well since then. The patient has been getting Benadryl all week. The daughter reports that she would not go to the doctor. She denies any nausea or vomiting and she is been feeling well this week.The patient fell and hit her head with some significant bleeding from a laceration. The patient rates her rib pain a 6 out of 10 in intensity.   Past Medical History  Diagnosis Date  . Hypothyroidism   . CAD (coronary artery disease)     a. 07/2007 Cath/PCI: LM nl, LAD 99p (3.0 x 18 Xience EES), D1 90ost, LCX nl, RCA nl;  b. 09/2014 MV: EF 72%, attenuation artifact, no ischemia.  Marland Kitchen HLD (hyperlipidemia)   . Allergic rhinitis   . Mitral regurgitation     a. 02/2011 Echo: EF 72%, trace TR, mild MR/AI.  Marland Kitchen Hypertension   . Sleep apnea   . Clotting disorder (HCC)     LEGS;throat  . Left Forearm AV Fistula     a. Asymptomatic.  No blood draws/IV's;  b. 06/2013 confirmed by L FA duplex.  . Incontinence   . Anxiety     Patient Active Problem List   Diagnosis Date Noted  . 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 CAD (coronary artery disease)   . HLD (hyperlipidemia)     Past Surgical History  Procedure Laterality  Date  . Thyroidectomy    . Coronary stent placement  07/2007    Mid LAD: 3.0 X 18 mm Xience stent  . Bunionette excision    . Cardiac catheterization  07/2007    99% mid LAD stenosis  . Total knee arthroplasty      left  . Multiple tooth extractions      Current Outpatient Rx  Name  Route  Sig  Dispense  Refill  . aspirin 81 MG tablet   Oral   Take 81 mg by mouth daily.          Marland Kitchen atorvastatin (LIPITOR) 40 MG tablet   Oral   Take 1 tablet (40 mg total) by mouth daily.   90 tablet   3   . Cholecalciferol (VITAMIN D) 2000 UNITS tablet   Oral   Take by mouth. Takes 3 tablets daily.         Marland Kitchen denosumab (PROLIA) 60 MG/ML SOLN injection   Subcutaneous   Inject 60 mg into the skin every 6 (six) months. Administer in upper arm, thigh, or abdomen         . FIBER PO   Oral   Take 3 capsules by mouth daily.         Marland Kitchen levothyroxine (SYNTHROID, LEVOTHROID) 75 MCG tablet   Oral   Take  75 mcg by mouth daily.           . Multiple Vitamin (MULTIVITAMIN) tablet   Oral   Take 1 tablet by mouth daily.           . Ondansetron HCl (ZOFRAN PO)   Oral   Take by mouth daily.         . potassium chloride (K-DUR) 10 MEQ tablet   Oral   Take 10 mEq by mouth daily.         Marland Kitchen. venlafaxine (EFFEXOR-XR) 75 MG 24 hr capsule      Takes 1 1/2 tablets daily.           Allergies Penicillins; Percocet; Vicodin; Codeine; Ibandronic acid; Meloxicam; Mobic; Oxycodone; Reclast; and Zofran  Family History  Problem Relation Age of Onset  . Colon cancer    . Lymphoma    . Heart disease    . Heart disease Mother   . Hyperlipidemia Mother     Social History Social History  Substance Use Topics  . Smoking status: Former Smoker -- 0.10 packs/day for 2 years    Types: Cigarettes    Quit date: 10/04/1991  . Smokeless tobacco: Never Used     Comment: smoked on and off  . Alcohol Use: No    Review of Systems Constitutional: No fever/chills Eyes: No visual changes. ENT:  No sore throat. Cardiovascular: left chest pain Respiratory: Denies shortness of breath. Gastrointestinal: No abdominal pain.  No nausea, no vomiting.  No diarrhea.  No constipation. Genitourinary: Negative for dysuria. Musculoskeletal: left wrist pain Skin: laceration Neurological: Negative for headaches, focal weakness or numbness.  10-point ROS otherwise negative.  ____________________________________________   PHYSICAL EXAM:  VITAL SIGNS: ED Triage Vitals  Enc Vitals Group     BP 04/11/16 0454 140/81 mmHg     Pulse Rate 04/11/16 0454 84     Resp 04/11/16 0454 18     Temp 04/11/16 0454 97.8 F (36.6 C)     Temp Source 04/11/16 0454 Oral     SpO2 04/11/16 0454 100 %     Weight 04/11/16 0454 108 lb (48.988 kg)     Height 04/11/16 0454 5\' 3"  (1.6 m)     Head Cir --      Peak Flow --      Pain Score 04/11/16 0448 6     Pain Loc --      Pain Edu? --      Excl. in GC? --     Constitutional: Alert and oriented.  Disheveled appearing and in moderate distress. Eyes: Conjunctivae are normal. PERRL. EOMI. Head: laceration to left lateral eye Nose: No congestion/rhinnorhea. Mouth/Throat: Mucous membranes are moist.  Oropharynx non-erythematous. Neck: No cervical spine tenderness to palpation. Cardiovascular: Normal rate, regular rhythm. Grossly normal heart sounds.  Good peripheral circulation. Respiratory: Normal respiratory effort.  No retractions. Lungs CTAB. Left rib tenderness to palpation under the patient's breast Gastrointestinal: Soft and nontender. No distention. Positive bowel sounds Musculoskeletal: Left wrist tenderness to palpation, no spinal tenderness to palpation Neurologic:  Normal speech and language. Cranial nerves II through XII are grossly intact with no focal motor or neuro deficits Skin:  Skin is warm, dry and intact.  Psychiatric: Mood and affect are normal.   ____________________________________________   LABS (all labs ordered are listed, but  only abnormal results are displayed)  Labs Reviewed  CBC WITH DIFFERENTIAL/PLATELET - Abnormal; Notable for the following:    Monocytes Absolute 1.0 (*)  All other components within normal limits  COMPREHENSIVE METABOLIC PANEL - Abnormal; Notable for the following:    Potassium 3.3 (*)    Chloride 99 (*)    Calcium 8.7 (*)    All other components within normal limits  TROPONIN I  URINALYSIS COMPLETEWITH MICROSCOPIC ([facility] ONLY)   ____________________________________________  EKG  ED ECG REPORT I, Rebecka Apley, the attending physician, personally viewed and interpreted this ECG.   Date: 04/11/2016  EKG Time: 450  Rate: 86  Rhythm: normal sinus rhythm  Axis: normal  Intervals:right bundle branch block  ST&T Change: none  ____________________________________________  RADIOLOGY  CT head: 5 mm punctate intraparenchymal hemorrhage in the left frontal white matter, no mass effect or midline shift, air-fluid levels in the paranasal sinuses are nonspecific  Left unilateral rib fractures: COPD, no acute cardiopulmonary process, healing right eighth through 10th rib fractures, age indeterminate right anterior sixth rib fracture, possible nondisplaced left lateral fourth rib fracture.  Wrist x-ray: Transverse fracture of the distal left radial metaphysis with associated fracture of the ulnar styloid process.  ____________________________________________   PROCEDURES  Procedure(s) performed: please, see procedure note(s).  Procedures   LACERATION REPAIR Performed by: Lucrezia Europe P Authorized by: Lucrezia Europe P Consent: Verbal consent obtained. Risks and benefits: risks, benefits and alternatives were discussed Consent given by: patient Patient identity confirmed: provided demographic data Prepped and Draped in normal sterile fashion Wound explored  Laceration Location: left lateral eye  Laceration Length: 6 cm  No Foreign Bodies seen or  palpated  topical  Local anesthetic:LET   Irrigation method: syringe Amount of cleaning: standard  Skin closure: histacryl  Patient tolerance: Patient tolerated the procedure well with no immediate complications.   SPLINT APPLICATION Date/Time: 7:40 AM Authorized by: Lucrezia Europe P Consent: Verbal consent obtained. Risks and benefits: risks, benefits and alternatives were discussed Consent given by: patient Splint applied by: ED technician Location details: left wrist Splint type: clamshell Supplies used: orthoglass Post-procedure: The splinted body part was neurovascularly unchanged following the procedure. Patient tolerance: Patient tolerated the procedure well with no immediate complications.    Critical Care performed: No  ____________________________________________   INITIAL IMPRESSION / ASSESSMENT AND PLAN / ED COURSE  Pertinent labs & imaging results that were available during my care of the patient were reviewed by me and considered in my medical decision making (see chart for details).  This is a 75 year old female who comes into the hospital today after a syncopal episode and a fall at home. The patient does have a CT scan that shows a punctate area of hemorrhage in the right frontal lobe. The concern is that the patient may have had a stroke which led to the syncope or this is a traumatic injury. The patient also does have a fracture of her left radius and ulna as well as some left fourth rib fracture. The patient was complaining of pain so I did give her a dose of Tylenol. She has some developing bruising around her left eye. I did speak with the nurse who reports that the patient did have some double vision that she reports when I did examine her that it was improving. Given the patient's injuries I feel that the patient needs further neurologic and neurosurgical evaluation. I discussed these results with the family and they wanted the patient to be  transferred to [facility]. I also feel that is an appropriate transfer for the patient. The patient did have her left eye wound  repaired with Dermabond. She will be transferred to an outside facility. ____________________________________________   FINAL CLINICAL IMPRESSION(S) / ED DIAGNOSES  Final diagnoses:  Other chest pain  Syncope, unspecified syncope type  Left rib fracture, closed, initial encounter  Distal radius fracture, left, closed, initial encounter  Intraparenchymal hemorrhage of brain (HCC)      NEW MEDICATIONS STARTED DURING THIS VISIT:  New Prescriptions   No medications on file     Note:  This document was prepared using Dragon voice recognition software and may include unintentional dictation errors.    Rebecka Apley, MD 04/11/16 [id number]

## 2016-04-11 NOTE — ED Notes (Signed)
Cleaned patient up and gave 2 warm blankets

## 2016-04-11 NOTE — ED Notes (Signed)
Call back from [place], RN, [facility] ED and report finished att

## 2016-04-11 NOTE — ED Notes (Signed)
Patient transported to CT 

## 2016-04-15 ENCOUNTER — Telehealth: Payer: Self-pay | Admitting: Cardiovascular Disease

## 2016-04-15 NOTE — Telephone Encounter (Signed)
Pt daughter calling stating pt was dish charged and was taken off her Asprin completely  Last one done was Sunday Would like to know about this and make sure this is all okay Pt is being taken to [facility].  Pt is coming in aug to see Dr Kirke CorinArida in aug  Please advise.

## 2016-04-15 NOTE — Telephone Encounter (Signed)
Noted. Forward to MD to make aware.

## 2016-04-15 NOTE — Telephone Encounter (Signed)
Sorry to hear that. She should follow up with me in few weeks.

## 2016-04-15 NOTE — Telephone Encounter (Signed)
Pt daughter called and states that pt had a bad fall and is in ICU in [place]. States she will be moved to [facility] home and states her BP has been low. [facility] [place] [facility]. She just wanted to let us know what is going on with her mom.

## 2016-04-18 NOTE — Telephone Encounter (Signed)
S/w pt daughter, Elita Quickam, who reports pt d/c'd to [facility] where she was admitted after a syncopal episode/fall at home. 81mg  aspirin has been d/c'd.  Daughter would like pt to have sooner f/u appt w/Dr. Kirke CorinArida.  She is agreeable to July 20 @ 9:15am

## 2016-04-18 NOTE — Telephone Encounter (Signed)
Returned call to pt daughter, Elita Quickam. Left message for her to call back.

## 2016-04-21 ENCOUNTER — Ambulatory Visit (INDEPENDENT_AMBULATORY_CARE_PROVIDER_SITE_OTHER): Payer: Medicare Other | Admitting: Cardiovascular Disease

## 2016-04-21 ENCOUNTER — Encounter: Payer: Self-pay | Admitting: Cardiovascular Disease

## 2016-04-21 ENCOUNTER — Telehealth: Payer: Self-pay | Admitting: Cardiovascular Disease

## 2016-04-21 VITALS — BP 110/52 | HR 82 | Ht 63.0 in | Wt 107.1 lb

## 2016-04-21 DIAGNOSIS — I2510 Atherosclerotic heart disease of native coronary artery without angina pectoris: Secondary | ICD-10-CM | POA: Diagnosis not present

## 2016-04-21 DIAGNOSIS — E785 Hyperlipidemia, unspecified: Secondary | ICD-10-CM

## 2016-04-21 NOTE — Progress Notes (Signed)
Cardiology Office Note   Date:  04/21/2016   ID:  Monica Downs, DOB 09-Apr-1941, MRN [id number]  PCP:  Jaclyn Shaggy, MD  Cardiologist:   Lorine Bears, MD   Chief Complaint  Patient presents with  . Follow-up    6 month f/u, pt just had a very bad fall      History of Present Illness: Monica Downs is a 75 y.o. female who presents for A follow-up visit regarding coronary artery disease status post angioplasty and drug-eluting stent placement to the left anterior descending artery in 2008. No cardiac events since then. 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  Most recent nuclear stress test in December 2015 showed no evidence of ischemia with normal ejection fraction.   Unfortunately, she fell recently after she woke up in the middle of the night trying to go to the bathroom. She tripped and had a bad fall which resulted an multiple injuries including left arm fractures, facial fractures and subarachnoid hemorrhage. She was hospitalized at [facility] with gradual improvement. She was discharged to rehabilitation. Overall she has been doing reasonably well and denies any chest pain or shortness of breath. Blood pressure was running low but that has improved. She continues to complain of orthostatic dizziness but she is trying to be more careful and walks with a walker.   Past Medical History  Diagnosis Date  . Hypothyroidism   . CAD (coronary artery disease)     a. 07/2007 Cath/PCI: LM nl, LAD 99p (3.0 x 18 Xience EES), D1 90ost, LCX nl, RCA nl;  b. 09/2014 MV: EF 72%, attenuation artifact, no ischemia.  Marland Kitchen HLD (hyperlipidemia)   . Allergic rhinitis   . Mitral regurgitation     a. 02/2011 Echo: EF 72%, trace TR, mild MR/AI.  Marland Kitchen Hypertension   . Sleep apnea   . Clotting disorder (HCC)     LEGS;throat  . Left Forearm AV Fistula     a. Asymptomatic.  No blood draws/IV's;  b. 06/2013 confirmed by L FA duplex.  . Incontinence   .  Anxiety     Past Surgical History  Procedure Laterality Date  . Thyroidectomy    . Coronary stent placement  07/2007    Mid LAD: 3.0 X 18 mm Xience stent  . Bunionette excision    . Cardiac catheterization  07/2007    99% mid LAD stenosis  . Total knee arthroplasty      left  . Multiple tooth extractions       Current Outpatient Prescriptions  Medication Sig Dispense Refill  . atorvastatin (LIPITOR) 40 MG tablet Take 1 tablet (40 mg total) by mouth daily. 90 tablet 3  . Cholecalciferol (VITAMIN D) 2000 UNITS tablet Take by mouth. Takes 3 tablets daily.    . clindamycin (CLEOCIN) 300 MG capsule Take 300 mg by mouth every 6 (six) hours.    Marland Kitchen denosumab (PROLIA) 60 MG/ML SOLN injection Inject 60 mg into the skin every 6 (six) months. Administer in upper arm, thigh, or abdomen    . famotidine (TH FAMOTIDINE 10) 10 MG tablet Take 10 mg by mouth daily.    . ferrous sulfate 325 (65 FE) MG tablet Take 325 mg by mouth daily with breakfast.    . levothyroxine (SYNTHROID, LEVOTHROID) 75 MCG tablet Take 75 mcg by mouth daily.      . Multiple Vitamin (MULTIVITAMIN) tablet Take 1 tablet by mouth daily.      Marland Kitchen  polyethylene glycol (MIRALAX / GLYCOLAX) packet Take 17 g by mouth daily.    . potassium chloride (K-DUR) 10 MEQ tablet Take 10 mEq by mouth daily.    Marland Kitchen venlafaxine (EFFEXOR) 100 MG tablet Take 100 mg by mouth 2 (two) times daily.    Marland Kitchen aspirin 81 MG tablet Take 81 mg by mouth daily. Reported on 04/21/2016     No current facility-administered medications for this visit.    Allergies:   Azithromycin; Ondansetron hcl; Penicillins; Percocet; Reclast; Vicodin; Codeine; Ibandronic acid; Meloxicam; Mobic; Oxycodone; and Zofran    Social History:  The patient  reports that she quit smoking about 24 years ago. Her smoking use included Cigarettes. She has a .2 pack-year smoking history. She has never used smokeless tobacco. She reports that she does not drink alcohol or use illicit drugs.   Family  History:  The patient's family history includes Heart disease in her mother; Hyperlipidemia in her mother.    ROS:  Please see the history of present illness.   Otherwise, review of systems are positive for none.   All other systems are reviewed and negative.    PHYSICAL EXAM: VS:  BP 110/52 mmHg  Pulse 82  Ht 5\' 3"  (1.6 m)  Wt 107 lb 1.9 oz (48.589 kg)  BMI 18.98 kg/m2  SpO2 96% , BMI Body mass index is 18.98 kg/(m^2). GEN: Well nourished, well developed, in no acute distress HEENT: normal Neck: no JVD, carotid bruits, or masses Cardiac: RRR; no murmurs, rubs, or gallops,no edema  Respiratory:  clear to auscultation bilaterally, normal work of breathing GI: soft, nontender, nondistended, + BS MS: no deformity or atrophy Skin: warm and dry, no rash Neuro:  Strength and sensation are intact Psych: euthymic mood, full affect   EKG:  EKG is ordered today. The ekg ordered today demonstrates normal sinus rhythm with incomplete right bundle branch block. Mildly prolonged QT interval   Recent Labs: 04/11/2016: ALT 29; BUN 12; Creatinine, Ser 0.58; Hemoglobin 12.9; Platelets 255; Potassium 3.3*; Sodium 137    Lipid Panel    Component Value Date/Time   CHOL 163 02/19/2015 0939   TRIG 64 02/19/2015 0939   HDL 75 02/19/2015 0939   CHOLHDL 2.2 02/19/2015 0939   LDLCALC 75 02/19/2015 0939      Wt Readings from Last 3 Encounters:  04/21/16 107 lb 1.9 oz (48.589 kg)  04/11/16 108 lb (48.988 kg)  11/23/15 106 lb 12 oz (48.421 kg)        ASSESSMENT AND PLAN:  1.  Coronary artery disease involving native coronary arteries without angina: Overall she is stable from a cardiac standpoint. I agree with holding aspirin for now given her recent subarachnoid hemorrhage which was traumatic.  2. Hyperlipidemia: Continue treatment with atorvastatin. Most recent LDL was 75.  3. Orthostatic dizziness: Likely due to orthostatic hypotension. I advised her to stay well-hydrated and avoid  sudden standing up.  4. Sleep apnea: She did not tolerate CPAP in the past as she gets a feeling of choking. She has follow-up planned at [facility] and I advised her to discuss that with them. She might require some form of oral prosthesis to help her sleep.  5. Early memory problems: This seems to be contributing to some of her injuries and issues recently. The daughter was present today and I discussed with her the importance of home safety evaluation if she is to go back to her house. She also has to find an arrangement to take care  of the pets she has at the house.    Disposition:   FU with me in 3 months  Signed,  Lorine BearsMuhammad Pranavi Aure, MD  04/21/2016 9:37 AM    [facility]

## 2016-04-21 NOTE — Telephone Encounter (Signed)
Nurse needs clarification regarding pt medications. Please call. May be shift change, so Gavin PoundDeborah may be gone, if so ask to speak with [employer]on McNeal.

## 2016-04-21 NOTE — Telephone Encounter (Signed)
S/w Argentina Donovaneborah Torain, nurse at [facility], who inquires if pt needs to continue to hold 81mg  asa.  Harrington Challengerdvised Deborah that Dr. Kirke CorinArida is agreeable to hold asa as instructed by physicians [facility].  She also inquires of vitamin D dosage as med list from our office indicates 2000u TID but [facility] med list is 2000u qd. Advised her to contact PCP regarding vitamin D. She verbalized understanding and is agreeable w/plan

## 2016-04-21 NOTE — Patient Instructions (Signed)
Medication Instructions: Continue same medications. Continue to hold Aspirin.   Labwork: None.   Procedures/Testing: None.   Follow-Up: 3 months with Dr. Kirke CorinArida.   Any Additional Special Instructions Will Be Listed Below (If Applicable).  Discuss with ENT about management of sleep apnea with special mouth piece if needed.    If you need a refill on your cardiac medications before your next appointment, please call your pharmacy.

## 2016-05-13 ENCOUNTER — Ambulatory Visit: Payer: Medicare Other | Admitting: Cardiovascular Disease

## 2016-05-13 ENCOUNTER — Telehealth: Payer: Self-pay | Admitting: Cardiovascular Disease

## 2016-05-13 NOTE — Telephone Encounter (Signed)
Daughter wanting to know if her Mother's DMV form for a handicap sticker has been completed? Please call [name].

## 2016-05-16 NOTE — Telephone Encounter (Signed)
Left message on daughter, [name]'s, cell VM. Handicap applications ready for pick up.

## 2016-05-26 ENCOUNTER — Ambulatory Visit: Payer: Medicare Other | Admitting: Cardiovascular Disease

## 2016-06-17 ENCOUNTER — Ambulatory Visit (INDEPENDENT_AMBULATORY_CARE_PROVIDER_SITE_OTHER): Payer: Medicare Other | Admitting: Pulmonary Disease

## 2016-06-17 ENCOUNTER — Institutional Professional Consult (permissible substitution): Payer: Medicare Other | Admitting: Pulmonary Disease

## 2016-06-17 ENCOUNTER — Encounter: Payer: Self-pay | Admitting: Pulmonary Disease

## 2016-06-17 VITALS — BP 118/60 | HR 79 | Ht 64.5 in | Wt 110.0 lb

## 2016-06-17 DIAGNOSIS — I609 Nontraumatic subarachnoid hemorrhage, unspecified: Secondary | ICD-10-CM

## 2016-06-17 DIAGNOSIS — I2510 Atherosclerotic heart disease of native coronary artery without angina pectoris: Secondary | ICD-10-CM

## 2016-06-17 DIAGNOSIS — R0683 Snoring: Secondary | ICD-10-CM

## 2016-06-17 DIAGNOSIS — G4733 Obstructive sleep apnea (adult) (pediatric): Secondary | ICD-10-CM | POA: Diagnosis not present

## 2016-06-17 DIAGNOSIS — G4710 Hypersomnia, unspecified: Secondary | ICD-10-CM

## 2016-06-17 DIAGNOSIS — Z87898 Personal history of other specified conditions: Secondary | ICD-10-CM

## 2016-06-17 NOTE — Patient Instructions (Signed)
1) we will order repeat sleep study 2) Resume flonase nasal inhaler 3) I will look into prior evaluation of angioedema (tongue swelling) 4) follow up in 6-8 weeks - we will call with sleep study result prior to that

## 2016-06-17 NOTE — Progress Notes (Signed)
PULMONARY/SLEEP CONSULT NOTE  Requesting MD/Service: Kirke CorinArida Date of initial consultation: 06/17/16 Reason for consultation: Sleep apnea  PT PROFILE: [age] F remote smoker (minimal) with recent fall and traumatic SAH, history of CAD, prior diagnosis of OSA, previously intolerant to CPAP referred for further eval/mgmt of OSA  HPI:  [age] F with above history. She has recently completed rehab after traumatic SAH that also included facial bone fractures. While hospitalized, she was noted to have nocturnal hypoxemia. Since discharge, she has lived with her daughter who notes heavy snoring. She has previously undergone sleep study (daughter thinks approx 5 yrs ago) and reportedly showed "severe" sleep apnea. She was previously intolerant to CPAP. 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 She does report daytime sleepiness. She scored 12 on the Epworth scale.   Past Medical History:  Diagnosis Date  . Allergic rhinitis   . Anxiety   . CAD (coronary artery disease)    a. 07/2007 Cath/PCI: LM nl, LAD 99p (3.0 x 18 Xience EES), D1 90ost, LCX nl, RCA nl;  b. 09/2014 MV: EF 72%, attenuation artifact, no ischemia.  . Clotting disorder (HCC)    LEGS;throat  . HLD (hyperlipidemia)   . Hypertension   . Hypothyroidism   . Incontinence   . Left Forearm AV Fistula    a. Asymptomatic.  No blood draws/IV's;  b. 06/2013 confirmed by L FA duplex.  . Mitral regurgitation    a. 02/2011 Echo: EF 72%, trace TR, mild MR/AI.  Marland Kitchen. Sleep apnea     Past Surgical History:  Procedure Laterality Date  . BUNIONETTE EXCISION    . CARDIAC CATHETERIZATION  07/2007   99% mid LAD stenosis  . CORONARY STENT PLACEMENT  07/2007   Mid LAD: 3.0 X 18 mm Xience stent  . MULTIPLE TOOTH EXTRACTIONS    . THYROIDECTOMY    . TOTAL KNEE ARTHROPLASTY     left    MEDICATIONS: I have reviewed all medications and confirmed regimen as documented  Social History   Social History  . Marital status: Widowed    Spouse name: N/A   . Number of children: 1  . Years of education: N/A   Occupational History  . retired    Social History Main Topics  . Smoking status: Former Smoker    Packs/day: 0.10    Years: 2.00    Types: Cigarettes    Quit date: 10/04/1991  . Smokeless tobacco: Never Used     Comment: smoked on and off  . Alcohol use No  . Drug use: No  . Sexual activity: Not on file   Other Topics Concern  . Not on file   Social History Narrative  . No narrative on file    Family History  Problem Relation Age of Onset  . Heart disease Mother   . Hyperlipidemia Mother   . Colon cancer    . Lymphoma    . Heart disease      ROS: No fever, myalgias/arthralgias, unexplained weight loss or weight gain No new focal weakness or sensory deficits No otalgia, hearing loss, visual changes, nasal and sinus symptoms, mouth and throat problems No neck pain or adenopathy No abdominal pain, N/V/D, diarrhea, change in bowel pattern No dysuria, change in urinary pattern   Vitals:   06/17/16 0906  BP: 118/60  Pulse: 79  SpO2: 98%  Weight: 110 lb (49.9 kg)  Height: 5' 4.5" (1.638 m)     EXAM:  Gen: WDWN, thin, no distress HEENT: NCAT, sclera  white, oropharynx normal, nasal turbinates mildly enlarged Neck: Supple without LAN, thyromegaly, JVD Lungs: breath sounds: full, percussion: normal, No adventitious sounds Cardiovascular: RRR, no murmurs noted Abdomen: Soft, nontender, normal BS Ext: webbed digits on toes, no clubbing, cyanosis or edema Neuro: CNs grossly intact, motor and sensory intact Skin: Limited exam, no lesions noted  DATA:   BMP Latest Ref Rng & Units 04/11/2016  Glucose 65 - 99 mg/dL 94  BUN 6 - 20 mg/dL 12  Creatinine 5.62 - 1.30 mg/dL 8.65  Sodium 784 - 696 mmol/L 137  Potassium 3.5 - 5.1 mmol/L 3.3(L)  Chloride 101 - 111 mmol/L 99(L)  CO2 22 - 32 mmol/L 31  Calcium 8.9 - 10.3 mg/dL 2.9(B)    CBC Latest Ref Rng & Units 04/11/2016  WBC 3.6 - 11.0 K/uL 8.1  Hemoglobin 12.0 -  16.0 g/dL 28.4  Hematocrit 13.2 - 47.0 % 37.3  Platelets 150 - 440 K/uL 255    CXR (10/30/15):  No acute disease  IMPRESSION:     ICD-9-CM ICD-10-CM   1. OSA (obstructive sleep apnea) 327.23 G47.33 Polysomnography 4 or more parameters  2. Recent SAH 430 I60.9 Polysomnography 4 or more parameters  3. Hypersomnolence 780.54 G47.10 Polysomnography 4 or more parameters  4. Snoring 786.09 R06.83 Polysomnography 4 or more parameters  5. History of angioedema V13.89 Z87.898 Polysomnography 4 or more parameters   As an aside, her daughter reports that she has had 8-10 episodes of unexplained "tongue and lip swelling" that has been variously attributed to medications and insect bites. She reports that on at least one occasion, there was airway compromise that almost required intubation  PLAN:  1) repeat sleep study ordered 2) Recommended resuming Flonase for mild turbinate swelling 3) Follow up 4-6 weeks to review PSG and discuss treatment options.  4) upon follow up, will consider initiating evaluation for angioedema (complement levels, etc)   Billy Fischer, MD PCCM service Mobile [phone] Pager [phone] 06/17/2016

## 2016-07-15 ENCOUNTER — Encounter: Payer: Self-pay | Admitting: Cardiovascular Disease

## 2016-07-15 ENCOUNTER — Ambulatory Visit (INDEPENDENT_AMBULATORY_CARE_PROVIDER_SITE_OTHER): Payer: Medicare Other | Admitting: Cardiovascular Disease

## 2016-07-15 VITALS — BP 104/60 | HR 87 | Ht 64.5 in | Wt 109.0 lb

## 2016-07-15 DIAGNOSIS — I2510 Atherosclerotic heart disease of native coronary artery without angina pectoris: Secondary | ICD-10-CM | POA: Diagnosis not present

## 2016-07-15 DIAGNOSIS — E785 Hyperlipidemia, unspecified: Secondary | ICD-10-CM | POA: Diagnosis not present

## 2016-07-15 NOTE — Progress Notes (Signed)
Cardiology Office Note   Date:  07/15/2016   ID:  Monica Downs, DOB 30-Oct-1940, MRN [id number]  PCP:  Jaclyn Shaggy, MD  Cardiologist:   Lorine Bears, MD   Chief Complaint  Patient presents with  . Coronary Artery Disease  . Hyperlipidemia      History of Present Illness: Monica Downs is a 75 y.o. female who presents for A follow-up visit regarding coronary artery disease status post angioplasty and drug-eluting stent placement to the left anterior descending artery in 2008. No cardiac events since then. 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  Most recent nuclear stress test in December 2015 showed no evidence of ischemia with normal ejection fraction.  She recovered completely from a fall in July which resulted in multiple injuries including left arm fractures, facial fractures and subarachnoid hemorrhage.  She has been doing well and denies any chest pain or shortness of breath.  Past Medical History:  Diagnosis Date  . Allergic rhinitis   . Anxiety   . CAD (coronary artery disease)    a. 07/2007 Cath/PCI: LM nl, LAD 99p (3.0 x 18 Xience EES), D1 90ost, LCX nl, RCA nl;  b. 09/2014 MV: EF 72%, attenuation artifact, no ischemia.  . Clotting disorder (HCC)    LEGS;throat  . HLD (hyperlipidemia)   . Hypertension   . Hypothyroidism   . Incontinence   . Left Forearm AV Fistula    a. Asymptomatic.  No blood draws/IV's;  b. 06/2013 confirmed by L FA duplex.  . Mitral regurgitation    a. 02/2011 Echo: EF 72%, trace TR, mild MR/AI.  Marland Kitchen Sleep apnea     Past Surgical History:  Procedure Laterality Date  . BUNIONETTE EXCISION    . CARDIAC CATHETERIZATION  07/2007   99% mid LAD stenosis  . CORONARY STENT PLACEMENT  07/2007   Mid LAD: 3.0 X 18 mm Xience stent  . MULTIPLE TOOTH EXTRACTIONS    . THYROIDECTOMY    . TOTAL KNEE ARTHROPLASTY     left     Current Outpatient Prescriptions  Medication Sig Dispense Refill    . aspirin 81 MG tablet Take 81 mg by mouth daily. Reported on 04/21/2016    . atorvastatin (LIPITOR) 40 MG tablet Take 1 tablet (40 mg total) by mouth daily. 90 tablet 3  . Cholecalciferol (VITAMIN D) 2000 UNITS tablet Take by mouth. Takes 3 tablets daily.    . clindamycin (CLEOCIN) 300 MG capsule Take 300 mg by mouth every 6 (six) hours.    Marland Kitchen denosumab (PROLIA) 60 MG/ML SOLN injection Inject 60 mg into the skin every 6 (six) months. Administer in upper arm, thigh, or abdomen    . famotidine (TH FAMOTIDINE 10) 10 MG tablet Take 10 mg by mouth daily.    . ferrous sulfate 325 (65 FE) MG tablet Take 325 mg by mouth daily with breakfast.    . levothyroxine (SYNTHROID, LEVOTHROID) 75 MCG tablet Take 75 mcg by mouth daily.      . Multiple Vitamin (MULTIVITAMIN) tablet Take 1 tablet by mouth daily.      . polyethylene glycol (MIRALAX / GLYCOLAX) packet Take 17 g by mouth daily.    . potassium chloride (K-DUR) 10 MEQ tablet Take 10 mEq by mouth daily.    Marland Kitchen venlafaxine (EFFEXOR) 100 MG tablet Take 100 mg by mouth 2 (two) times daily.     No current facility-administered medications for this visit.  Allergies:   Azithromycin; Ondansetron hcl; Penicillins; Percocet [oxycodone-acetaminophen]; Reclast [zoledronic acid]; Vicodin [hydrocodone-acetaminophen]; Codeine; Ibandronic acid; Meloxicam; Mobic [meloxicam]; Oxycodone; and Zofran [ondansetron]    Social History:  The patient  reports that she quit smoking about 24 years ago. Her smoking use included Cigarettes. She has a 0.20 pack-year smoking history. She has never used smokeless tobacco. She reports that she does not drink alcohol or use drugs.   Family History:  The patient's family history includes Heart disease in her mother; Hyperlipidemia in her mother.    ROS:  Please see the history of present illness.   Otherwise, review of systems are positive for none.   All other systems are reviewed and negative.    PHYSICAL EXAM: VS:  BP 104/60    Pulse 87   Ht 5' 4.5" (1.638 m)   Wt 109 lb (49.4 kg)   SpO2 98%   BMI 18.42 kg/m  , BMI Body mass index is 18.42 kg/m. GEN: Well nourished, well developed, in no acute distress  HEENT: normal  Neck: no JVD, carotid bruits, or masses Cardiac: RRR; no murmurs, rubs, or gallops,no edema  Respiratory:  clear to auscultation bilaterally, normal work of breathing GI: soft, nontender, nondistended, + BS MS: no deformity or atrophy  Skin: warm and dry, no rash Neuro:  Strength and sensation are intact Psych: euthymic mood, full affect   EKG:  EKG is not ordered today.    Recent Labs: 04/11/2016: ALT 29; BUN 12; Creatinine, Ser 0.58; Hemoglobin 12.9; Platelets 255; Potassium 3.3; Sodium 137    Lipid Panel    Component Value Date/Time   CHOL 163 02/19/2015 0939   TRIG 64 02/19/2015 0939   HDL 75 02/19/2015 0939   CHOLHDL 2.2 02/19/2015 0939   LDLCALC 75 02/19/2015 0939      Wt Readings from Last 3 Encounters:  07/15/16 109 lb (49.4 kg)  06/17/16 110 lb (49.9 kg)  04/21/16 107 lb 1.9 oz (48.6 kg)        ASSESSMENT AND PLAN:  1.  Coronary artery disease involving native coronary arteries without angina: Overall she is stable from a cardiac standpoint. She is doing well overall with no angina. Continue medical therapy.  2. Hyperlipidemia: Continue treatment with atorvastatin. Most recent LDL was 92.   3. Left arm AV fistula: Overall she is asymptomatic other than very prominent veins in the left antecubital area. Continue observation and avoid IV cannulation in the left arm.   Disposition:   FU with me in 6 months  Signed,  Lorine BearsMuhammad Keyshawna Prouse, MD  07/15/2016 10:40 AM    [facility]

## 2016-07-15 NOTE — Patient Instructions (Signed)
Medication Instructions: Continue same medications.   Labwork: None.   Procedures/Testing: None.   Follow-Up: 6 months with Dr. Arida.   Any Additional Special Instructions Will Be Listed Below (If Applicable).     If you need a refill on your cardiac medications before your next appointment, please call your pharmacy.   

## 2016-07-22 ENCOUNTER — Ambulatory Visit: Payer: Medicare Other | Admitting: Cardiovascular Disease

## 2016-07-29 ENCOUNTER — Ambulatory Visit: Payer: Medicare Other | Attending: Internal Medicine

## 2016-07-29 DIAGNOSIS — Z87898 Personal history of other specified conditions: Secondary | ICD-10-CM

## 2016-07-29 DIAGNOSIS — G4710 Hypersomnia, unspecified: Secondary | ICD-10-CM

## 2016-07-29 DIAGNOSIS — G4733 Obstructive sleep apnea (adult) (pediatric): Secondary | ICD-10-CM | POA: Insufficient documentation

## 2016-07-29 DIAGNOSIS — R0683 Snoring: Secondary | ICD-10-CM

## 2016-07-29 DIAGNOSIS — I609 Nontraumatic subarachnoid hemorrhage, unspecified: Secondary | ICD-10-CM

## 2016-08-05 ENCOUNTER — Ambulatory Visit: Payer: Medicare Other | Admitting: Pulmonary Disease

## 2016-08-12 ENCOUNTER — Telehealth: Payer: Self-pay | Admitting: *Deleted

## 2016-08-12 DIAGNOSIS — G4733 Obstructive sleep apnea (adult) (pediatric): Secondary | ICD-10-CM

## 2016-08-12 NOTE — Telephone Encounter (Signed)
Informed daughter we are placing order for CPAP. Order placed and nothing further needed.

## 2016-09-20 ENCOUNTER — Ambulatory Visit: Payer: Self-pay | Admitting: Urology

## 2016-09-23 ENCOUNTER — Ambulatory Visit: Payer: Self-pay

## 2016-09-30 ENCOUNTER — Ambulatory Visit (INDEPENDENT_AMBULATORY_CARE_PROVIDER_SITE_OTHER): Payer: Medicare Other | Admitting: Urology

## 2016-09-30 ENCOUNTER — Encounter: Payer: Self-pay | Admitting: Urology

## 2016-09-30 VITALS — BP 123/81 | Ht 64.0 in | Wt 108.4 lb

## 2016-09-30 DIAGNOSIS — R351 Nocturia: Secondary | ICD-10-CM | POA: Diagnosis not present

## 2016-09-30 DIAGNOSIS — N3944 Nocturnal enuresis: Secondary | ICD-10-CM

## 2016-09-30 DIAGNOSIS — I2510 Atherosclerotic heart disease of native coronary artery without angina pectoris: Secondary | ICD-10-CM

## 2016-09-30 LAB — URINALYSIS, COMPLETE
Bilirubin, UA: NEGATIVE
Glucose, UA: NEGATIVE
Ketones, UA: NEGATIVE
Leukocytes, UA: NEGATIVE
Nitrite, UA: NEGATIVE
Protein, UA: NEGATIVE
RBC, UA: NEGATIVE
Specific Gravity, UA: 1.015 (ref 1.005–1.030)
Urobilinogen, Ur: 1 mg/dL (ref 0.2–1.0)
pH, UA: 7 (ref 5.0–7.5)

## 2016-09-30 LAB — MICROSCOPIC EXAMINATION: Bacteria, UA: NONE SEEN

## 2016-09-30 LAB — BLADDER SCAN AMB NON-IMAGING: Scan Result: 39

## 2016-09-30 NOTE — Progress Notes (Signed)
09/30/2016 10:16 AM   Monica NonesLinda S Downs 04-09-1941 [id number]  Referring provider: Jaclyn Shaggyenny C Tate, MD [address]   [place], [place] [id number]  Chief Complaint  Patient presents with  . New Patient (Initial Visit)    urinary incontinence    HPI: The patient is a 75 year old female with multiple medical comorbidities including CAD and Alzheimer's disease on donepezil who presents today to discuss her urinary symptoms. Her biggest complaint was nocturia 3 for which she was sent to my office. However in the interim, she was diagnosed with sleep apnea and started on a CPAP machine. Her nocturia is now down to 1. She has no other urinary complaints this time. She denies daytime frequency and urgency. She feels that she empties her bladder. She denies incontinence. She is now very happy with her urinary symptoms since starting her CPAP machine the last week. She has no history of nephrolithiasis or gross hematuria.  PVR: 39 cc.     PMH: Past Medical History:  Diagnosis Date  . Allergic rhinitis   . Anxiety   . CAD (coronary artery disease)    a. 07/2007 Cath/PCI: LM nl, LAD 99p (3.0 x 18 Xience EES), D1 90ost, LCX nl, RCA nl;  b. 09/2014 MV: EF 72%, attenuation artifact, no ischemia.  . Clotting disorder (HCC)    LEGS;throat  . HLD (hyperlipidemia)   . Hypertension   . Hypothyroidism   . Incontinence   . Left Forearm AV Fistula    a. Asymptomatic.  No blood draws/IV's;  b. 06/2013 confirmed by L FA duplex.  . Mitral regurgitation    a. 02/2011 Echo: EF 72%, trace TR, mild MR/AI.  Marland Kitchen. Sleep apnea     Surgical History: Past Surgical History:  Procedure Laterality Date  . BUNIONETTE EXCISION    . CARDIAC CATHETERIZATION  07/2007   99% mid LAD stenosis  . CORONARY STENT PLACEMENT  07/2007   Mid LAD: 3.0 X 18 mm Xience stent  . MULTIPLE TOOTH EXTRACTIONS    . THYROIDECTOMY    . TOTAL KNEE ARTHROPLASTY     left    Home Medications:  Allergies as of 09/30/2016    Reactions   Azithromycin Anaphylaxis   Mobic [meloxicam] Swelling   Ondansetron Hcl Other (See Comments), Swelling   BLOOD CLOT;caused cardiac arrest BLOOD CLOT;caused cardiac arrest   Penicillins Shortness Of Breath   Percocet [oxycodone-acetaminophen] Anaphylaxis   Reclast [zoledronic Acid] Swelling   Vicodin [hydrocodone-acetaminophen] Anaphylaxis   Codeine Other (See Comments)   [facility]   Ibandronic Acid    Meloxicam    Oxycodone Swelling   Swelling of tongue   Zofran [ondansetron]    BLOOD CLOT;caused cardiac arrest      Medication List       Accurate as of 09/30/16 10:16 AM. Always use your most recent med list.          aspirin 81 MG tablet Take 81 mg by mouth daily. Reported on 04/21/2016   atorvastatin 40 MG tablet Commonly known as:  LIPITOR Take by mouth.   clindamycin 300 MG capsule Commonly known as:  CLEOCIN Take 300 mg by mouth every 6 (six) hours.   denosumab 60 MG/ML Soln injection Commonly known as:  PROLIA Inject 60 mg into the skin every 6 (six) months. Administer in upper arm, thigh, or abdomen   divalproex 250 MG DR tablet Commonly known as:  DEPAKOTE   donepezil 5 MG  tablet Commonly known as:  ARICEPT Take by mouth.   ferrous sulfate 325 (65 FE) MG tablet Take by mouth.   levothyroxine 75 MCG tablet Commonly known as:  SYNTHROID, LEVOTHROID Take by mouth.   MULTI-VITAMINS Tabs Take by mouth.   potassium chloride 10 MEQ tablet Commonly known as:  K-DUR Take 10 mEq by mouth daily.   TH FAMOTIDINE 10 10 MG tablet Generic drug:  famotidine Take 10 mg by mouth daily.   venlafaxine 75 MG tablet Commonly known as:  EFFEXOR   Vitamin D 2000 units tablet Take by mouth.       Allergies:  Allergies  Allergen Reactions  . Azithromycin Anaphylaxis  . Mobic [Meloxicam] Swelling  . Ondansetron Hcl Other (See Comments) and Swelling    BLOOD CLOT;caused cardiac arrest BLOOD  CLOT;caused cardiac arrest  . Penicillins Shortness Of Breath  . Percocet [Oxycodone-Acetaminophen] Anaphylaxis  . Reclast [Zoledronic Acid] Swelling  . Vicodin [Hydrocodone-Acetaminophen] Anaphylaxis  . Codeine Other (See Comments)    [facility]  . Ibandronic Acid   . Meloxicam   . Oxycodone Swelling    Swelling of tongue  . Zofran [Ondansetron]     BLOOD CLOT;caused cardiac arrest    Family History: Family History  Problem Relation Age of Onset  . Heart disease Mother   . Hyperlipidemia Mother   . Tuberculosis Mother   . Colon cancer Father   . Colon cancer    . Lymphoma    . Heart disease      Social History:  reports that she quit smoking about 25 years ago. Her smoking use included Cigarettes. She has a 0.20 pack-year smoking history. She has never used smokeless tobacco. She reports that she does not drink alcohol or use drugs.  ROS: UROLOGY Frequent Urination?: Yes Hard to postpone urination?: Yes Burning/pain with urination?: No Get up at night to urinate?: Yes Leakage of urine?: Yes Urine stream starts and stops?: No Trouble starting stream?: No Do you have to strain to urinate?: No Blood in urine?: No Urinary tract infection?: No Sexually transmitted disease?: No Injury to kidneys or bladder?: No Painful intercourse?: No Weak stream?: No Currently pregnant?: No Vaginal bleeding?: No Last menstrual period?: No  Gastrointestinal Nausea?: No Vomiting?: No Indigestion/heartburn?: No Diarrhea?: No Constipation?: No  Constitutional Fever: No Night sweats?: No Weight loss?: No Fatigue?: No  Skin Skin rash/lesions?: No Itching?: No  Eyes Blurred vision?: No Double vision?: No  Ears/Nose/Throat Sore throat?: No Sinus problems?: No  Hematologic/Lymphatic Swollen glands?: No Easy bruising?: Yes  Cardiovascular Leg swelling?: No Chest pain?: No  Respiratory Cough?: No Shortness of breath?:  No  Endocrine Excessive thirst?: No  Musculoskeletal Back pain?: No Joint pain?: No  Neurological Headaches?: No Dizziness?: No  Psychologic Depression?: Yes Anxiety?: Yes  Physical Exam: BP 123/81 (BP Location: Right Arm, Patient Position: Sitting)   Ht 5\' 4"  (1.626 m)   Wt 108 lb 6.4 oz (49.2 kg)   BMI 18.61 kg/m   Constitutional:  Alert and oriented, No acute distress. HEENT: [place] AT, moist mucus membranes.  Trachea midline, no masses. Cardiovascular: No clubbing, cyanosis, or edema. Respiratory: Normal respiratory effort, no increased work of breathing. GI: Abdomen is soft, nontender, nondistended, no abdominal masses GU: No CVA tenderness.  Skin: No rashes, bruises or suspicious lesions. Lymph: No cervical or inguinal adenopathy. Neurologic: Grossly intact, no focal deficits, moving all 4 extremities. Psychiatric: Normal mood and affect.  Laboratory Data: Lab  Results  Component Value Date   WBC 8.1 04/11/2016   HGB 12.9 04/11/2016   HCT 37.3 04/11/2016   MCV 89.0 04/11/2016   PLT 255 04/11/2016    Lab Results  Component Value Date   CREATININE 0.58 04/11/2016    No results found for: PSA  No results found for: TESTOSTERONE  No results found for: HGBA1C  Urinalysis    Component Value Date/Time   COLORURINE STRAW (A) 04/11/2016 0735   APPEARANCEUR CLEAR (A) 04/11/2016 0735   LABSPEC 1.004 (L) 04/11/2016 0735   PHURINE 6.0 04/11/2016 0735   GLUCOSEU NEGATIVE 04/11/2016 0735   HGBUR NEGATIVE 04/11/2016 0735   BILIRUBINUR NEGATIVE 04/11/2016 0735   KETONESUR NEGATIVE 04/11/2016 0735   PROTEINUR NEGATIVE 04/11/2016 0735   NITRITE NEGATIVE 04/11/2016 0735   LEUKOCYTESUR NEGATIVE 04/11/2016 0735     Assessment & Plan:    1. Nocturia This is secondary to her sleep apnea. It has resolved since starting her CPAP machine. No further urological intervention is indicated at this time. The patient can follow-up as needed.   Return if symptoms worsen  or fail to improve.  Hildred Laser, MD  [facility] [address] [place], [place] [address] [phone]

## 2016-11-22 ENCOUNTER — Ambulatory Visit (INDEPENDENT_AMBULATORY_CARE_PROVIDER_SITE_OTHER): Payer: Medicare Other | Admitting: Pulmonary Disease

## 2016-11-22 ENCOUNTER — Encounter: Payer: Self-pay | Admitting: Pulmonary Disease

## 2016-11-22 VITALS — BP 120/64 | HR 82 | Wt 113.0 lb

## 2016-11-22 DIAGNOSIS — G4733 Obstructive sleep apnea (adult) (pediatric): Secondary | ICD-10-CM | POA: Diagnosis not present

## 2016-11-22 NOTE — Patient Instructions (Signed)
Continue CPAP - it is OK that you use your old machine as long as it is working for you  Follow up in 6 months or as needed for any sleep or breathing problems

## 2016-11-25 NOTE — Progress Notes (Signed)
PULMONARY/SLEEP OFFICE FOLLOW UP NOTE  Requesting MD/Service: Arida. Primary MD - Arlana Pouchate Date of initial consultation: 06/17/16 Reason for consultation: Sleep apnea  PT PROFILE: [age] F remote smoker (minimal) with recent fall and traumatic SAH, history of CAD, prior diagnosis of OSA, previously intolerant to CPAP referred for further eval/mgmt of OSA  DATA: PSG 07/29/16: Severe OSA with AHI 61/hr. CPAP recommendation of 11 cm H2O Compliance report 01/21-02/19/18: Used 28/30 nights, avg 7 hr 16 mins per night. Significant leak noted. Still with AI 10.7/hr   SUBJ: Routine re-eval for OSA. Feels much improved. Better rested. Reports problems with tubing on machine (crack in tubing) which is to be replaced this week. No new complaints  Vitals:   11/22/16 1013 11/22/16 1014  BP:  120/64  Pulse:  82  SpO2:  99%  Weight: 113 lb (51.3 kg)    RA  EXAM:  Gen: WDWN, thin, no distress HEENT: WNL Lungs: breath sounds full and normal Cardiovascular: RRR, no murmurs noted Abdomen: Soft, nontender, normal BS Ext: webbed digits on toes, no clubbing, cyanosis or edema Neuro: CNs grossly intact, motor and sensory intact Skin: Limited exam, no lesions noted  DATA:   BMP Latest Ref Rng & Units 04/11/2016  Glucose 65 - 99 mg/dL 94  BUN 6 - 20 mg/dL 12  Creatinine 1.610.44 - 0.961.00 mg/dL 0.450.58  Sodium 409135 - 811145 mmol/L 137  Potassium 3.5 - 5.1 mmol/L 3.3(L)  Chloride 101 - 111 mmol/L 99(L)  CO2 22 - 32 mmol/L 31  Calcium 8.9 - 10.3 mg/dL 9.1(Y8.7(L)    CBC Latest Ref Rng & Units 04/11/2016  WBC 3.6 - 11.0 K/uL 8.1  Hemoglobin 12.0 - 16.0 g/dL 78.212.9  Hematocrit 95.635.0 - 47.0 % 37.3  Platelets 150 - 440 K/uL 255    CXR (10/30/15):  No acute disease  IMPRESSION:     ICD-9-CM ICD-10-CM   1. OSA (obstructive sleep apnea) 327.23 G47.33    She seems to be well treated based on improvement in symptoms of daytime hypersomnolence. The report indicates significant leak but I suspect this is related to crack in  tubing which is to be replaced this week per pt  PLAN:  Continue CPAP 11 cm H2O Follow up in 6 months or PRN   Billy Fischeravid Simonds, MD PCCM service Mobile [phone] Pager [phone] 11/25/2016

## 2016-12-09 ENCOUNTER — Telehealth: Payer: Self-pay | Admitting: Pulmonary Disease

## 2016-12-09 NOTE — Telephone Encounter (Signed)
Patient daughter calling to let resmed know [facility] [phone] tele   [phone] wants to have access to sleep monitoring results

## 2016-12-13 NOTE — Telephone Encounter (Signed)
LMOM for daughter to call me back in regards to the message.

## 2016-12-14 ENCOUNTER — Telehealth: Payer: Self-pay | Admitting: Pulmonary Disease

## 2016-12-14 NOTE — Telephone Encounter (Signed)
LMOM for daughter to return call. 

## 2016-12-14 NOTE — Telephone Encounter (Signed)
Spoke with daughter and she ask to have sleep study and a compliance report sent to neurologist. Will send. Will call [facility] about pt's tubing having a hole in it and call daughter back.

## 2016-12-14 NOTE — Telephone Encounter (Signed)
Sleep study faxed to neurology per daughter's request. [facility]. She will contact the daughter.  Nothing further needed.

## 2016-12-14 NOTE — Telephone Encounter (Signed)
Spoke with Misty from pulmonary and she states that she spoke with someone earlier and that she reported that the patient had 8 to 9 apnea events per her report and not that her heart was stopping. Called patients daughter and left voicemail message for her to call back.

## 2016-12-14 NOTE — Telephone Encounter (Signed)
Pt daughter calling stating she had to [facility] for [facility] was has a leak in her machine. The hose has some leaks, when she called them they mentioned to her that her Heart stopped about 8 times This was very shocking to her and it worries her  Pt has been lately telling daughter that she is more fatigue as well  Pt daughter is very concerned and was not sure who to go about this issue She is a patient of Dr Sung AmabileSimonds and of Dr Kirke CorinArida Pt daughter did say she is at work and asked to please call back ASAP and if she is not able to answer to please leave her a voice mail she will return our call Please advise

## 2016-12-14 NOTE — Telephone Encounter (Signed)
Left voicemail message to call back  

## 2016-12-15 NOTE — Telephone Encounter (Signed)
Pt daghter states per her sleep machine, her sleep machine noted her heart had stopped 8 times on Tuesday night. States pt has been fatigued, and feeling very tired, and just can t go. Pt has an appt with DR. Arida 12/19/2016

## 2016-12-15 NOTE — Telephone Encounter (Signed)
Spoke with patients daughter per release form. She states that she was concerned about her mothers increased fatigue. Let her know that she had the 8-9 episodes of apnea and she states that she misunderstood. Let her know that pulmonary would be the one to assist with her machine needs or she could call [facility]. She verbalized understanding and was appreciative for the clarification. She had no further questions at this time.

## 2016-12-15 NOTE — Telephone Encounter (Signed)
Left voicemail message to call back  

## 2016-12-16 ENCOUNTER — Telehealth: Payer: Self-pay | Admitting: Cardiovascular Disease

## 2016-12-16 NOTE — Telephone Encounter (Signed)
Patient is on divalproex 250 mg po donepezil 5 mg po daily daughter wants to make sure dr. Kirke CorinArida is aware of this for her upcoming appt on Monday.  Patient continuously falls asleep during the day and has had falls .

## 2016-12-19 ENCOUNTER — Encounter: Payer: Self-pay | Admitting: Cardiovascular Disease

## 2016-12-19 ENCOUNTER — Ambulatory Visit (INDEPENDENT_AMBULATORY_CARE_PROVIDER_SITE_OTHER): Payer: Medicare Other | Admitting: Cardiovascular Disease

## 2016-12-19 VITALS — BP 120/59 | HR 77 | Ht 65.0 in | Wt 114.2 lb

## 2016-12-19 DIAGNOSIS — E785 Hyperlipidemia, unspecified: Secondary | ICD-10-CM | POA: Diagnosis not present

## 2016-12-19 DIAGNOSIS — I2510 Atherosclerotic heart disease of native coronary artery without angina pectoris: Secondary | ICD-10-CM | POA: Diagnosis not present

## 2016-12-19 NOTE — Progress Notes (Signed)
Cardiology Office Note   Date:  12/19/2016   ID:  Monica Downs, DOB 1941/09/27, MRN [id number]  PCP:  Jaclyn Shaggy, MD  Cardiologist:   Lorine Bears, MD   Chief Complaint  Patient presents with  . other    Patient stated her sleep machine her heart stopped 8 times. Patient denies SOB and chest pain. Meds reviewed verbally with patient.       History of Present Illness: Monica Downs is a 76 y.o. female who presents for a follow-up visit regarding coronary artery disease status post angioplasty and drug-eluting stent placement to the left anterior descending artery in 2008. No cardiac events since then. 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  Most recent nuclear stress test in December 2015 showed no evidence of ischemia with normal ejection fraction.  She had a previous  fall in July which resulted in multiple injuries including left arm fractures, facial fractures and subarachnoid hemorrhage.  She informs me that she had another fall in December or January but I do not see records of that. She has been having progressive memory decline and is being seen by neurology. She is not to have sleep apnea and this is currently followed by pulmonary. There was some misunderstanding about possible cardiac pauses during sleep but it appears that they were referring to apnea episodes. She denies any chest pain, shortness of breath, syncope or presyncope.  Past Medical History:  Diagnosis Date  . Allergic rhinitis   . Anxiety   . CAD (coronary artery disease)    a. 07/2007 Cath/PCI: LM nl, LAD 99p (3.0 x 18 Xience EES), D1 90ost, LCX nl, RCA nl;  b. 09/2014 MV: EF 72%, attenuation artifact, no ischemia.  . Clotting disorder (HCC)    LEGS;throat  . HLD (hyperlipidemia)   . Hypertension   . Hypothyroidism   . Incontinence   . Left Forearm AV Fistula    a. Asymptomatic.  No blood draws/IV's;  b. 06/2013 confirmed by L FA duplex.  .  Mitral regurgitation    a. 02/2011 Echo: EF 72%, trace TR, mild MR/AI.  Marland Kitchen Sleep apnea     Past Surgical History:  Procedure Laterality Date  . BUNIONETTE EXCISION    . CARDIAC CATHETERIZATION  07/2007   99% mid LAD stenosis  . CORONARY STENT PLACEMENT  07/2007   Mid LAD: 3.0 X 18 mm Xience stent  . MULTIPLE TOOTH EXTRACTIONS    . THYROIDECTOMY    . TOTAL KNEE ARTHROPLASTY     left     Current Outpatient Prescriptions  Medication Sig Dispense Refill  . aspirin 81 MG tablet Take 81 mg by mouth daily. Reported on 04/21/2016    . atorvastatin (LIPITOR) 40 MG tablet Take by mouth.    . Cholecalciferol (VITAMIN D) 2000 units tablet Take by mouth.    . clindamycin (CLEOCIN) 300 MG capsule Take 300 mg by mouth every 6 (six) hours.    Marland Kitchen denosumab (PROLIA) 60 MG/ML SOLN injection Inject 60 mg into the skin every 6 (six) months. Administer in upper arm, thigh, or abdomen    . divalproex (DEPAKOTE) 250 MG DR tablet     . famotidine (TH FAMOTIDINE 10) 10 MG tablet Take 10 mg by mouth daily.    . ferrous sulfate 325 (65 FE) MG tablet Take by mouth.    . levothyroxine (SYNTHROID, LEVOTHROID) 75 MCG tablet Take by mouth.    Marland Kitchen  Multiple Vitamin (MULTI-VITAMINS) TABS Take by mouth.    . potassium chloride (K-DUR) 10 MEQ tablet Take 10 mEq by mouth daily.    Marland Kitchen. venlafaxine (EFFEXOR) 75 MG tablet     . donepezil (ARICEPT) 5 MG tablet Take by mouth.     No current facility-administered medications for this visit.     Allergies:   Azithromycin; Mobic [meloxicam]; Ondansetron hcl; Penicillins; Percocet [oxycodone-acetaminophen]; Reclast [zoledronic acid]; Vicodin [hydrocodone-acetaminophen]; Codeine; Ibandronic acid; Meloxicam; Oxycodone; and Zofran [ondansetron]    Social History:  The patient  reports that she quit smoking about 25 years ago. Her smoking use included Cigarettes. She has a 0.20 pack-year smoking history. She has never used smokeless tobacco. She reports that she does not drink alcohol  or use drugs.   Family History:  The patient's family history includes Colon cancer in her father; Heart disease in her mother; Hyperlipidemia in her mother; Tuberculosis in her mother.    ROS:  Please see the history of present illness.   Otherwise, review of systems are positive for none.   All other systems are reviewed and negative.    PHYSICAL EXAM: VS:  BP (!) 120/59 (BP Location: Left Arm, Patient Position: Sitting, Cuff Size: Normal)   Pulse 77   Ht 5\' 5"  (1.651 m)   Wt 114 lb 4 oz (51.8 kg)   BMI 19.01 kg/m  , BMI Body mass index is 19.01 kg/m. GEN: Well nourished, well developed, in no acute distress  HEENT: normal  Neck: no JVD, carotid bruits, or masses Cardiac: RRR; no murmurs, rubs, or gallops,no edema  Respiratory:  clear to auscultation bilaterally, normal work of breathing GI: soft, nontender, nondistended, + BS MS: no deformity or atrophy  Skin: warm and dry, no rash Neuro:  Strength and sensation are intact Psych: euthymic mood, full affect   EKG:  EKG is  ordered today. EKG showed normal sinus rhythm with incomplete right bundle branch block, minimal LVH and prolonged QT interval   Recent Labs: 04/11/2016: ALT 29; BUN 12; Creatinine, Ser 0.58; Hemoglobin 12.9; Platelets 255; Potassium 3.3; Sodium 137    Lipid Panel    Component Value Date/Time   CHOL 163 02/19/2015 0939   TRIG 64 02/19/2015 0939   HDL 75 02/19/2015 0939   CHOLHDL 2.2 02/19/2015 0939   LDLCALC 75 02/19/2015 0939      Wt Readings from Last 3 Encounters:  12/19/16 114 lb 4 oz (51.8 kg)  11/22/16 113 lb (51.3 kg)  09/30/16 108 lb 6.4 oz (49.2 kg)        ASSESSMENT AND PLAN:  1.  Coronary artery disease involving native coronary arteries without angina: Overall she is stable from a cardiac standpoint. She is doing well overall with no angina. Continue medical therapy.  No evidence of high-grade AV block or sinus node dysfunction on EKG and the patient has no syncope or  presyncope. It appears that she was having apnea episodes and not arrhythmia related issues.  2. Hyperlipidemia: Continue treatment with atorvastatin.   3. Left arm AV fistula: Overall she is asymptomatic other than very prominent veins in the left antecubital area. Continue observation and avoid IV cannulation in the left arm.  4. Progressive memory decline: This is being followed by neurology.  5. Sleep apnea: Followed by pulmonary.  Disposition:   FU with me in 6 months  Signed,  Lorine BearsMuhammad Suleiman Finigan, MD  12/19/2016 3:13 PM    [facility]

## 2016-12-19 NOTE — Patient Instructions (Signed)
Medication Instructions: Continue same medications.   Labwork: None.   Procedures/Testing: None.   Follow-Up: 6 months with Dr. Danie Hannig.   Any Additional Special Instructions Will Be Listed Below (If Applicable).     If you need a refill on your cardiac medications before your next appointment, please call your pharmacy.   

## 2017-02-03 ENCOUNTER — Ambulatory Visit: Payer: Medicare Other | Admitting: Cardiovascular Disease

## 2017-02-03 ENCOUNTER — Telehealth: Payer: Self-pay | Admitting: Pulmonary Disease

## 2017-02-03 NOTE — Telephone Encounter (Signed)
Patient denied coverage for cpap with [facility] .  Placed notice in nurse box.  Please call.

## 2017-02-03 NOTE — Telephone Encounter (Signed)
Call[facility] and spoke with Sharyl NimrodMeredith who states there is a message in patient's records stating the same paper that pt's daughter left us they have and it is on the desk of Olegario MessierKathy.    [facility] who states they are appealing again and that we can have the daughter to give her a call her at [phone].  LMOM for daughter to return call.

## 2017-02-03 NOTE — Telephone Encounter (Signed)
Spoke with daughter and she was informed to [facility] and that we would leave her papers up front for her to pick back up. Nothing further needed.

## 2017-03-23 ENCOUNTER — Other Ambulatory Visit: Payer: Self-pay | Admitting: Cardiovascular Disease

## 2017-03-24 ENCOUNTER — Ambulatory Visit: Payer: Medicare Other | Admitting: Podiatry

## 2017-04-17 ENCOUNTER — Emergency Department (HOSPITAL_COMMUNITY)
Admission: EM | Admit: 2017-04-17 | Discharge: 2017-04-17 | Disposition: A | Discharge status: Home / Self Care | Payer: Medicare Other | Attending: Emergency Medicine | Admitting: Emergency Medicine

## 2017-04-17 ENCOUNTER — Emergency Department (HOSPITAL_COMMUNITY): Payer: Medicare Other

## 2017-04-17 ENCOUNTER — Encounter (HOSPITAL_COMMUNITY): Payer: Self-pay | Admitting: Emergency Medicine

## 2017-04-17 DIAGNOSIS — F0390 Unspecified dementia without behavioral disturbance: Secondary | ICD-10-CM | POA: Insufficient documentation

## 2017-04-17 DIAGNOSIS — Z79899 Other long term (current) drug therapy: Secondary | ICD-10-CM | POA: Diagnosis not present

## 2017-04-17 DIAGNOSIS — Z7982 Long term (current) use of aspirin: Secondary | ICD-10-CM | POA: Insufficient documentation

## 2017-04-17 DIAGNOSIS — R51 Headache: Secondary | ICD-10-CM | POA: Diagnosis not present

## 2017-04-17 DIAGNOSIS — E039 Hypothyroidism, unspecified: Secondary | ICD-10-CM | POA: Diagnosis not present

## 2017-04-17 DIAGNOSIS — I10 Essential (primary) hypertension: Secondary | ICD-10-CM | POA: Insufficient documentation

## 2017-04-17 DIAGNOSIS — Z87891 Personal history of nicotine dependence: Secondary | ICD-10-CM | POA: Diagnosis not present

## 2017-04-17 DIAGNOSIS — I2510 Atherosclerotic heart disease of native coronary artery without angina pectoris: Secondary | ICD-10-CM | POA: Insufficient documentation

## 2017-04-17 DIAGNOSIS — R4182 Altered mental status, unspecified: Secondary | ICD-10-CM | POA: Diagnosis present

## 2017-04-17 DIAGNOSIS — R519 Headache, unspecified: Secondary | ICD-10-CM

## 2017-04-17 LAB — CBC WITH DIFFERENTIAL/PLATELET
Basophils Absolute: 0.1 10*3/uL (ref 0.0–0.1)
Basophils Relative: 1 %
Eosinophils Absolute: 0.3 10*3/uL (ref 0.0–0.7)
Eosinophils Relative: 4 %
HCT: 36.3 % (ref 36.0–46.0)
Hemoglobin: 11.9 g/dL — ABNORMAL LOW (ref 12.0–15.0)
Lymphocytes Relative: 21 %
Lymphs Abs: 1.9 10*3/uL (ref 0.7–4.0)
MCH: 29.7 pg (ref 26.0–34.0)
MCHC: 32.8 g/dL (ref 30.0–36.0)
MCV: 90.5 fL (ref 78.0–100.0)
Monocytes Absolute: 1.4 10*3/uL — ABNORMAL HIGH (ref 0.1–1.0)
Monocytes Relative: 16 %
Neutro Abs: 5.1 10*3/uL (ref 1.7–7.7)
Neutrophils Relative %: 58 %
Platelets: 274 10*3/uL (ref 150–400)
RBC: 4.01 MIL/uL (ref 3.87–5.11)
RDW: 13.5 % (ref 11.5–15.5)
WBC: 8.8 10*3/uL (ref 4.0–10.5)

## 2017-04-17 LAB — URINALYSIS, ROUTINE W REFLEX MICROSCOPIC
Bilirubin Urine: NEGATIVE
Glucose, UA: NEGATIVE mg/dL
Ketones, ur: NEGATIVE mg/dL
Nitrite: NEGATIVE
Protein, ur: NEGATIVE mg/dL
Specific Gravity, Urine: 1.001 — ABNORMAL LOW (ref 1.005–1.030)
Squamous Epithelial / LPF: NONE SEEN
pH: 7 (ref 5.0–8.0)

## 2017-04-17 LAB — COMPREHENSIVE METABOLIC PANEL
ALT: 21 U/L (ref 14–54)
AST: 27 U/L (ref 15–41)
Albumin: 3.7 g/dL (ref 3.5–5.0)
Alkaline Phosphatase: 64 U/L (ref 38–126)
Anion gap: 9 (ref 5–15)
BUN: 7 mg/dL (ref 6–20)
CO2: 30 mmol/L (ref 22–32)
Calcium: 8.2 mg/dL — ABNORMAL LOW (ref 8.9–10.3)
Chloride: 95 mmol/L — ABNORMAL LOW (ref 101–111)
Creatinine, Ser: 0.55 mg/dL (ref 0.44–1.00)
GFR calc Af Amer: >60 mL/min (ref >60)
GFR calc non Af Amer: >60 mL/min (ref >60)
Glucose, Bld: 88 mg/dL (ref 65–99)
Potassium: 3.5 mmol/L (ref 3.5–5.1)
Sodium: 134 mmol/L — ABNORMAL LOW (ref 135–145)
Total Bilirubin: 0.4 mg/dL (ref 0.3–1.2)
Total Protein: 6.8 g/dL (ref 6.5–8.1)

## 2017-04-17 MED ORDER — QUETIAPINE FUMARATE 25 MG PO TABS: 12.5000 mg | ORAL_TABLET | Freq: Every day | ORAL | 0 refills | Status: DC

## 2017-04-17 NOTE — ED Notes (Signed)
Pt A&OX4, ambulatory at d/c with independent steady gait, NAD 

## 2017-04-17 NOTE — ED Notes (Signed)
Pt taken to CT at this time.

## 2017-04-17 NOTE — ED Triage Notes (Signed)
Daughter stated, she's had head pain off and on for a few weeks ago and would not go to doctor. She has dementia and is Bipolar. She just seems a little out of sorts with her memory. She does have rages.

## 2017-04-17 NOTE — ED Provider Notes (Signed)
[facility]-EM[facility]ENCY DEPT Provider Note   CSN: [id number] Arrival date & time: 04/17/17  1152     History   Chief Complaint Chief Complaint  Patient presents with  . Headache    HPI Monica NonesLinda S Downs is a 76 y.o. female.  The history is provided by the patient.  Altered Mental Status   This is a new problem. The current episode started more than 1 week ago. The problem has not changed since onset.Associated symptoms include agitation. Pertinent negatives include no delusions. Her past medical history does not include seizures, hypertension, psychotropic medication treatment, head trauma or heart disease.    Worsening dementia and patient gets very agitated with with the daughter whenever the patient gets upset. She stated that she would kill herself when she was mad but she does not feel that way at this time. She had worsening anger outbursts and does have a history of bipolar. She's had cervical the past at work but the dose was too high. She has follow-up with her neurologist tomorrow about her anger outburst.  Past Medical History:  Diagnosis Date  . Allergic rhinitis   . Anxiety   . CAD (coronary artery disease)    a. 07/2007 Cath/PCI: LM nl, LAD 99p (3.0 x 18 Xience EES), D1 90ost, LCX nl, RCA nl;  b. 09/2014 MV: EF 72%, attenuation artifact, no ischemia.  . Clotting disorder (HCC)    LEGS;throat  . HLD (hyperlipidemia)   . Hypertension   . Hypothyroidism   . Incontinence   . Left Forearm AV Fistula    a. Asymptomatic.  No blood draws/IV's;  b. 06/2013 confirmed by L FA duplex.  . Mitral regurgitation    a. 02/2011 Echo: EF 72%, trace TR, mild MR/AI.  Marland Kitchen. Sleep apnea     Patient Active Problem List   Diagnosis Date Noted  . Traumatic subarachnoid hemorrhage (HCC) 04/11/2016  . 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 CAD (coronary artery disease)   . HLD (hyperlipidemia)     Past Surgical History:  Procedure Laterality Date  .  BUNIONETTE EXCISION    . CARDIAC CATHETERIZATION  07/2007   99% mid LAD stenosis  . CORONARY STENT PLACEMENT  07/2007   Mid LAD: 3.0 X 18 mm Xience stent  . MULTIPLE TOOTH EXTRACTIONS    . THYROIDECTOMY    . TOTAL KNEE ARTHROPLASTY     left    OB History    No data available       Home Medications    Prior to Admission medications   Medication Sig Start Date End Date Taking? Authorizing Provider  aspirin 81 MG tablet Take 81 mg by mouth daily. Reported on 04/21/2016   Yes [provider]  Cholecalciferol (VITAMIN D3) 1000 units CAPS Take 3,000 Units by mouth daily.   Yes [provider]  denosumab (PROLIA) 60 MG/ML SOLN injection Inject 60 mg into the skin every 6 (six) months. Administer in upper arm, thigh, or abdomen   Yes [provider]  divalproex (DEPAKOTE) 250 MG DR tablet Take 250 mg by mouth 2 (two) times daily.  09/28/16  Yes [provider]  donepezil (ARICEPT) 5 MG tablet Take 5 mg by mouth at bedtime.  09/02/16 04/17/17 Yes [provider]  ENSURE (ENSURE) Take 237 mLs by mouth 2 (two) times daily between meals.   Yes [provider]  ferrous sulfate 325 (65 FE) MG tablet Take 325 mg by mouth daily.  Yes [provider]  fluticasone (FLONASE) 50 MCG/ACT nasal spray Place 1 spray into both nostrils daily as needed for allergies. 01/21/17  Yes [provider]  levothyroxine (SYNTHROID, LEVOTHROID) 75 MCG tablet Take 75 mcg by mouth daily before breakfast.  01/08/16  Yes [provider]  venlafaxine (EFFEXOR) 75 MG tablet Take 75 mg by mouth 2 (two) times daily with a meal.  09/28/16  Yes [provider]  QUEtiapine (SEROQUEL) 25 MG tablet Take 0.5 tablets (12.5 mg total) by mouth at bedtime. 04/17/17   Judieth Mckown, Barbara Cower, MD    Family History Family History  Problem Relation Age of Onset  . Heart disease Mother   . Hyperlipidemia Mother   . Tuberculosis Mother   . Colon cancer Father     . Colon cancer Unknown   . Lymphoma Unknown   . Heart disease Unknown     Social History Social History  Substance Use Topics  . Smoking status: Former Smoker    Packs/day: 0.10    Years: 2.00    Types: Cigarettes    Quit date: 10/04/1991  . Smokeless tobacco: Never Used     Comment: smoked on and off  . Alcohol use No     Allergies   Azithromycin; Mobic [meloxicam]; Ondansetron hcl; Penicillins; Percocet [oxycodone-acetaminophen]; Reclast [zoledronic acid]; Vicodin [hydrocodone-acetaminophen]; Codeine; Ibandronic acid; Meloxicam; Oxycodone; and Zofran [ondansetron]   Review of Systems Review of Systems  Unable to perform ROS: Dementia  Neurological: Positive for headaches.  Psychiatric/Behavioral: Positive for agitation.  All other systems reviewed and are negative.    Physical Exam Updated Vital Signs BP 135/76   Pulse 73   Temp 98.1 F (36.7 C) (Oral)   Resp 16   Ht 5\' 4"  (1.626 m)   Wt 51.7 kg (114 lb)   SpO2 96%   BMI 19.57 kg/m   Physical Exam  Constitutional: She is oriented to person, place, and time. She appears well-developed and well-nourished.  HENT:  Head: Normocephalic and atraumatic.  Eyes: Conjunctivae and EOM are normal.  Neck: Normal range of motion.  Cardiovascular: Normal rate and regular rhythm.   Pulmonary/Chest: No stridor. No respiratory distress.  Abdominal: Soft. She exhibits no distension.  Musculoskeletal: Normal range of motion. She exhibits no edema or deformity.  Neurological: She is alert and oriented to person, place, and time. No cranial nerve deficit. Coordination normal.  Skin: Skin is warm and dry.  Nursing note and vitals reviewed.    ED Treatments / Results  Labs (all labs ordered are listed, but only abnormal results are displayed) Labs Reviewed  CBC WITH DIFFERENTIAL/PLATELET - Abnormal; Notable for the following:       Result Value   Hemoglobin 11.9 (*)    Monocytes Absolute 1.4 (*)    All other components  within normal limits  COMPREHENSIVE METABOLIC PANEL - Abnormal; Notable for the following:    Sodium 134 (*)    Chloride 95 (*)    Calcium 8.2 (*)    All other components within normal limits  URINALYSIS, ROUTINE W REFLEX MICROSCOPIC - Abnormal; Notable for the following:    Color, Urine STRAW (*)    Specific Gravity, Urine 1.001 (*)    Hgb urine dipstick SMALL (*)    Leukocytes, UA SMALL (*)    Bacteria, UA RARE (*)    All other components within normal limits    EKG  EKG Interpretation None       Radiology Ct Head Wo Contrast  Result  Date: 04/17/2017 CLINICAL DATA:  76 year old female with dementia. Intermittent pain. Subsequent encounter. EXAM: CT HEAD WITHOUT CONTRAST TECHNIQUE: Contiguous axial images were obtained from the base of the skull through the vertex without intravenous contrast. COMPARISON:  04/11/2016 head CT. FINDINGS: Brain: No intracranial hemorrhage or CT evidence of large acute infarct. Chronic microvascular changes. No intracranial mass lesion noted on this unenhanced exam. Vascular: Vascular calcifications. Skull: No acute abnormality. Sinuses/Orbits: No acute orbital abnormality. Visualized paranasal sinuses are clear. Other: Mastoid air cells middle ear cavities are clear. IMPRESSION: No acute intracranial abnormality. Electronically Signed   By: Lacy Duverney M.D.   On: 04/17/2017 19:28    Procedures Procedures (including critical care time)  Medications Ordered in ED Medications - No data to display   Initial Impression / Assessment and Plan / ED Course  I have reviewed the triage vital signs and the nursing notes.  Pertinent labs & imaging results that were available during my care of the patient were reviewed by me and considered in my medical decision making (see chart for details).     76 yo w/ anger outbursts and headaches associated 'being treated like a child' by her daughter. Takes seroquel at home.  Workup negative, still denying  suicidal thoughts or current intent. Will fu w/ neuro tomorrow. I will schedule seroquel for the time being to hopefully help placate her moods.   Final Clinical Impressions(s) / ED Diagnoses   Final diagnoses:  Nonintractable headache, unspecified chronicity pattern, unspecified headache type    New Prescriptions New Prescriptions   QUETIAPINE (SEROQUEL) 25 MG TABLET    Take 0.5 tablets (12.5 mg total) by mouth at bedtime.     Marily Memos, MD 04/18/17 [phone]

## 2017-06-06 ENCOUNTER — Ambulatory Visit: Payer: Medicare Other | Admitting: Pulmonary Disease

## 2017-06-07 ENCOUNTER — Ambulatory Visit (INDEPENDENT_AMBULATORY_CARE_PROVIDER_SITE_OTHER): Payer: Medicare Other | Admitting: Pulmonary Disease

## 2017-06-07 ENCOUNTER — Encounter: Payer: Self-pay | Admitting: Pulmonary Disease

## 2017-06-07 VITALS — BP 124/72 | HR 78 | Ht 64.0 in | Wt 110.0 lb

## 2017-06-07 DIAGNOSIS — I2510 Atherosclerotic heart disease of native coronary artery without angina pectoris: Secondary | ICD-10-CM | POA: Diagnosis not present

## 2017-06-07 DIAGNOSIS — G4733 Obstructive sleep apnea (adult) (pediatric): Secondary | ICD-10-CM | POA: Diagnosis not present

## 2017-06-07 NOTE — Progress Notes (Signed)
PULMONARY/SLEEP OFFICE FOLLOW UP NOTE  Requesting MD/Service: Arida. Primary MD - Arlana Pouchate Date of initial consultation: 06/17/16 Reason for consultation: Sleep apnea  PT PROFILE: Monica Downs remote smoker (minimal) with h/o fall and traumatic SAH, history of CAD, prior diagnosis of OSA, previously intolerant to CPAP referred for further eval/mgmt of OSA  DATA: PSG 07/29/16: Severe OSA with AHI 61/hr. CPAP recommendation of 11 cm H2O Compliance 01/21-02/19/18: Used 28/30 nights, avg 7 hr 16 mins per night. Significant leak noted. Still with AI 10.7/hr Compliance 08/05-09/03/18: Use 29/30 nights, > 4 hrs: 26/29 nights, avg duration 7h 12min, avg AHI 12.5/hr  INTERVAL: Suffered another fall last week, evaluated @ [facility]  SUBJ: Routine re-eval for OSA.  No new complaints. Continues to believe that she is benefited by CPAP. Daughter reports poor mask fit with significant mask leak.  OBJ: Vitals:   06/07/17 1423  Weight: 49.9 kg (110 lb)  Height: 5\' 4"  (1.626 m)  RA  EXAM:  Gen: WDWN, thin, no distress HEENT: WNL Lungs: breath sounds full, no adventitious sounds Cardiovascular: Reg, no murmurs noted Abdomen: Soft, nontender, normal BS Ext: No edema Neuro: grossly intact  DATA:   BMP Latest Ref Rng & Units 04/17/2017 04/11/2016  Glucose 65 - 99 mg/dL 88 94  BUN 6 - 20 mg/dL 7 12  Creatinine 6.960.44 - 1.00 mg/dL 2.950.55 2.840.58  Sodium 132135 - 145 mmol/L 134(L) 137  Potassium 3.5 - 5.1 mmol/L 3.5 3.3(L)  Chloride 101 - 111 mmol/L 95(L) 99(L)  CO2 22 - 32 mmol/L 30 31  Calcium 8.9 - 10.3 mg/dL 8.2(L) 8.7(L)    CBC Latest Ref Rng & Units 04/17/2017 04/11/2016  WBC 4.0 - 10.5 K/uL 8.8 8.1  Hemoglobin 12.0 - 15.0 g/dL 11.9(L) 12.9  Hematocrit 36.0 - 46.0 % 36.3 37.3  Platelets 150 - 400 K/uL 274 255    CXR:  NNF  IMPRESSION:     ICD-10-CM   1. OSA (obstructive sleep apnea) G47.33      PLAN:  [facility] After we find a proper mask, we will obtain another download from  her CPAP machine and recommend any appropriate adjustments  Follow-up in 6 months   Monica Fischeravid Jayceon Troy, MD PCCM service Mobile [phone] Pager [phone] 06/07/2017 2:29 PM

## 2017-06-07 NOTE — Patient Instructions (Signed)
Referral to mask fit clinic After we find a proper mask for you, we will obtain another download from your CPAP machine and recommend any appropriate adjustments  Follow-up in 6 months

## 2017-06-15 ENCOUNTER — Encounter: Payer: Self-pay | Admitting: Cardiovascular Disease

## 2017-06-15 ENCOUNTER — Ambulatory Visit (INDEPENDENT_AMBULATORY_CARE_PROVIDER_SITE_OTHER): Payer: Medicare Other | Admitting: Cardiovascular Disease

## 2017-06-15 VITALS — BP 120/62 | HR 74 | Ht 64.5 in | Wt 109.8 lb

## 2017-06-15 DIAGNOSIS — E785 Hyperlipidemia, unspecified: Secondary | ICD-10-CM

## 2017-06-15 DIAGNOSIS — I2510 Atherosclerotic heart disease of native coronary artery without angina pectoris: Secondary | ICD-10-CM

## 2017-06-15 NOTE — Patient Instructions (Signed)
Medication Instructions: Continue same medications.   Labwork: None.   Procedures/Testing: None.   Follow-Up: 6 months with Dr. Arida.   Any Additional Special Instructions Will Be Listed Below (If Applicable).     If you need a refill on your cardiac medications before your next appointment, please call your pharmacy.   

## 2017-06-15 NOTE — Progress Notes (Signed)
Cardiology Office Note   Date:  06/15/2017   ID:  Monica NonesLinda S Downs, DOB 08-20-1941, MRN [id number]  PCP:  Jaclyn Shaggyate, Denny C, MD  Cardiologist:   Lorine BearsMuhammad Cleophus Mendonsa, MD   Chief Complaint  Patient presents with  . other    6 month follow up. Meds reviewed by the pt. verbally. "doing well."      History of Present Illness: Monica NonesLinda S Trivett is a 76 y.o. female who presents for a follow-up visit regarding coronary artery disease status post angioplasty and drug-eluting stent placement to the left anterior descending artery in 2008. No cardiac events since then. 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  Most recent nuclear stress test in December 2015 showed no evidence of ischemia with normal ejection fraction.  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 She also has sleep apnea managed by pulmonary.   She has been doing well from a cardiac standpoint with no chest pain, shortness of breath or palpitations.  Past Medical History:  Diagnosis Date  . Allergic rhinitis   . Anxiety   . CAD (coronary artery disease)    a. 07/2007 Cath/PCI: LM nl, LAD 99p (3.0 x 18 Xience EES), D1 90ost, LCX nl, RCA nl;  b. 09/2014 MV: EF 72%, attenuation artifact, no ischemia.  . Clotting disorder (HCC)    LEGS;throat  . HLD (hyperlipidemia)   . Hypertension   . Hypothyroidism   . Incontinence   . Left Forearm AV Fistula    a. Asymptomatic.  No blood draws/IV's;  b. 06/2013 confirmed by L FA duplex.  . Mitral regurgitation    a. 02/2011 Echo: EF 72%, trace TR, mild MR/AI.  Marland Kitchen. Sleep apnea     Past Surgical History:  Procedure Laterality Date  . BUNIONETTE EXCISION    . CARDIAC CATHETERIZATION  07/2007   99% mid LAD stenosis  . CORONARY STENT PLACEMENT  07/2007   Mid LAD: 3.0 X 18 mm Xience stent  . MULTIPLE TOOTH EXTRACTIONS    . THYROIDECTOMY    . TOTAL KNEE ARTHROPLASTY       left     Current Outpatient Prescriptions  Medication Sig Dispense Refill  . aspirin 81 MG tablet Take 81 mg by mouth daily. Reported on 04/21/2016    . atorvastatin (LIPITOR) 40 MG tablet Take 40 mg by mouth daily.    . Cholecalciferol (VITAMIN D3) 1000 units CAPS Take 3,000 Units by mouth daily.    Marland Kitchen. denosumab (PROLIA) 60 MG/ML SOLN injection Inject 60 mg into the skin every 6 (six) months. Administer in upper arm, thigh, or abdomen    . divalproex (DEPAKOTE) 250 MG DR tablet Take 250 mg by mouth 2 (two) times daily.     Marland Kitchen. donepezil (ARICEPT) 5 MG tablet Take 5 mg by mouth at bedtime.     . ENSURE (ENSURE) Take 237 mLs by mouth 2 (two) times daily between meals.    . ferrous sulfate 325 (65 FE) MG tablet Take 325 mg by mouth daily.     . fluticasone (FLONASE) 50 MCG/ACT nasal spray Place 1 spray into both nostrils daily as needed for allergies.    Marland Kitchen. levothyroxine (SYNTHROID, LEVOTHROID) 75 MCG tablet Take 75 mcg by mouth daily before breakfast.     . QUEtiapine (SEROQUEL) 25 MG tablet Take 0.5 tablets (12.5 mg total) by mouth at bedtime. 30 tablet 0  .  venlafaxine (EFFEXOR) 75 MG tablet Take 75 mg by mouth 2 (two) times daily with a meal.      No current facility-administered medications for this visit.     Allergies:   Azithromycin; Mobic [meloxicam]; Ondansetron hcl; Penicillins; Percocet [oxycodone-acetaminophen]; Reclast [zoledronic acid]; Vicodin [hydrocodone-acetaminophen]; Codeine; Ibandronic acid; Meloxicam; Oxycodone; and Zofran [ondansetron]    Social History:  The patient  reports that she quit smoking about 25 years ago. Her smoking use included Cigarettes. She has a 0.20 pack-year smoking history. She has never used smokeless tobacco. She reports that she does not drink alcohol or use drugs.   Family History:  The patient's family history includes Colon cancer in her father and unknown relative; Heart disease in her mother and unknown relative; Hyperlipidemia in her  mother; Lymphoma in her unknown relative; Tuberculosis in her mother.    ROS:  Please see the history of present illness.   Otherwise, review of systems are positive for none.   All other systems are reviewed and negative.    PHYSICAL EXAM: VS:  BP 120/62 (BP Location: Left Arm, Patient Position: Sitting, Cuff Size: Normal)   Pulse 74   Ht 5' 4.5" (1.638 m)   Wt 109 lb 12 oz (49.8 kg)   BMI 18.55 kg/m  , BMI Body mass index is 18.55 kg/m. GEN: Well nourished, well developed, in no acute distress  HEENT: normal  Neck: no JVD, carotid bruits, or masses Cardiac: RRR; no murmurs, rubs, or gallops,no edema  Respiratory:  clear to auscultation bilaterally, normal work of breathing GI: soft, nontender, nondistended, + BS MS: no deformity or atrophy  Skin: warm and dry, no rash Neuro:  Strength and sensation are intact Psych: euthymic mood, full affect   EKG:  EKG is  ordered today. EKG showed normal sinus rhythm with incomplete right bundle branch block, LVH with prolonged QT. No significant changes were most recent EKG.   Recent Labs: 04/17/2017: ALT 21; BUN 7; Creatinine, Ser 0.55; Hemoglobin 11.9; Platelets 274; Potassium 3.5; Sodium 134    Lipid Panel    Component Value Date/Time   CHOL 163 02/19/2015 0939   TRIG 64 02/19/2015 0939   HDL 75 02/19/2015 0939   CHOLHDL 2.2 02/19/2015 0939   LDLCALC 75 02/19/2015 0939      Wt Readings from Last 3 Encounters:  06/15/17 109 lb 12 oz (49.8 kg)  06/07/17 110 lb (49.9 kg)  04/17/17 114 lb (51.7 kg)        ASSESSMENT AND PLAN:  1.  Coronary artery disease involving native coronary arteries without angina:  She is stable from a cardiac standpoint with no anginal symptoms. Continue medical therapy.  2. Hyperlipidemia: Continue treatment with atorvastatin. Most recent LDL was close 70.  3. Left arm AV fistula: Overall she is asymptomatic other than very prominent veins in the left antecubital area. Continue observation and  avoid IV cannulation in the left arm.  4. Sleep apnea: Followed by pulmonary.  Disposition:   FU with me in 6 months  Signed,  Lorine Bears, MD  06/15/2017 2:41 PM    [facility]

## 2017-06-16 ENCOUNTER — Ambulatory Visit: Payer: Medicare Other | Admitting: Cardiovascular Disease

## 2017-06-19 ENCOUNTER — Ambulatory Visit (HOSPITAL_BASED_OUTPATIENT_CLINIC_OR_DEPARTMENT_OTHER): Payer: Medicare Other | Attending: Pulmonary Disease | Admitting: Radiology

## 2017-06-19 DIAGNOSIS — G4733 Obstructive sleep apnea (adult) (pediatric): Secondary | ICD-10-CM

## 2017-07-03 ENCOUNTER — Telehealth: Payer: Self-pay | Admitting: Cardiovascular Disease

## 2017-07-03 NOTE — Telephone Encounter (Signed)
Pt's daughter, [name], reports pt was in [facility] ER yesterday w/TIA symptoms.  She was discharged home from the ER; no medication changes; additional outpatient testing has been ordered. Information is in Care Everywhere. Pt last saw Dr. Kirke Corin September 2018. [name] will update as necessary.

## 2017-07-03 NOTE — Telephone Encounter (Signed)
Pt daughter calling wanting to let us know  Pt had a mini stroke and was seen at [facility]  She is fine they didn't see any damage   Would like [place] to be aware

## 2017-07-06 ENCOUNTER — Other Ambulatory Visit: Payer: Self-pay | Admitting: Family Medicine

## 2017-07-06 DIAGNOSIS — Z1231 Encounter for screening mammogram for malignant neoplasm of breast: Secondary | ICD-10-CM

## 2017-07-14 ENCOUNTER — Ambulatory Visit
Admission: RE | Admit: 2017-07-14 | Discharge: 2017-07-14 | Disposition: A | Discharge status: Home / Self Care | Payer: Medicare Other | Source: Ambulatory Visit | Attending: Family Medicine | Admitting: Family Medicine

## 2017-07-14 DIAGNOSIS — Z1231 Encounter for screening mammogram for malignant neoplasm of breast: Secondary | ICD-10-CM | POA: Diagnosis not present

## 2017-10-04 ENCOUNTER — Encounter: Payer: Self-pay | Admitting: Pulmonary Disease

## 2017-10-05 ENCOUNTER — Telehealth: Payer: Self-pay | Admitting: Pulmonary Disease

## 2017-10-05 DIAGNOSIS — G4733 Obstructive sleep apnea (adult) (pediatric): Secondary | ICD-10-CM

## 2017-10-05 NOTE — Telephone Encounter (Signed)
Spoke with daughter and she states pt received her new mask at the end of September. Compliance report placed in your folder to see if you want to make any changes to her CPAP settings. Also placed order for pt to get new supplies. Please advise on any CPAP changes.

## 2017-10-05 NOTE — Telephone Encounter (Signed)
Pt daughter calling asking for orders on CPAP machine to be sent to [facility].  States they have been trying to get in touch with us  Please advise

## 2017-10-05 NOTE — Telephone Encounter (Signed)
LMOM for daughter to return call to find out what order she is referring to [facility]. Per DS' note on 06/07/17 the plan was as follows.   PLAN:  [facility] After we find a proper mask, we will obtain another download from her CPAP machine and recommend any appropriate adjustments  Follow-up in 6 months

## 2017-10-23 ENCOUNTER — Other Ambulatory Visit: Payer: Self-pay | Admitting: Pulmonary Disease

## 2017-10-23 DIAGNOSIS — G4733 Obstructive sleep apnea (adult) (pediatric): Secondary | ICD-10-CM

## 2017-10-23 NOTE — Progress Notes (Signed)
Left Message compliance report reviewed and pressure setting change has been placed to 13 m H20.

## 2017-12-29 ENCOUNTER — Ambulatory Visit: Payer: Medicare Other | Admitting: Pulmonary Disease

## 2018-01-05 ENCOUNTER — Ambulatory Visit (INDEPENDENT_AMBULATORY_CARE_PROVIDER_SITE_OTHER): Payer: Medicare Other | Admitting: Cardiovascular Disease

## 2018-01-05 ENCOUNTER — Encounter: Payer: Self-pay | Admitting: Cardiovascular Disease

## 2018-01-05 VITALS — BP 120/60 | HR 76 | Ht 63.0 in | Wt 112.5 lb

## 2018-01-05 DIAGNOSIS — E785 Hyperlipidemia, unspecified: Secondary | ICD-10-CM

## 2018-01-05 DIAGNOSIS — I2510 Atherosclerotic heart disease of native coronary artery without angina pectoris: Secondary | ICD-10-CM | POA: Diagnosis not present

## 2018-01-05 DIAGNOSIS — R0602 Shortness of breath: Secondary | ICD-10-CM | POA: Diagnosis not present

## 2018-01-05 NOTE — Progress Notes (Signed)
Cardiology Office Note   Date:  01/05/2018   ID:  Monica NonesLinda S Downs, DOB [birth year], MRN [id number]  PCP:  Jaclyn Shaggyate, Denny C, MD  Cardiologist:   Lorine BearsMuhammad Greenleigh Kauth, MD   Chief Complaint  Patient presents with  . Other    6 month follow up. Patient c/o SOB on and off but she states she does have panic attacks. Meds reviewed verbally with patient.       History of Present Illness: Monica NonesLinda S Downs is a 77 y.o. female who presents for a follow-up visit regarding coronary artery disease status post angioplasty and drug-eluting stent placement to the left anterior descending artery in 2008. No cardiac events since then. 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  Most recent nuclear stress test in December 2015 showed no evidence of ischemia with normal ejection fraction.  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 She also has sleep apnea managed by pulmonary.   No recent chest pain.  However, she reports intermittent episodes of shortness of breath with associated palpitations.  She feels as if she is having panic attacks.  She has been taking her medications regularly and her daughter helps with this.  Past Medical History:  Diagnosis Date  . Allergic rhinitis   . Anxiety   . CAD (coronary artery disease)    a. 07/2007 Cath/PCI: LM nl, LAD 99p (3.0 x 18 Xience EES), D1 90ost, LCX nl, RCA nl;  b. 09/2014 MV: EF 72%, attenuation artifact, no ischemia.  . Clotting disorder (HCC)    LEGS;throat  . HLD (hyperlipidemia)   . Hypertension   . Hypothyroidism   . Incontinence   . Left Forearm AV Fistula    a. Asymptomatic.  No blood draws/IV's;  b. 06/2013 confirmed by L FA duplex.  . Mitral regurgitation    a. 02/2011 Echo: EF 72%, trace TR, mild MR/AI.  Marland Kitchen. Sleep apnea     Past Surgical History:  Procedure Laterality Date  . BUNIONETTE EXCISION    . CARDIAC  CATHETERIZATION  07/2007   99% mid LAD stenosis  . CORONARY STENT PLACEMENT  07/2007   Mid LAD: 3.0 X 18 mm Xience stent  . MULTIPLE TOOTH EXTRACTIONS    . THYROIDECTOMY    . TOTAL KNEE ARTHROPLASTY     left     Current Outpatient Medications  Medication Sig Dispense Refill  . aspirin 81 MG tablet Take 81 mg by mouth daily. Reported on 04/21/2016    . atorvastatin (LIPITOR) 40 MG tablet Take 40 mg by mouth daily.    . Cholecalciferol (VITAMIN D3) 1000 units CAPS Take 3,000 Units by mouth daily.    Marland Kitchen. denosumab (PROLIA) 60 MG/ML SOLN injection Inject 60 mg into the skin every 6 (six) months. Administer in upper arm, thigh, or abdomen    . divalproex (DEPAKOTE) 250 MG DR tablet Take 250 mg by mouth 2 (two) times daily.     Marland Kitchen. ENSURE (ENSURE) Take 237 mLs by mouth 2 (two) times daily between meals.    . fluticasone (FLONASE) 50 MCG/ACT nasal spray Place 1 spray into both nostrils daily as needed for allergies.    Marland Kitchen. levothyroxine (SYNTHROID, LEVOTHROID) 75 MCG tablet Take 75 mcg by mouth daily before breakfast.     . QUEtiapine (SEROQUEL) 25 MG tablet Take 0.5 tablets (12.5 mg total) by mouth at bedtime. 30 tablet 0  . venlafaxine (  EFFEXOR) 75 MG tablet Take 75 mg by mouth 2 (two) times daily with a meal.     . donepezil (ARICEPT) 5 MG tablet Take 5 mg by mouth at bedtime.      No current facility-administered medications for this visit.     Allergies:   Azithromycin; Mobic [meloxicam]; Ondansetron hcl; Penicillins; Percocet [oxycodone-acetaminophen]; Reclast [zoledronic acid]; Vicodin [hydrocodone-acetaminophen]; Codeine; Ibandronic acid; Meloxicam; Oxycodone; and Zofran [ondansetron]    Social History:  The patient  reports that she quit smoking about 26 years ago. Her smoking use included cigarettes. She has a 0.20 pack-year smoking history. She has never used smokeless tobacco. She reports that she does not drink alcohol or use drugs.   Family History:  The patient's family history  includes Colon cancer in her father and unknown relative; Heart disease in her mother and unknown relative; Hyperlipidemia in her mother; Lymphoma in her unknown relative; Tuberculosis in her mother.    ROS:  Please see the history of present illness.   Otherwise, review of systems are positive for none.   All other systems are reviewed and negative.    PHYSICAL EXAM: VS:  BP 120/60 (BP Location: Left Arm, Patient Position: Sitting, Cuff Size: Normal)   Pulse 76   Ht 5\' 3"  (1.6 m)   Wt 112 lb 8 oz (51 kg)   BMI 19.93 kg/m  , BMI Body mass index is 19.93 kg/m. GEN: Well nourished, well developed, in no acute distress  HEENT: normal  Neck: no JVD, carotid bruits, or masses Cardiac: RRR; no murmurs, rubs, or gallops,no edema  Respiratory:  clear to auscultation bilaterally, normal work of breathing GI: soft, nontender, nondistended, + BS MS: no deformity or atrophy  Skin: warm and dry, no rash Neuro:  Strength and sensation are intact Psych: euthymic mood, full affect   EKG:  EKG is  ordered today. EKG showed normal sinus rhythm with incomplete right bundle branch block, LVH with prolonged QT. No significant changes were most recent EKG.   Recent Labs: 04/17/2017: ALT 21; BUN 7; Creatinine, Ser 0.55; Hemoglobin 11.9; Platelets 274; Potassium 3.5; Sodium 134    Lipid Panel    Component Value Date/Time   CHOL 163 02/19/2015 0939   TRIG 64 02/19/2015 0939   HDL 75 02/19/2015 0939   CHOLHDL 2.2 02/19/2015 0939   LDLCALC 75 02/19/2015 0939      Wt Readings from Last 3 Encounters:  01/05/18 112 lb 8 oz (51 kg)  06/15/17 109 lb 12 oz (49.8 kg)  06/07/17 110 lb (49.9 kg)        ASSESSMENT AND PLAN:  1.  Coronary artery disease involving native coronary arteries without angina: Given intermittent dyspnea and palpitations, I requested an echocardiogram to evaluate LV systolic function and pulmonary pressure.  2. Hyperlipidemia: Continue treatment with atorvastatin. Most  recent LDL was close 70.  3. Left arm AV fistula: Overall she is asymptomatic other than very prominent veins in the left antecubital area. Continue observation and avoid IV cannulation in the left arm.  4. Sleep apnea: Followed by pulmonary.  Disposition:   FU with me in 6 months  Signed,  Lorine Bears, MD  01/05/2018 2:49 PM    [facility]

## 2018-01-05 NOTE — Patient Instructions (Addendum)
Medication Instructions:  Your physician recommends that you continue on your current medications as directed. Please refer to the Current Medication list given to you today.   Labwork: none  Testing/Procedures: Your physician has requested that you have an echocardiogram. Echocardiography is a painless test that uses sound waves to create images of your heart. It provides your doctor with information about the size and shape of your heart and how well your heart's chambers and valves are working. This procedure takes approximately one hour. There are no restrictions for this procedure.    Follow-Up: Your physician wants you to follow-up in: 6 months with Dr. Arida.  You will receive a reminder letter in the mail two months in advance. If you don't receive a letter, please call our office to schedule the follow-up appointment.   Any Other Special Instructions Will Be Listed Below (If Applicable).     If you need a refill on your cardiac medications before your next appointment, please call your pharmacy.  Echocardiogram An echocardiogram, or echocardiography, uses sound waves (ultrasound) to produce an image of your heart. The echocardiogram is simple, painless, obtained within a short period of time, and offers valuable information to your health care provider. The images from an echocardiogram can provide information such as:  Evidence of coronary artery disease (CAD).  Heart size.  Heart muscle function.  Heart valve function.  Aneurysm detection.  Evidence of a past heart attack.  Fluid buildup around the heart.  Heart muscle thickening.  Assess heart valve function.  Tell a health care provider about:  Any allergies you have.  All medicines you are taking, including vitamins, herbs, eye drops, creams, and over-the-counter medicines.  Any problems you or family members have had with anesthetic medicines.  Any blood disorders you have.  Any surgeries you have  had.  Any medical conditions you have.  Whether you are pregnant or may be pregnant. What happens before the procedure? No special preparation is needed. Eat and drink normally. What happens during the procedure?  In order to produce an image of your heart, gel will be applied to your chest and a wand-like tool (transducer) will be moved over your chest. The gel will help transmit the sound waves from the transducer. The sound waves will harmlessly bounce off your heart to allow the heart images to be captured in real-time motion. These images will then be recorded.  You may need an IV to receive a medicine that improves the quality of the pictures. What happens after the procedure? You may return to your normal schedule including diet, activities, and medicines, unless your health care provider tells you otherwise. This information is not intended to replace advice given to you by your health care provider. Make sure you discuss any questions you have with your health care provider. Document Released: 09/16/2000 Document Revised: 05/07/2016 Document Reviewed: 05/27/2013 Elsevier Interactive Patient Education  2017 Elsevier Inc.  

## 2018-01-19 ENCOUNTER — Ambulatory Visit (INDEPENDENT_AMBULATORY_CARE_PROVIDER_SITE_OTHER): Payer: Medicare Other | Admitting: Pulmonary Disease

## 2018-01-19 ENCOUNTER — Encounter: Payer: Self-pay | Admitting: Pulmonary Disease

## 2018-01-19 VITALS — BP 118/68 | HR 85 | Ht 63.0 in | Wt 112.0 lb

## 2018-01-19 DIAGNOSIS — I2510 Atherosclerotic heart disease of native coronary artery without angina pectoris: Secondary | ICD-10-CM

## 2018-01-19 DIAGNOSIS — G4733 Obstructive sleep apnea (adult) (pediatric): Secondary | ICD-10-CM | POA: Diagnosis not present

## 2018-01-19 NOTE — Progress Notes (Signed)
PULMONARY/SLEEP OFFICE FOLLOW UP NOTE  Requesting MD/Service: Arida. Primary MD - Arlana Pouchate Date of initial consultation: 06/17/16 Reason for consultation: Sleep apnea  PT PROFILE: 89 F remote smoker (minimal) with h/o fall and traumatic SAH, history of CAD, prior diagnosis of OSA, previously intolerant to CPAP referred for further eval/mgmt of OSA  DATA: PSG 07/29/16: Severe OSA with AHI 61/hr. CPAP recommendation of 11 cm H2O Compliance 01/21-02/19/18: Used 28/30 nights, avg 7 hr 16 mins per night. Significant leak noted. Still with AI 10.7/hr Compliance 08/05-09/03/18: Use 29/30 nights, > 4 hrs: 26/29 nights, avg duration 7h 12min, avg AHI 12.5/hr Compliance 03/20-0418/19: Use 28/30 nights, > 4 hrs: 25/30 nights, avg duration 5h 38min, avg AHI 5.9/hr  SUBJ: Routine re-eval for OSA.  No new complaints. Continues to benefit by CPAP. Denies daytime sleepiness. No new complaints  OBJ: Vitals:   01/19/18 1127 01/19/18 1133  BP:  118/68  Pulse:  85  SpO2:  96%  Weight: 112 lb (50.8 kg)   Height: 5\' 3"  (1.6 m)   RA  EXAM:  Gen: WDWN, thin, no distress HEENT: WNL Lungs: breath sounds full, no adventitious sounds Cardiovascular: Reg, no murmurs noted Abdomen: Soft, nontender, normal BS Ext: No edema Neuro: grossly intact  DATA:   BMP Latest Ref Rng & Units 04/17/2017 04/11/2016  Glucose 65 - 99 mg/dL 88 94  BUN 6 - 20 mg/dL 7 12  Creatinine 1.910.44 - 1.00 mg/dL 4.780.55 2.950.58  Sodium 621135 - 145 mmol/L 134(L) 137  Potassium 3.5 - 5.1 mmol/L 3.5 3.3(L)  Chloride 101 - 111 mmol/L 95(L) 99(L)  CO2 22 - 32 mmol/L 30 31  Calcium 8.9 - 10.3 mg/dL 8.2(L) 8.7(L)    CBC Latest Ref Rng & Units 04/17/2017 04/11/2016  WBC 4.0 - 10.5 K/uL 8.8 8.1  Hemoglobin 12.0 - 15.0 g/dL 11.9(L) 12.9  Hematocrit 36.0 - 46.0 % 36.3 37.3  Platelets 150 - 400 K/uL 274 255    CXR:  NNF  IMPRESSION:   OSA, severe - well controlled on CPAP   PLAN:  Cont CPAP on current settings ROV 1 yr or PRN  Billy Fischeravid  Simonds, MD PCCM service Mobile [phone] Pager [phone] 01/19/2018 11:46 AM

## 2018-01-19 NOTE — Patient Instructions (Signed)
Continue CPAP Follow-up in 1 year or sooner as needed 

## 2018-01-26 DIAGNOSIS — I10 Essential (primary) hypertension: Secondary | ICD-10-CM | POA: Insufficient documentation

## 2018-01-26 DIAGNOSIS — Z8673 Personal history of transient ischemic attack (TIA), and cerebral infarction without residual deficits: Secondary | ICD-10-CM

## 2018-02-02 ENCOUNTER — Encounter

## 2018-02-02 ENCOUNTER — Ambulatory Visit (INDEPENDENT_AMBULATORY_CARE_PROVIDER_SITE_OTHER): Payer: Medicare Other

## 2018-02-02 ENCOUNTER — Other Ambulatory Visit: Payer: Self-pay

## 2018-02-02 DIAGNOSIS — R0602 Shortness of breath: Secondary | ICD-10-CM | POA: Diagnosis not present

## 2018-03-10 ENCOUNTER — Other Ambulatory Visit: Payer: Self-pay | Admitting: Cardiovascular Disease

## 2018-06-05 ENCOUNTER — Other Ambulatory Visit: Payer: Self-pay | Admitting: Family Medicine

## 2018-06-05 DIAGNOSIS — Z1231 Encounter for screening mammogram for malignant neoplasm of breast: Secondary | ICD-10-CM

## 2018-07-20 ENCOUNTER — Ambulatory Visit
Admission: RE | Admit: 2018-07-20 | Discharge: 2018-07-20 | Disposition: A | Discharge status: Home / Self Care | Payer: Medicare Other | Source: Ambulatory Visit | Attending: Family Medicine | Admitting: Family Medicine

## 2018-07-20 DIAGNOSIS — Z1231 Encounter for screening mammogram for malignant neoplasm of breast: Secondary | ICD-10-CM | POA: Diagnosis present

## 2018-08-10 ENCOUNTER — Ambulatory Visit: Payer: Medicare Other | Admitting: Nurse Practitioner

## 2018-08-24 ENCOUNTER — Ambulatory Visit (INDEPENDENT_AMBULATORY_CARE_PROVIDER_SITE_OTHER): Payer: Medicare Other | Admitting: Nurse Practitioner

## 2018-08-24 ENCOUNTER — Ambulatory Visit: Payer: Medicare Other | Admitting: Nurse Practitioner

## 2018-08-24 ENCOUNTER — Encounter: Payer: Self-pay | Admitting: Nurse Practitioner

## 2018-08-24 VITALS — BP 112/52 | HR 76 | Ht 64.5 in | Wt 109.0 lb

## 2018-08-24 DIAGNOSIS — I2510 Atherosclerotic heart disease of native coronary artery without angina pectoris: Secondary | ICD-10-CM | POA: Diagnosis not present

## 2018-08-24 DIAGNOSIS — E782 Mixed hyperlipidemia: Secondary | ICD-10-CM

## 2018-08-24 NOTE — Progress Notes (Signed)
Office Visit    Patient Name: Monica NonesLinda S Kath Date of Encounter: 08/24/2018  Primary Care Provider:  Wilford CornerWhitaker, Jason Hestle, PA-C Primary Cardiologist:  Lorine BearsMuhammad Arida, MD  Chief Complaint    77 y/o ? with a h/o coronary artery disease status post prior LAD stenting, DVT, hypertension, hyperlipidemia, hypothyroidism, mitral regurgitation, and falls with traumatic brain injury and history of subarachnoid hemorrhage in July 2017, who presents for follow-up related to coronary artery disease.  Past Medical History    Past Medical History:  Diagnosis Date  . Allergic rhinitis   . Anxiety   . CAD (coronary artery disease)    a. 07/2007 Cath/PCI: LM nl, LAD 99p (3.0 x 18 Xience EES), D1 90ost, LCX nl, RCA nl;  b. 09/2014 MV: EF 72%, attenuation artifact, no ischemia.  . Clotting disorder (HCC)    LEGS;throat  . Falls   . History of subarachnoid hemorrhage    a. 04/2016 In setting of fall-->traumatic brain injury.  Marland Kitchen. HLD (hyperlipidemia)   . Hypertension   . Hypothyroidism   . Incontinence   . Left Forearm AV Fistula    a. Asymptomatic.  No blood draws/IV's;  b. 06/2013 confirmed by L FA duplex.  . Mitral regurgitation    a. 02/2011 Echo: EF 72%, trace TR, mild MR/AI; b. 01/2018 Echo: EF 60-65%, no rwma, Gr2 DD, mild AI/MR. Nl RV fxn. Mildly dil RV/RA. Mod TR. Nl PASP.  Marland Kitchen. Obstructive sleep apnea   . Panic attacks   . Traumatic brain injury (HCC)    a. 04/2016 in setting of fall/SAH.   Past Surgical History:  Procedure Laterality Date  . BUNIONETTE EXCISION    . CARDIAC CATHETERIZATION  07/2007   99% mid LAD stenosis  . CORONARY STENT PLACEMENT  07/2007   Mid LAD: 3.0 X 18 mm Xience stent  . MULTIPLE TOOTH EXTRACTIONS    . THYROIDECTOMY    . TOTAL KNEE ARTHROPLASTY     left    Allergies  Allergies  Allergen Reactions  . Azithromycin Anaphylaxis  . Mobic [Meloxicam] Swelling  . Ondansetron Hcl Other (See Comments) and Swelling    BLOOD CLOT;caused cardiac  arrest BLOOD CLOT;caused cardiac arrest  . Penicillins Shortness Of Breath    Has patient had a PCN reaction causing immediate rash, facial/tongue/throat swelling, SOB or lightheadedness with hypotension: Yes Has patient had a PCN reaction causing severe rash involving mucus membranes or skin necrosis: No Has patient had a PCN reaction that required hospitalization: No Has patient had a PCN reaction occurring within the last 10 years: No If all of the above answers are "NO", then may proceed with Cephalosporin use.   Marland Kitchen. Percocet [Oxycodone-Acetaminophen] Anaphylaxis  . Reclast [Zoledronic Acid] Swelling  . Vicodin [Hydrocodone-Acetaminophen] Anaphylaxis  . Codeine Other (See Comments)    [facility]  . Ibandronic Acid   . Meloxicam   . Oxycodone Swelling    Swelling of tongue  . Zofran [Ondansetron]     BLOOD CLOT;caused cardiac arrest    History of Present Illness    77 year old female with the above complex past medical history including coronary artery disease status post PCI and drug-eluting stent placement to the left anterior descending artery in October 2008.  Other history includes hypertension, hyperlipidemia, DVT, hypothyroidism, mild regurgitation, and falls with traumatic brain injury and history of subarachnoid hemorrhage in July 2017.  In that setting, she has had cognitive decline.  She was last evaluated  with stress testing in the setting of progressive fatigue in December 2015 and that was nonischemic.  She was last seen in clinic in April of this year, at which time she was doing well.  Since her last visit, she is continued to do well.  She lives locally with her daughter and is relatively active.  She walks frequently and does not experience chest pain or dyspnea.  Further, she denies PND, orthopnea, dizziness, syncope, edema, or early satiety.  Home Medications    Prior to Admission medications   Medication Sig Start Date  End Date Taking? Authorizing Provider  aspirin 81 MG tablet Take 81 mg by mouth daily. Reported on 04/21/2016    [provider]  atorvastatin (LIPITOR) 40 MG tablet Take 40 mg by mouth daily.    [provider]  atorvastatin (LIPITOR) 40 MG tablet TAKE 1 TABLET BY MOUTH ONCE DAILY 03/12/18   Iran Ouch, MD  Cholecalciferol (VITAMIN D3) 1000 units CAPS Take 3,000 Units by mouth daily.    [provider]  denosumab (PROLIA) 60 MG/ML SOLN injection Inject 60 mg into the skin every 6 (six) months. Administer in upper arm, thigh, or abdomen    [provider]  divalproex (DEPAKOTE) 250 MG DR tablet Take 250 mg by mouth 2 (two) times daily.  09/28/16   [provider]  donepezil (ARICEPT) 5 MG tablet Take 5 mg by mouth at bedtime.  09/02/16 01/19/18  [provider]  ENSURE (ENSURE) Take 237 mLs by mouth 2 (two) times daily between meals.    [provider]  fluticasone (FLONASE) 50 MCG/ACT nasal spray Place 1 spray into both nostrils daily as needed for allergies. 01/21/17   [provider]  levothyroxine (SYNTHROID, LEVOTHROID) 75 MCG tablet Take 75 mcg by mouth daily before breakfast.  01/08/16   [provider]  QUEtiapine (SEROQUEL) 25 MG tablet Take 0.5 tablets (12.5 mg total) by mouth at bedtime. 04/17/17   Mesner, Barbara Cower, MD  venlafaxine (EFFEXOR) 75 MG tablet Take 75 mg by mouth 2 (two) times daily with a meal.  09/28/16   [provider]    Review of Systems    She has been doing well over the past 7 months.  She denies chest pain, palpitations, dyspnea, pnd, orthopnea, n, v, dizziness, syncope, edema, weight gain, or early satiety.  All other systems reviewed and are otherwise negative except as noted above.  Physical Exam    VS:  BP (!) 112/52 (BP Location: Right Arm, Patient Position: Sitting, Cuff Size: Normal)   Pulse 76   Ht 5' 4.5" (1.638 m)   Wt 109 lb (49.4 kg)   BMI 18.42 kg/m  , BMI Body  mass index is 18.42 kg/m. GEN: Somewhat frail, in no acute distress. HEENT: normal. Neck: Supple, no JVD, carotid bruits, or masses. Cardiac: RRR, no murmurs, rubs, or gallops. No clubbing, cyanosis, edema.  Radials/DP/PT 2+ and equal bilaterally.  Respiratory:  Respirations regular and unlabored, clear to auscultation bilaterally. GI: Soft, nontender, nondistended, BS + x 4. MS: no deformity or atrophy. Skin: warm and dry, no rash. Neuro:  Strength and sensation are intact. Psych: Normal affect.  Accessory Clinical Findings    ECG personally reviewed by me today -regular sinus rhythm, 76, incomplete right bundle branch block, nonspecific ST/T changes.  Prolonged QT.- no acute changes.  Assessment & Plan    1.  Coronary artery disease: Status post prior LAD stenting in 2008 with subsequent low risk stress  test in December 2015.  She has been doing well without any recurrent chest pain or dyspnea and remains reasonably active.  ECG stable today.  She remains on aspirin and statin therapy.  2.  Hyperlipidemia: This is followed by primary care.  She had labs in May 2019 showing a total cholesterol 170, triglycerides 71, HDL 62, LDL 94.  LFTs were within normal limits.  She remains on Lipitor therapy.  3.  Left arm AV fistula: Asymptomatic.  4.  Obstructive sleep apnea: She typically starts the night using sleep apnea but sometimes will take it off after using the bathroom.  I encouraged her to use sleep apnea during hours of sleep and her daughter who is present today says she reminds her frequently.  5.  Disposition: Follow-up in 6 months or sooner if necessary.   Nicolasa Ducking, NP 08/24/2018, 9:34 AM

## 2018-08-24 NOTE — Patient Instructions (Signed)
Medication Instructions:  - Your physician recommends that you continue on your current medications as directed. Please refer to the Current Medication list given to you today.  If you need a refill on your cardiac medications before your next appointment, please call your pharmacy.   Lab work: - none ordered  If you have labs (blood work) drawn today and your tests are completely normal, you will receive your results only by: . MyChart Message (if you have MyChart) OR . A paper copy in the mail If you have any lab test that is abnormal or we need to change your treatment, we will call you to review the results.  Testing/Procedures: - none ordered  Follow-Up: At [facility], you and your health needs are our priority.  As part of our continuing mission to provide you with exceptional heart care, we have created designated Provider Care Teams.  These Care Teams include your primary Cardiologist (physician) and Advanced Practice Providers (APPs -  Physician Assistants and Nurse Practitioners) who all work together to provide you with the care you need, when you need it. You will need a follow up appointment in 6 months.  Please call our office 2 months in advance to schedule this appointment.  You may see Muhammad Arida, MD or one of the following Advanced Practice Providers on your designated Care Team:   Christopher Berge, NP Ryan Dunn, PA-C . Jacquelyn Visser, PA-C  Any Other Special Instructions Will Be Listed Below (If Applicable). - N/A   

## 2018-12-14 ENCOUNTER — Other Ambulatory Visit: Payer: Self-pay | Admitting: Cardiovascular Disease

## 2018-12-30 DIAGNOSIS — F03C0 Unspecified dementia, severe, without behavioral disturbance, psychotic disturbance, mood disturbance, and anxiety: Secondary | ICD-10-CM

## 2018-12-30 DIAGNOSIS — F0390 Unspecified dementia without behavioral disturbance: Secondary | ICD-10-CM

## 2019-01-01 ENCOUNTER — Emergency Department
Admission: EM | Admit: 2019-01-01 | Discharge: 2019-01-03 | Disposition: A | Discharge status: Home / Self Care | Payer: Medicare Other | Attending: Emergency Medicine | Admitting: Emergency Medicine

## 2019-01-01 DIAGNOSIS — Z96652 Presence of left artificial knee joint: Secondary | ICD-10-CM | POA: Diagnosis not present

## 2019-01-01 DIAGNOSIS — E039 Hypothyroidism, unspecified: Secondary | ICD-10-CM | POA: Diagnosis not present

## 2019-01-01 DIAGNOSIS — I2510 Atherosclerotic heart disease of native coronary artery without angina pectoris: Secondary | ICD-10-CM | POA: Diagnosis not present

## 2019-01-01 DIAGNOSIS — Z7982 Long term (current) use of aspirin: Secondary | ICD-10-CM | POA: Diagnosis not present

## 2019-01-01 DIAGNOSIS — Z79899 Other long term (current) drug therapy: Secondary | ICD-10-CM | POA: Insufficient documentation

## 2019-01-01 DIAGNOSIS — I10 Essential (primary) hypertension: Secondary | ICD-10-CM | POA: Diagnosis not present

## 2019-01-01 DIAGNOSIS — R443 Hallucinations, unspecified: Secondary | ICD-10-CM | POA: Diagnosis not present

## 2019-01-01 DIAGNOSIS — F0390 Unspecified dementia without behavioral disturbance: Secondary | ICD-10-CM | POA: Diagnosis present

## 2019-01-01 DIAGNOSIS — F0391 Unspecified dementia with behavioral disturbance: Secondary | ICD-10-CM | POA: Diagnosis not present

## 2019-01-01 DIAGNOSIS — F331 Major depressive disorder, recurrent, moderate: Secondary | ICD-10-CM | POA: Diagnosis not present

## 2019-01-01 DIAGNOSIS — Z87891 Personal history of nicotine dependence: Secondary | ICD-10-CM | POA: Diagnosis not present

## 2019-01-01 DIAGNOSIS — F329 Major depressive disorder, single episode, unspecified: Secondary | ICD-10-CM | POA: Diagnosis not present

## 2019-01-01 DIAGNOSIS — F419 Anxiety disorder, unspecified: Secondary | ICD-10-CM | POA: Diagnosis not present

## 2019-01-01 DIAGNOSIS — A159 Respiratory tuberculosis unspecified: Secondary | ICD-10-CM

## 2019-01-01 DIAGNOSIS — F03918 Unspecified dementia, unspecified severity, with other behavioral disturbance: Secondary | ICD-10-CM | POA: Diagnosis present

## 2019-01-01 LAB — COMPREHENSIVE METABOLIC PANEL
ALT: 21 U/L (ref 0–44)
AST: 34 U/L (ref 15–41)
Albumin: 3.3 g/dL — ABNORMAL LOW (ref 3.5–5.0)
Alkaline Phosphatase: 61 U/L (ref 38–126)
Anion gap: 12 (ref 5–15)
BUN: 9 mg/dL (ref 8–23)
CO2: 27 mmol/L (ref 22–32)
Calcium: 8.2 mg/dL — ABNORMAL LOW (ref 8.9–10.3)
Chloride: 94 mmol/L — ABNORMAL LOW (ref 98–111)
Creatinine, Ser: 0.61 mg/dL (ref 0.44–1.00)
GFR calc Af Amer: >60 mL/min (ref >60)
GFR calc non Af Amer: >60 mL/min (ref >60)
Glucose, Bld: 162 mg/dL — ABNORMAL HIGH (ref 70–99)
Potassium: 3 mmol/L — ABNORMAL LOW (ref 3.5–5.1)
Sodium: 133 mmol/L — ABNORMAL LOW (ref 135–145)
Total Bilirubin: 0.4 mg/dL (ref 0.3–1.2)
Total Protein: 6.9 g/dL (ref 6.5–8.1)

## 2019-01-01 LAB — CBC
HCT: 33.1 % — ABNORMAL LOW (ref 36.0–46.0)
Hemoglobin: 11.1 g/dL — ABNORMAL LOW (ref 12.0–15.0)
MCH: 30.6 pg (ref 26.0–34.0)
MCHC: 33.5 g/dL (ref 30.0–36.0)
MCV: 91.2 fL (ref 80.0–100.0)
Platelets: 206 10*3/uL (ref 150–400)
RBC: 3.63 MIL/uL — ABNORMAL LOW (ref 3.87–5.11)
RDW: 12.3 % (ref 11.5–15.5)
WBC: 6.7 10*3/uL (ref 4.0–10.5)
nRBC: 0 % (ref 0.0–0.2)

## 2019-01-01 LAB — ETHANOL: Alcohol, Ethyl (B): <10 mg/dL (ref <10)

## 2019-01-01 LAB — SALICYLATE LEVEL: Salicylate Lvl: <7 mg/dL (ref 2.8–30.0)

## 2019-01-01 LAB — ACETAMINOPHEN LEVEL: Acetaminophen (Tylenol), Serum: <10 ug/mL — ABNORMAL LOW (ref 10–30)

## 2019-01-01 MED ORDER — DONEPEZIL HCL 10 MG PO TABS
10.00 | ORAL_TABLET | ORAL | Status: DC
Start: 2019-01-01 — End: 2019-01-01

## 2019-01-01 MED ORDER — QUETIAPINE FUMARATE 25 MG PO TABS
50.00 | ORAL_TABLET | ORAL | Status: DC
Start: 2018-12-31 — End: 2019-01-01

## 2019-01-01 MED ORDER — ATORVASTATIN CALCIUM 20 MG PO TABS
40.00 | ORAL_TABLET | ORAL | Status: DC
Start: 2019-01-01 — End: 2019-01-01

## 2019-01-01 MED ORDER — DOXYCYCLINE HYCLATE 100 MG PO TABS
100.00 | ORAL_TABLET | ORAL | Status: DC
Start: 2018-12-31 — End: 2019-01-01

## 2019-01-01 MED ORDER — LEVOTHYROXINE SODIUM 25 MCG PO TABS
75.00 | ORAL_TABLET | ORAL | Status: DC
Start: 2019-01-01 — End: 2019-01-01

## 2019-01-01 MED ORDER — ACETAMINOPHEN 325 MG PO TABS
650.00 | ORAL_TABLET | ORAL | Status: DC
Start: ? — End: 2019-01-01

## 2019-01-01 MED ORDER — QUETIAPINE FUMARATE 25 MG PO TABS
50.00 | ORAL_TABLET | ORAL | Status: DC
Start: ? — End: 2019-01-01

## 2019-01-01 MED ORDER — VENLAFAXINE HCL 75 MG PO TABS
75.00 | ORAL_TABLET | ORAL | Status: DC
Start: 2018-12-31 — End: 2019-01-01

## 2019-01-01 MED ORDER — FERROUS SULFATE 325 (65 FE) MG PO TABS
325.00 | ORAL_TABLET | ORAL | Status: DC
Start: 2019-01-01 — End: 2019-01-01

## 2019-01-01 MED ORDER — ASPIRIN 81 MG PO CHEW
81.00 | CHEWABLE_TABLET | ORAL | Status: DC
Start: 2019-01-01 — End: 2019-01-01

## 2019-01-01 MED ORDER — DIVALPROEX SODIUM ER 250 MG PO TB24
250.00 | ORAL_TABLET | ORAL | Status: DC
Start: 2019-01-01 — End: 2019-01-01

## 2019-01-01 NOTE — ED Triage Notes (Signed)
Patient here from home after family tried to bring her here for dementia. Patient resisted coming to ED so family obtaining IVC.
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
## 2019-01-01 NOTE — ED Notes (Signed)
Hourly rounding reveals patient in room. No complaints, stable, in no acute distress. Q15 minute rounds and monitoring via Rover and Officer to continue.   

## 2019-01-01 NOTE — ED Notes (Signed)
Daughter: Genevie Feurtado [phone]  Meds per daughter: Depakote 250mg  AM Arecept 10mg  AM Ferrus sulfate 325mg  AM Levothyroxine AM effexor 75mg  AM  Effexor 75mg  at lunch Atorvastatin 40mg  lunch  Seroquel 25mg  afternoon

## 2019-01-02 DIAGNOSIS — F03918 Unspecified dementia, unspecified severity, with other behavioral disturbance: Secondary | ICD-10-CM | POA: Diagnosis present

## 2019-01-02 DIAGNOSIS — F0391 Unspecified dementia with behavioral disturbance: Secondary | ICD-10-CM | POA: Diagnosis not present

## 2019-01-02 LAB — VALPROIC ACID LEVEL: Valproic Acid Lvl: 35 ug/mL — ABNORMAL LOW (ref 50.0–100.0)

## 2019-01-02 MED ORDER — LEVOTHYROXINE SODIUM 50 MCG PO TABS
75.0000 ug | ORAL_TABLET | Freq: Every day | ORAL | Status: DC
  Administered 2019-01-03: 08:00:00 75 ug via ORAL
  Filled 2019-01-02: qty 2

## 2019-01-02 MED ORDER — ENSURE ENLIVE PO LIQD
237.0000 mL | Freq: Two times a day (BID) | ORAL | Status: DC
  Administered 2019-01-03: 11:00:00 237 mL via ORAL

## 2019-01-02 MED ORDER — QUETIAPINE FUMARATE 25 MG PO TABS: 25.0000 mg | ORAL_TABLET | Freq: Every day | ORAL | Status: DC

## 2019-01-02 MED ORDER — ATORVASTATIN CALCIUM 20 MG PO TABS
40.0000 mg | ORAL_TABLET | Freq: Every day | ORAL | Status: DC
  Administered 2019-01-02 – 2019-01-03 (×2): 40 mg via ORAL
  Filled 2019-01-02 (×2): qty 2

## 2019-01-02 MED ORDER — VENLAFAXINE HCL 37.5 MG PO TABS: 75.0000 mg | ORAL_TABLET | Freq: Two times a day (BID) | ORAL | Status: DC

## 2019-01-02 MED ORDER — VENLAFAXINE HCL ER 75 MG PO CP24
75.0000 mg | ORAL_CAPSULE | Freq: Every day | ORAL | Status: DC
  Administered 2019-01-03: 08:00:00 75 mg via ORAL
  Filled 2019-01-02 (×2): qty 1

## 2019-01-02 MED ORDER — QUETIAPINE FUMARATE 25 MG PO TABS
25.0000 mg | ORAL_TABLET | Freq: Two times a day (BID) | ORAL | Status: DC
  Administered 2019-01-02 – 2019-01-03 (×3): 25 mg via ORAL
  Filled 2019-01-02 (×3): qty 1

## 2019-01-02 MED ORDER — DIVALPROEX SODIUM ER 250 MG PO TB24
250.0000 mg | ORAL_TABLET | Freq: Every day | ORAL | Status: DC
  Administered 2019-01-02 – 2019-01-03 (×2): 250 mg via ORAL
  Filled 2019-01-02 (×2): qty 1

## 2019-01-02 MED ORDER — DONEPEZIL HCL 5 MG PO TABS
10.0000 mg | ORAL_TABLET | ORAL | Status: DC
  Administered 2019-01-03: 08:00:00 10 mg via ORAL
  Filled 2019-01-02: qty 2

## 2019-01-02 MED ORDER — QUETIAPINE FUMARATE 25 MG PO TABS
25.0000 mg | ORAL_TABLET | Freq: Every day | ORAL | Status: DC
  Filled 2019-01-02: qty 1

## 2019-01-02 MED ORDER — ASPIRIN EC 81 MG PO TBEC
81.0000 mg | DELAYED_RELEASE_TABLET | Freq: Every day | ORAL | Status: DC
  Administered 2019-01-03: 81 mg via ORAL
  Filled 2019-01-02: qty 1

## 2019-01-02 MED ORDER — VITAMIN D 25 MCG (1000 UNIT) PO TABS
6000.0000 [IU] | ORAL_TABLET | Freq: Every day | ORAL | Status: DC
  Administered 2019-01-02 – 2019-01-03 (×2): 6000 [IU] via ORAL
  Filled 2019-01-02 (×2): qty 6

## 2019-01-02 NOTE — ED Notes (Signed)
Hourly rounding reveals patient sleeping in room. No complaints, stable, in no acute distress. Q15 minute rounds and monitoring via Rover and Officer to continue.  
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
## 2019-01-02 NOTE — ED Notes (Signed)
She is currently taking a shower  

## 2019-01-02 NOTE — ED Notes (Signed)
Pt. Currently resting in bed.  Pt. Calm and cooperative at this time.  Patients daughter called, stated pt. Is on C-pap.  Stated she can bring in.  Pt. Advised to call when she arrives in to ED and we would have staff go outside and bring to patient.

## 2019-01-02 NOTE — ED Notes (Addendum)
BEHAVIORAL HEALTH ROUNDING Patient sleeping: No. Patient alert : yes Behavior appropriate: Yes.  ; If no, describe:  Nutrition and fluids offered: yes Toileting and hygiene offered: Yes  Sitter present: q15 minute observations and security monitoring Law enforcement present: Yes  ODS  

## 2019-01-02 NOTE — ED Notes (Signed)
Her daughter has called and she talked with her on the phone  - NAD observed  Continue to monitor

## 2019-01-02 NOTE — ED Notes (Signed)
BEHAVIORAL HEALTH ROUNDING Patient sleeping: No. Patient alert : yes Behavior appropriate: Yes.  ; If no, describe:  Nutrition and fluids offered: yes Toileting and hygiene offered: Yes  Sitter present: q15 minute observations and security monitoring Law enforcement present: Yes  ODS  

## 2019-01-02 NOTE — ED Notes (Addendum)

## 2019-01-02 NOTE — ED Notes (Signed)
Pt observed lying in bed   Pt visualized with NAD  No verbalized needs or concerns at this time  Continue to monitor 

## 2019-01-02 NOTE — BH Assessment (Signed)
Pt with altered mental status. Not appropriate for TTS assessment. Pt to be referred to Social Work for memory care placement.

## 2019-01-02 NOTE — ED Notes (Signed)
ED Is the patient under IVC or is there intent for IVC: Yes.   Is the patient medically cleared: Yes.   Is there vacancy in the ED BHU: Yes.   Is the population mix appropriate for patient:  geriatric  Is the patient awaiting placement in inpatient or outpatient setting:  Social work consult pending   Has the patient had a psychiatric consult: Yes.   Survey of unit performed for contraband, proper placement and condition of furniture, tampering with fixtures in bathroom, shower, and each patient room: Yes.  ; Findings:  APPEARANCE/BEHAVIOR Calm and cooperative NEURO ASSESSMENT Orientation: oriented to self  Denies pain Hallucinations: No.None noted (Hallucinations) denies  Speech: Normal Gait: normal RESPIRATORY ASSESSMENT Even  Unlabored respirations  CARDIOVASCULAR ASSESSMENT Pulses equal   regular rate  Skin warm and dry   GASTROINTESTINAL ASSESSMENT no GI complaint EXTREMITIES Full ROM  PLAN OF CARE Provide calm/safe environment. Vital signs assessed twice daily. ED BHU Assessment once each 12-hour shift. Collaborate with TTS daily or as condition indicates. Assure the ED provider has rounded once each shift. Provide and encourage hygiene. Provide redirection as needed. Assess for escalating behavior; address immediately and inform ED provider.  Assess family dynamic and appropriateness for visitation as needed: Yes.  ; If necessary, describe findings:  Educate the patient/family about BHU procedures/visitation: Yes.  ; If necessary, describe findings:

## 2019-01-02 NOTE — ED Notes (Signed)
Pt  ivc  Pending    consult 

## 2019-01-02 NOTE — ED Notes (Addendum)
Assisted her with dressing and finishing her hygiene  -  Linens changed by Theodoro Grist  Pt awaiting memory care placement   Continue to monitor
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
## 2019-01-03 ENCOUNTER — Emergency Department: Payer: Medicare Other

## 2019-01-03 DIAGNOSIS — F331 Major depressive disorder, recurrent, moderate: Secondary | ICD-10-CM | POA: Diagnosis not present

## 2019-01-03 DIAGNOSIS — F0391 Unspecified dementia with behavioral disturbance: Secondary | ICD-10-CM

## 2019-01-03 MED ORDER — QUETIAPINE FUMARATE 25 MG PO TABS: 25.0000 mg | ORAL_TABLET | Freq: Two times a day (BID) | ORAL | 1 refills | Status: DC

## 2019-01-03 MED ORDER — VENLAFAXINE HCL ER 75 MG PO CP24: 75.0000 mg | ORAL_CAPSULE | Freq: Every day | ORAL | 1 refills | Status: DC

## 2019-01-03 MED ORDER — DOXYCYCLINE HYCLATE 100 MG PO CAPS: 100.0000 mg | ORAL_CAPSULE | Freq: Two times a day (BID) | ORAL | 0 refills | Status: AC

## 2019-01-03 MED ORDER — DIVALPROEX SODIUM ER 250 MG PO TB24: 250.0000 mg | ORAL_TABLET | ORAL | 1 refills | Status: DC

## 2019-01-03 MED ORDER — FLUTICASONE PROPIONATE 50 MCG/ACT NA SUSP
1.0000 | Freq: Every day | NASAL | Status: DC
  Administered 2019-01-03: 13:00:00 1 via NASAL
  Filled 2019-01-03: qty 16

## 2019-01-03 NOTE — ED Notes (Signed)
IVC Rescinded/ Patient being D/C'd with Daughter & Out Patient Services

## 2019-01-03 NOTE — ED Notes (Signed)
Spoke with patients daughter Monica Downs and informed her that patient has been discharged, she asked if she could get here about 530p she had to get her house prepared for her mothers return
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
## 2019-01-03 NOTE — ED Notes (Signed)
Patient discharged home with daughter, patient received discharge papers and prescription. Patient received belongings and verbalized she has received all of her belongings. Patient appropriate and cooperative, Denies SI/HI AVH. Vital signs taken. NAD noted.

## 2019-01-03 NOTE — ED Notes (Signed)
Patient has been appropriate and has been walking in and out of her room, she is restless, ready to go home. Patient continues to ask when is her daughter coming.

## 2019-01-03 NOTE — TOC Progression Note (Signed)
Transition of Care [facility]) - Progression Note    Patient Details  Name: Monica Downs MRN: [id number] Date of Birth: [birth year]  Transition of Care (TOC) CM/SW Contact  G. Tania Siler Mavis, LCSW Phone Number: [phone] 01/03/2019, 11:42 AM  Clinical Narrative:    10:57am  - CSW contacted pt's daughter [name] to discuss placement barriers. CSW left a voicemail.   CSW contacted EDP for x-ray consult order to rule out TB.    Expected Discharge Plan: Memory Care Barriers to Discharge: Barriers Unresolved (comment)(pt is IVC)  Expected Discharge Plan and Services Expected Discharge Plan: Memory Care       Living arrangements for the past 2 months: Single Family Home                           Social Determinants of Health (SDOH) Interventions    Readmission Risk Interventions No flowsheet data found.

## 2019-01-03 NOTE — ED Notes (Signed)
Patient ate 25% of breakfast.

## 2019-01-03 NOTE — Discharge Instructions (Addendum)
We are setting up home health aide social work consult to help with placement in physical and occupational therapy to help in the house.  Please return here for any further problems.

## 2019-01-03 NOTE — ED Notes (Signed)
Pt given lunch tray.

## 2019-01-03 NOTE — TOC Progression Note (Signed)
Transition of Care [facility]) - Progression Note    Patient Details  Name: Monica Downs MRN: [id number] Date of Birth: [birth year]  [facility]) CM/SW Contact  Allayne Butcher, RN Phone Number: 01/03/2019, 2:38 PM  Clinical Narrative:    RNCM spoke with patient's daughter and was able to offer home health services- daughter has no preference on agency so referral sent out to [facility].  [facility] accepts referral for RN, PT, OT, aide and SW.  Patient and daughter will need assistance with applying for Medicaid and finding Memory Care.     Expected Discharge Plan: Home w Home Health Services Barriers to Discharge: Barriers Resolved  Expected Discharge Plan and Services Expected Discharge Plan: Home w Home Health Services In-house Referral: Clinical Social Work Discharge Planning Services: CM Consult Post Acute Care Choice: Home Health Living arrangements for the past 2 months: Single Family Home                       HH [facility]   Social Determinants of Health (SDOH) Interventions    Readmission Risk Interventions No flowsheet data found.

## 2019-01-03 NOTE — ED Notes (Signed)
Pt assisted to bathroom

## 2019-01-03 NOTE — ED Notes (Signed)
Patient talking with daughter

## 2019-01-03 NOTE — ED Notes (Signed)
Offered patient a shower and she was not ready to take a shower

## 2019-01-03 NOTE — ED Provider Notes (Signed)
-----------------------------------------   6:47 AM on 01/03/2019 -----------------------------------------   Blood pressure 127/80, pulse 98, temperature 99.2 F (37.3 C), temperature source Oral, resp. rate 18, SpO2 99 %.  The patient is sleeping at this time.  There have been no acute events since the last update.  Awaiting disposition plan from Behavioral Medicine/social work team.   Irean Hong, MD 01/03/19 [phone]

## 2019-01-03 NOTE — ED Notes (Signed)
Patient talking to her husband

## 2019-01-03 NOTE — ED Notes (Signed)
Hourly rounding reveals patient sleeping in room. No complaints, stable, in no acute distress. Q15 minute rounds and monitoring via Security to continue. 

## 2019-01-03 NOTE — ED Notes (Addendum)
Pt awake in rm but did not want breakfast at this time. Tray placed on sink.

## 2019-01-03 NOTE — TOC Progression Note (Signed)
Transition of Care [facility]) - Progression Note    Patient Details  Name: Monica Downs MRN: [id number] Date of Birth: [birth year]  Transition of Care [facility]) CM/SW Contact  Tania Britanny Marksberry, LCSW  Phone Number:  [phone] 01/03/2019, 2:50 PM  Clinical Narrative:    CSW provided pt's nurse with additional resources for future ALF placement once pt receives Medicaid, to be added with AVS. Pt's daughter was notified of resources that will be provided.   Expected Discharge Plan: Home w Home Health Services Barriers to Discharge: Barriers Resolved  Expected Discharge Plan and Services Expected Discharge Plan: Home w Home Health Services In-house Referral: Clinical Social Work Discharge Planning Services: CM Consult Post Acute Care Choice: Home Health Living arrangements for the past 2 months: Single Family Home                     HH Arranged: RN, PT, Nurse's Aide, OT, Social Work [employer] Agency: [facility]   Social Determinants of Health (SDOH) Interventions    Readmission Risk Interventions No flowsheet data found.

## 2019-01-03 NOTE — TOC Transition Note (Signed)
Transition of Care [facility]) - CM/SW Discharge Note   Patient Details  Name: Monica Downs MRN: [id number] Date of Birth: 1941/01/26  Transition of Care [facility]) CM/SW Contact:  Cala Bradford, LCSW  Phone Number: [phone] 01/03/2019, 2:55 PM   Clinical Narrative:    Pt will going home with daughter, Monica Downs will be picking up pt up from the hospital. ALF resources and AVS will be provided for pt, and daughter.    Final next level of care: Home w Home Health Services Barriers to Discharge: No Barriers Identified   Patient Goals and CMS Choice Patient states their goals for this hospitalization and ongoing recovery are:: Daughter needs help with resources for long term care CMS Medicare.gov Compare Post Acute Care list provided to:: Patient Represenative (must comment)(Daughter) Choice offered to / list presented to : [facility] POA / Guardian, Adult Children  Discharge Placement                  Name of family member notified: Monica Downs; daughter/POA Patient and family notified of of transfer: 01/03/19  Discharge Plan and Services In-house Referral: Clinical Social Work Discharge Planning Services: CM Consult Post Acute Care Choice: Home Health              HH Arranged: PT, OT, Social Work, Nurse's Aide HH Agency: [facility]   Social Determinants of Health (SDOH) Interventions     Readmission Risk Interventions No flowsheet data found.

## 2019-01-03 NOTE — ED Notes (Signed)
Pt encouraged to use hand sanitizer after using bathroom, pt went back to bed.

## 2019-01-03 NOTE — ED Notes (Addendum)
Patient sitting in room, just assisted patient to the bathroom and changed her linen which had dried urine on it Gave patient her breakfast so she can eat

## 2019-01-14 ENCOUNTER — Telehealth: Payer: Self-pay | Admitting: Pulmonary Disease

## 2019-01-14 NOTE — Telephone Encounter (Signed)
lmtcb x1 for pt's daughter, [name](DPR).

## 2019-01-16 ENCOUNTER — Other Ambulatory Visit: Payer: Self-pay

## 2019-01-16 ENCOUNTER — Emergency Department
Admission: EM | Admit: 2019-01-16 | Discharge: 2019-01-18 | Disposition: A | Discharge status: Other Acute Inpatient Hospital | Payer: Medicare Other | Attending: Emergency Medicine | Admitting: Emergency Medicine

## 2019-01-16 DIAGNOSIS — R059 Cough, unspecified: Secondary | ICD-10-CM

## 2019-01-16 DIAGNOSIS — R05 Cough: Secondary | ICD-10-CM

## 2019-01-16 DIAGNOSIS — Z79899 Other long term (current) drug therapy: Secondary | ICD-10-CM | POA: Insufficient documentation

## 2019-01-16 DIAGNOSIS — F0391 Unspecified dementia with behavioral disturbance: Secondary | ICD-10-CM | POA: Insufficient documentation

## 2019-01-16 DIAGNOSIS — I10 Essential (primary) hypertension: Secondary | ICD-10-CM | POA: Diagnosis not present

## 2019-01-16 DIAGNOSIS — Z7982 Long term (current) use of aspirin: Secondary | ICD-10-CM | POA: Diagnosis not present

## 2019-01-16 DIAGNOSIS — Z87891 Personal history of nicotine dependence: Secondary | ICD-10-CM | POA: Diagnosis not present

## 2019-01-16 DIAGNOSIS — I259 Chronic ischemic heart disease, unspecified: Secondary | ICD-10-CM | POA: Insufficient documentation

## 2019-01-16 DIAGNOSIS — Z0489 Encounter for examination and observation for other specified reasons: Secondary | ICD-10-CM | POA: Diagnosis present

## 2019-01-16 DIAGNOSIS — Z87820 Personal history of traumatic brain injury: Secondary | ICD-10-CM | POA: Diagnosis not present

## 2019-01-16 DIAGNOSIS — E039 Hypothyroidism, unspecified: Secondary | ICD-10-CM | POA: Insufficient documentation

## 2019-01-16 DIAGNOSIS — F03918 Unspecified dementia, unspecified severity, with other behavioral disturbance: Secondary | ICD-10-CM | POA: Diagnosis present

## 2019-01-16 LAB — COMPREHENSIVE METABOLIC PANEL
ALT: 19 U/L (ref 0–44)
AST: 30 U/L (ref 15–41)
Albumin: 3.4 g/dL — ABNORMAL LOW (ref 3.5–5.0)
Alkaline Phosphatase: 60 U/L (ref 38–126)
Anion gap: 8 (ref 5–15)
BUN: 8 mg/dL (ref 8–23)
CO2: 31 mmol/L (ref 22–32)
Calcium: 8.2 mg/dL — ABNORMAL LOW (ref 8.9–10.3)
Chloride: 94 mmol/L — ABNORMAL LOW (ref 98–111)
Creatinine, Ser: 0.59 mg/dL (ref 0.44–1.00)
GFR calc Af Amer: >60 mL/min (ref >60)
GFR calc non Af Amer: >60 mL/min (ref >60)
Glucose, Bld: 109 mg/dL — ABNORMAL HIGH (ref 70–99)
Potassium: 3.5 mmol/L (ref 3.5–5.1)
Sodium: 133 mmol/L — ABNORMAL LOW (ref 135–145)
Total Bilirubin: 0.3 mg/dL (ref 0.3–1.2)
Total Protein: 7 g/dL (ref 6.5–8.1)

## 2019-01-16 LAB — CBC WITH DIFFERENTIAL/PLATELET
Abs Immature Granulocytes: 0.02 10*3/uL (ref 0.00–0.07)
Basophils Absolute: 0.1 10*3/uL (ref 0.0–0.1)
Basophils Relative: 1 %
Eosinophils Absolute: 0.4 10*3/uL (ref 0.0–0.5)
Eosinophils Relative: 6 %
HCT: 34 % — ABNORMAL LOW (ref 36.0–46.0)
Hemoglobin: 11.2 g/dL — ABNORMAL LOW (ref 12.0–15.0)
Immature Granulocytes: 0 %
Lymphocytes Relative: 23 %
Lymphs Abs: 1.6 10*3/uL (ref 0.7–4.0)
MCH: 30.8 pg (ref 26.0–34.0)
MCHC: 32.9 g/dL (ref 30.0–36.0)
MCV: 93.4 fL (ref 80.0–100.0)
Monocytes Absolute: 1 10*3/uL (ref 0.1–1.0)
Monocytes Relative: 15 %
Neutro Abs: 3.9 10*3/uL (ref 1.7–7.7)
Neutrophils Relative %: 55 %
Platelets: 227 10*3/uL (ref 150–400)
RBC: 3.64 MIL/uL — ABNORMAL LOW (ref 3.87–5.11)
RDW: 12.6 % (ref 11.5–15.5)
WBC: 7 10*3/uL (ref 4.0–10.5)
nRBC: 0 % (ref 0.0–0.2)

## 2019-01-16 LAB — SALICYLATE LEVEL: Salicylate Lvl: <7 mg/dL (ref 2.8–30.0)

## 2019-01-16 LAB — ACETAMINOPHEN LEVEL: Acetaminophen (Tylenol), Serum: <10 ug/mL — ABNORMAL LOW (ref 10–30)

## 2019-01-16 LAB — ETHANOL: Alcohol, Ethyl (B): <10 mg/dL (ref <10)

## 2019-01-16 NOTE — ED Notes (Signed)
Daughter is Monica Downs phone # is [phone].

## 2019-01-16 NOTE — ED Notes (Signed)
Pt. Talking to TTS and NP via [facility] machine.

## 2019-01-16 NOTE — Telephone Encounter (Signed)
lmtcb x2 for pt's daughter, [name](DPR).

## 2019-01-16 NOTE — Telephone Encounter (Signed)
Called and spoke to pt's daughter, Monica Downs had questions regarding cleaning cpap machine internally. Monica Downs is concerned that pt's machine is dirty inside, as pt has had PNA and recurrent respiratory infections.  I have suggested that Monica Downs reach out to [facility], as I am not familiar with cleaning the inside of the machine. Monica Downs voiced her understanding. Nothing further is needed at this time.

## 2019-01-16 NOTE — ED Notes (Signed)
Patients daughter took out IVC paperwork on patient for combative behavior.  Pt. Arrives to ED in calm cooperative manner.  Pt. States she lives with daughter.

## 2019-01-16 NOTE — ED Triage Notes (Signed)
PT to ED via EMS with [employer] from home. Daughter took out IVC paperwork on mother (pt) because pt was trying to leave the house. According to daughter, when she tried to stop her, pt became combative. PT calm and cooperative at this time.

## 2019-01-17 ENCOUNTER — Emergency Department: Payer: Medicare Other

## 2019-01-17 DIAGNOSIS — F0391 Unspecified dementia with behavioral disturbance: Secondary | ICD-10-CM | POA: Diagnosis not present

## 2019-01-17 LAB — URINE DRUG SCREEN, QUALITATIVE (ARMC ONLY)
Amphetamines, Ur Screen: NOT DETECTED
Barbiturates, Ur Screen: NOT DETECTED
Benzodiazepine, Ur Scrn: NOT DETECTED
Cannabinoid 50 Ng, Ur ~~LOC~~: NOT DETECTED
Cocaine Metabolite,Ur ~~LOC~~: NOT DETECTED
MDMA (Ecstasy)Ur Screen: NOT DETECTED
Methadone Scn, Ur: NOT DETECTED
Opiate, Ur Screen: NOT DETECTED
Phencyclidine (PCP) Ur S: NOT DETECTED
Tricyclic, Ur Screen: NOT DETECTED

## 2019-01-17 LAB — URINALYSIS, COMPLETE (UACMP) WITH MICROSCOPIC
Bacteria, UA: NONE SEEN
Bilirubin Urine: NEGATIVE
Glucose, UA: NEGATIVE mg/dL
Hgb urine dipstick: NEGATIVE
Ketones, ur: NEGATIVE mg/dL
Leukocytes,Ua: NEGATIVE
Nitrite: NEGATIVE
Protein, ur: NEGATIVE mg/dL
Specific Gravity, Urine: 1.009 (ref 1.005–1.030)
Squamous Epithelial / HPF: NONE SEEN (ref 0–5)
pH: 7 (ref 5.0–8.0)

## 2019-01-17 MED ORDER — DONEPEZIL HCL 5 MG PO TABS
10.0000 mg | ORAL_TABLET | Freq: Every day | ORAL | Status: DC
  Administered 2019-01-17: 10 mg via ORAL
  Filled 2019-01-17: qty 2

## 2019-01-17 MED ORDER — QUETIAPINE FUMARATE 25 MG PO TABS
25.0000 mg | ORAL_TABLET | Freq: Two times a day (BID) | ORAL | Status: DC
  Administered 2019-01-17 – 2019-01-18 (×3): 25 mg via ORAL
  Filled 2019-01-17 (×3): qty 1

## 2019-01-17 MED ORDER — VENLAFAXINE HCL ER 75 MG PO CP24
75.0000 mg | ORAL_CAPSULE | Freq: Every day | ORAL | Status: DC
  Administered 2019-01-17: 75 mg via ORAL
  Filled 2019-01-17: qty 1

## 2019-01-17 MED ORDER — VENLAFAXINE HCL ER 150 MG PO CP24
150.0000 mg | ORAL_CAPSULE | Freq: Every day | ORAL | Status: DC
  Administered 2019-01-18: 150 mg via ORAL
  Filled 2019-01-17: qty 1

## 2019-01-17 MED ORDER — VENLAFAXINE HCL ER 75 MG PO CP24
75.0000 mg | ORAL_CAPSULE | Freq: Once | ORAL | Status: AC
  Administered 2019-01-17: 75 mg via ORAL
  Filled 2019-01-17: qty 1

## 2019-01-17 MED ORDER — DIVALPROEX SODIUM ER 250 MG PO TB24
250.0000 mg | ORAL_TABLET | Freq: Every day | ORAL | Status: DC
  Administered 2019-01-17 – 2019-01-18 (×2): 250 mg via ORAL
  Filled 2019-01-17 (×2): qty 1

## 2019-01-17 NOTE — ED Notes (Signed)
While conducting home history medication interview over the phone with patient's daughter, she asked for guidance on a few matters pertaining to patient's medications.  1. She inquired whether quetiapine dose could be increased to help decrease overnight wandering. I advised that the drug is available in a variety of strengths and formulations so it would be possible for her physician/s to modify her dosing, if deemed appropriate.   2. She asked a similar question in regards to Depakote and I advised that there are different strengths and formulations available as well, so it could be possible for her physician/s to modify that dosing as well.   3. She inquired as to whether Aricept could be increased. I advised that, as far as I was aware, 10mg  daily is the maximum dose. I did inform her Candiss Norse is often prescribed concurrently and that she could discuss that possibility with care team.   4. She expressed a desire to have patient's venlafaxine remain XR formulation as she feels patient's current IR dosing has become ineffective, particularly in the later hours of the day.   ** The above is intended solely for informational and/or communicative purposes. It should in no way be considered an endorsement of any specific treatment, therapy or action. **

## 2019-01-17 NOTE — Consult Note (Signed)
[facility] Face-to-Face Psychiatry Consult   Reason for Consult:  Aggressive behavior Referring Physician: Dr. Juliette Alcide Patient Identification: Monica Downs MRN:  [id number] Principal Diagnosis: Dementia [facility]) Diagnosis:  Principal Problem:   Dementia (HCC)   Total Time spent with patient: 30 minutes  Subjective:   Monica Downs is a 78 y.o. female patient presented to [facility] ED via law enforcement under involuntary commitment status (IVC). The patient was seen face-to-face by this provider; chart reviewed and consulted with Dr. Juliette Alcide on 01/17/2019 due to the care of the patient. It was discussed with the provider that the patient does not meet criteria to be admitted to the inpatient unit. She is currently in need of a memory care facility. This patient were seen on January 01, 2019 due to her exhibiting aggressive behavior towards her daughter, wandering from her home and she has overall been difficult to have care provided to her. She has returned for similar incidence during this visit. On evaluation the patient is alert and oriented x1, calm and cooperative, and mood-congruent with affect. The patient does not appear to be responding to internal or external stimuli. The patient is presenting with delusional thinking. The patient is unable to answer most questions presented to her appropriately.   Collateral was obtained by daughter Jamora Hartline [phone] cell) or [phone] home). Who expresses concerns for her mom who needs a memory care facility placement.  Ms. Antoinique Costea discussed 3 years ago her mom was diagnosed with dementia.  Post her dementia diagnosis her mom fell on July 2019. That fall resulted in her hitting the left side of her head.  Once at the hospital it was discovered that her mom had cracked open her skull which led to her having brain damage.  The patient daughter voice "her personality is not there anymore."  Ms. [name] continues to voice "I am afraid of my mother.  I sleep with  my door lock."     Her daughter also states "I have done everything to try to get my mom in a memory home, but I have not been successful".  She explained that when her mom was in the hospital but 2 weeks ago the social worker tried getting her into the facility.  Her mom refused to go and the Child psychotherapist stated that they could not make her go.  The patient daughter was inquiring about becoming home mom guardian.  She, needs some information as to what needs to be done. The patient daughter states "It is hard keeping my mom safe and I do not know what else to do."   Plan: The patient is a safety risk due to her dementia.  She is currently requiring an inpatient memory facility in order for stabilization and treatment.  Social work consult will be placed and her daughter is requesting a phone call on her cell phone [phone] to discuss the plan of care for her mom  HPI:   Past Psychiatric History:  Anxiety  Risk to Self: Suicidal Ideation: (UTA) Suicidal Intent: (UTA) Is patient at risk for suicide?: (UTA) Suicidal Plan?: (UTA) Access to Means: (UTA) How many times?: (UTA) Risk to Others: Homicidal Ideation: (UTA) Thoughts of Harm to Others: (UTA) Current Homicidal Intent: (UTA) Current Homicidal Plan: (UTA) Access to Homicidal Means: (UTA) History of harm to others?: No Assessment of Violence: None Noted Does patient have access to weapons?: (UTA) Criminal Charges Pending?: (UTA) Does patient have a court date: (UTA) Prior Inpatient Therapy: Prior Inpatient Therapy: No  Prior Outpatient Therapy: Prior Outpatient Therapy: No Does patient have an ACCT team?: No Does patient have Intensive In-House Services?  : No Does patient have [facility] services? : No Does patient have P4CC services?: No  Past Medical History:  Past Medical History:  Diagnosis Date  . Allergic rhinitis   . Anxiety   . CAD (coronary artery disease)    a. 07/2007 Cath/PCI: LM nl, LAD 99p (3.0 x 18 Xience  EES), D1 90ost, LCX nl, RCA nl;  b. 09/2014 MV: EF 72%, attenuation artifact, no ischemia.  . Clotting disorder (HCC)    LEGS;throat  . Falls   . History of subarachnoid hemorrhage    a. 04/2016 In setting of fall-->traumatic brain injury.  Marland Kitchen HLD (hyperlipidemia)   . Hypertension   . Hypothyroidism   . Incontinence   . Left Forearm AV Fistula    a. Asymptomatic.  No blood draws/IV's;  b. 06/2013 confirmed by L FA duplex.  . Mitral regurgitation    a. 02/2011 Echo: EF 72%, trace TR, mild MR/AI; b. 01/2018 Echo: EF 60-65%, no rwma, Gr2 DD, mild AI/MR. Nl RV fxn. Mildly dil RV/RA. Mod TR. Nl PASP.  Marland Kitchen Obstructive sleep apnea   . Panic attacks   . Traumatic brain injury (HCC)    a. 04/2016 in setting of fall/SAH.    Past Surgical History:  Procedure Laterality Date  . BUNIONETTE EXCISION    . CARDIAC CATHETERIZATION  07/2007   99% mid LAD stenosis  . CORONARY STENT PLACEMENT  07/2007   Mid LAD: 3.0 X 18 mm Xience stent  . MULTIPLE TOOTH EXTRACTIONS    . THYROIDECTOMY    . TOTAL KNEE ARTHROPLASTY     left   Family History:  Family History  Problem Relation Age of Onset  . Heart disease Mother   . Hyperlipidemia Mother   . Tuberculosis Mother   . Colon cancer Father   . Colon cancer Unknown   . Lymphoma Unknown   . Heart disease Unknown   . Breast cancer Neg Hx    Family Psychiatric  History:  History reviewed. No pertinent family history Social History:  Social History   Substance and Sexual Activity  Alcohol Use No     Social History   Substance and Sexual Activity  Drug Use No    Social History   Socioeconomic History  . Marital status: Widowed    Spouse name: Not on file  . Number of children: 1  . Years of education: Not on file  . Highest education level: Not on file  Occupational History  . Occupation: retired  Engineer, production  . Financial resource strain: Not on file  . Food insecurity:    Worry: Not on file    Inability: Not on file  . Transportation  needs:    Medical: Not on file    Non-medical: Not on file  Tobacco Use  . Smoking status: Former Smoker    Packs/day: 0.10    Years: 2.00    Pack years: 0.20    Types: Cigarettes    Last attempt to quit: 10/04/1991    Years since quitting: 27.3  . Smokeless tobacco: Never Used  . Tobacco comment: smoked on and off  Substance and Sexual Activity  . Alcohol use: No  . Drug use: No  . Sexual activity: Not on file  Lifestyle  . Physical activity:    Days per week: Not on file    Minutes per session: Not on  file  . Stress: Not on file  Relationships  . Social connections:    Talks on phone: Not on file    Gets together: Not on file    Attends religious service: Not on file    Active member of club or organization: Not on file    Attends meetings of clubs or organizations: Not on file    Relationship status: Not on file  Other Topics Concern  . Not on file  Social History Narrative  . Not on file   Additional Social History:    Allergies:   Allergies  Allergen Reactions  . Azithromycin Anaphylaxis  . Mobic [Meloxicam] Swelling  . Ondansetron Hcl Other (See Comments) and Swelling    BLOOD CLOT;caused cardiac arrest BLOOD CLOT;caused cardiac arrest  . Penicillins Shortness Of Breath    Has patient had a PCN reaction causing immediate rash, facial/tongue/throat swelling, SOB or lightheadedness with hypotension: Yes Has patient had a PCN reaction causing severe rash involving mucus membranes or skin necrosis: No Has patient had a PCN reaction that required hospitalization: No Has patient had a PCN reaction occurring within the last 10 years: No If all of the above answers are "NO", then may proceed with Cephalosporin use.   Marland Kitchen. Percocet [Oxycodone-Acetaminophen] Anaphylaxis  . Reclast [Zoledronic Acid] Swelling  . Vicodin [Hydrocodone-Acetaminophen] Anaphylaxis  . Codeine Other (See Comments)    [facility]  . Ibandronic Acid    . Meloxicam   . Oxycodone Swelling    Swelling of tongue  . Zofran [Ondansetron]     BLOOD CLOT;caused cardiac arrest    Labs:  Results for orders placed or performed during the hospital encounter of 01/16/19 (from the past 48 hour(s))  Ethanol     Status: None   Collection Time: 01/16/19 10:32 PM  Result Value Ref Range   Alcohol, Ethyl (B) <10 <10 mg/dL    Comment: (NOTE) Lowest detectable limit for serum alcohol is 10 mg/dL. For medical purposes only. Performed at [facility], [address] [place], [place] [address]   CBC with Differential     Status: Abnormal   Collection Time: 01/16/19 10:32 PM  Result Value Ref Range   WBC 7.0 4.0 - 10.5 K/uL   RBC 3.64 (L) 3.87 - 5.11 MIL/uL   Hemoglobin 11.2 (L) 12.0 - 15.0 g/dL   HCT 91.434.0 (L) 78.236.0 - 95.646.0 %   MCV 93.4 80.0 - 100.0 fL   MCH 30.8 26.0 - 34.0 pg   MCHC 32.9 30.0 - 36.0 g/dL   RDW 21.312.6 08.611.5 - 57.815.5 %   Platelets 227 150 - 400 K/uL   nRBC 0.0 0.0 - 0.2 %   Neutrophils Relative % 55 %   Neutro Abs 3.9 1.7 - 7.7 K/uL   Lymphocytes Relative 23 %   Lymphs Abs 1.6 0.7 - 4.0 K/uL   Monocytes Relative 15 %   Monocytes Absolute 1.0 0.1 - 1.0 K/uL   Eosinophils Relative 6 %   Eosinophils Absolute 0.4 0.0 - 0.5 K/uL   Basophils Relative 1 %   Basophils Absolute 0.1 0.0 - 0.1 K/uL   Immature Granulocytes 0 %   Abs Immature Granulocytes 0.02 0.00 - 0.07 K/uL    Comment: Performed at [facility], [address] [place], [place] [address]  Acetaminophen level     Status: Abnormal   Collection Time: 01/16/19 10:32 PM  Result Value Ref Range   Acetaminophen (Tylenol), Serum <  10 (L) 10 - 30 ug/mL    Comment: (NOTE) Therapeutic concentrations vary significantly. A range of 10-30 ug/mL  may be an effective concentration for many patients. However, some  are best treated at concentrations outside of this range. Acetaminophen concentrations >150 ug/mL at 4 hours after ingestion  and >50 ug/mL at 12 hours  after ingestion are often associated with  toxic reactions. Performed at [facility], [address] [place], [place] [address]   Salicylate level     Status: None   Collection Time: 01/16/19 10:32 PM  Result Value Ref Range   Salicylate Lvl <7.0 2.8 - 30.0 mg/dL    Comment: Performed at [facility], [address] [place], [place] [address]  Comprehensive metabolic panel     Status: Abnormal   Collection Time: 01/16/19 10:32 PM  Result Value Ref Range   Sodium 133 (L) 135 - 145 mmol/L   Potassium 3.5 3.5 - 5.1 mmol/L   Chloride 94 (L) 98 - 111 mmol/L   CO2 31 22 - 32 mmol/L   Glucose, Bld 109 (H) 70 - 99 mg/dL   BUN 8 8 - 23 mg/dL   Creatinine, Ser 2.53 0.44 - 1.00 mg/dL   Calcium 8.2 (L) 8.9 - 10.3 mg/dL   Total Protein 7.0 6.5 - 8.1 g/dL   Albumin 3.4 (L) 3.5 - 5.0 g/dL   AST 30 15 - 41 U/L   ALT 19 0 - 44 U/L   Alkaline Phosphatase 60 38 - 126 U/L   Total Bilirubin 0.3 0.3 - 1.2 mg/dL   GFR calc non Af Amer >60 >60 mL/min   GFR calc Af Amer >60 >60 mL/min   Anion gap 8 5 - 15    Comment: Performed at [facility], [address] [place], [place] [address]    No current facility-administered medications for this encounter.    Current Outpatient Medications  Medication Sig Dispense Refill  . aspirin 81 MG tablet Take 81 mg by mouth daily. Reported on 04/21/2016    . atorvastatin (LIPITOR) 40 MG tablet TAKE ONE TABLET BY MOUTH EVERY DAY 90 tablet 3  . Cholecalciferol (VITAMIN D3) 1000 units CAPS Take 6,000 Units by mouth daily.     Marland Kitchen denosumab (PROLIA) 60 MG/ML SOLN injection Inject 60 mg into the skin every 6 (six) months. Administer in upper arm, thigh, or abdomen    . divalproex (DEPAKOTE ER) 250 MG 24 hr tablet Take 1 tablet (250 mg total) by mouth every morning. 30 tablet 1  . donepezil (ARICEPT) 10 MG tablet Take 10 mg by mouth every morning.     Marland Kitchen ENSURE (ENSURE) Take 237 mLs by mouth 2 (two) times daily between meals.    .  fluticasone (FLONASE) 50 MCG/ACT nasal spray Place 1 spray into both nostrils daily as needed for allergies.    Marland Kitchen levothyroxine (SYNTHROID, LEVOTHROID) 75 MCG tablet Take 75 mcg by mouth daily before breakfast.     . QUEtiapine (SEROQUEL) 25 MG tablet Take 1 tablet (25 mg total) by mouth 2 (two) times daily. In afternoon and at night 60 tablet 1  . venlafaxine XR (EFFEXOR-XR) 75 MG 24 hr capsule Take 1 capsule (75 mg total) by mouth daily with breakfast. 30 capsule 1    Musculoskeletal: Strength & Muscle Tone: within normal limits Gait & Station: normal Patient leans: N/A  Psychiatric Specialty Exam: Physical Exam  Nursing note and vitals reviewed. Constitutional: She appears well-developed and well-nourished.  HENT:  Head: Normocephalic and atraumatic.  Eyes: Pupils are equal, round, and reactive to light. Conjunctivae and EOM are normal.  Neck: Normal range of motion. Neck supple.  Cardiovascular: Normal rate and regular rhythm.  Respiratory: Effort normal and breath sounds normal.  Musculoskeletal: Normal range of motion.  Neurological: She is alert.  Skin: Skin is warm and dry.    Review of Systems  Psychiatric/Behavioral: Positive for depression, hallucinations and memory loss. Negative for substance abuse and suicidal ideas. The patient is nervous/anxious and has insomnia.   All other systems reviewed and are negative.   Blood pressure 128/72, pulse 79, temperature 98.1 F (36.7 C), temperature source Oral, resp. rate 16, height  (1.651 m), weight 45.4 kg, SpO2 100 %.Body mass index is 16.64 kg/m.  General Appearance: Fairly Groomed  Eye Contact:  Fair  Speech:  Blocked  Volume:  Decreased  Mood:  Anxious and Depressed  Affect:  Blunt  Thought Process:  Disorganized  Orientation:  Other:  Alert  Thought Content:  Illogical and Delusions  Suicidal Thoughts:  Unable to assess  Homicidal Thoughts:  Unable to assess  Memory:  Recent;   Poor  Judgement:  Poor   Insight:  Lacking  Psychomotor Activity:  Decreased  Concentration:  Concentration: Poor  Recall:  Poor  Fund of Knowledge:  Poor  Language:  Fair  Akathisia:  NA  Handed:  Right  AIMS (if indicated):     Assets:  Financial Resources/Insurance Housing  ADL's:  Intact  Cognition:  Impaired,  Severe  Sleep:        Treatment Plan Summary: Daily contact with patient to assess and evaluate symptoms and progress in treatment and Medication management  Disposition: No evidence of imminent risk to self or others at present.   Patient does not meet criteria for psychiatric inpatient admission. Supportive therapy provided about ongoing stressors. Patient requires social work assistance in helping to locate an inpatient facility  Catalina Gravel, NP 01/17/2019 4:12 AM

## 2019-01-17 NOTE — Consult Note (Addendum)
[facility] Face-to-Face Psychiatry Consult   Reason for Consult: Dementia Referring Physician: Emergency room physician Patient Identification: Monica Downs MRN:  [id number] Principal Diagnosis: Dementia [facility]) Diagnosis:  Principal Problem:   Dementia (HCC)   Total Time spent with patient: 20 minutes  Subjective:   Monica Downs is a 78 y.o. female patient admitted with dementia.  HPI: Patient is seen and examined.  Patient is a 78 year old female with a past psychiatric history significant for dementia.  Review of the electronic medical record revealed her last neurology appointment was on 11/02/2018.  She has been diagnosed with probable Alzheimer's disease.  She is followed at the [facility] clinic.  She presented to the [facility] emergency department on 01/17/2019 after being placed under involuntary commitment by her daughter.  The patient is significantly demented, and discussed with me that she her daughter was telling her what to do, and she was unable to cope with that very well.  Unfortunately she is oriented only to self and that this is a hospital.  Review of the electronic medical record revealed that the earlier behavioral health assessment included speaking to the daughter.  They stated that the patient had been diagnosed with dementia approximately 3 years ago.  She had been at the sister's residence for the last 3 years.  Unfortunately her memory has continued to decline especially over the last several weeks.  It was also reported that the patient often becomes combative and that her aggressive behavior is unmanageable.  At least according to the electronic medical record and care everywhere it does not appear as though she has been admitted to a psychiatric facility in the past.  She did have an emergency room visit on 01/01/2019.  She was seen by psychiatry at that time.  Seroquel was added to her current regimen which included Depakote and venlafaxine.  The patient  herself is not a great historian, and is really unable to provide additional history.  It was felt the patient did not meet criteria for psychiatric inpatient admission.  Again, Seroquel 25 mg p.o. twice in the afternoon and at bedtime to decrease her sundowning and agitation.  This morning she is sleeping and easily aroused.  She was not displaying any aggressive behavior at this time.  Review of her laboratories revealed a mild anemia, Depakote level of 35, and the rest of her laboratories were essentially normal.  Urinalysis was not available at time of examination.  Past Psychiatric History: Diagnosed with dementia approximately 3 years ago.  No previous psychiatric admissions.  She has been evaluated psychiatrically in the emergency department.  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  : No Does patient have [facility] services? : No Does patient have P4CC services?: No  Past Medical History:  Past Medical History:  Diagnosis Date  . Allergic rhinitis   . Anxiety   . CAD (coronary artery disease)  a. 07/2007 Cath/PCI: LM nl, LAD 99p (3.0 x 18 Xience EES), D1 90ost, LCX nl, RCA nl;  b. 09/2014 MV: EF 72%, attenuation artifact, no ischemia.  . Clotting disorder (HCC)    LEGS;throat  . Falls   . History of subarachnoid  hemorrhage    a. 04/2016 In setting of fall-->traumatic brain injury.  Marland Kitchen HLD (hyperlipidemia)   . Hypertension   . Hypothyroidism   . Incontinence   . Left Forearm AV Fistula    a. Asymptomatic.  No blood draws/IV's;  b. 06/2013 confirmed by L FA duplex.  . Mitral regurgitation    a. 02/2011 Echo: EF 72%, trace TR, mild MR/AI; b. 01/2018 Echo: EF 60-65%, no rwma, Gr2 DD, mild AI/MR. Nl RV fxn. Mildly dil RV/RA. Mod TR. Nl PASP.  Marland Kitchen Obstructive sleep apnea   . Panic attacks   . Traumatic brain injury (HCC)    a. 04/2016 in setting of fall/SAH.    Past Surgical History:  Procedure Laterality Date  . BUNIONETTE EXCISION    . CARDIAC CATHETERIZATION  07/2007   99% mid LAD stenosis  . CORONARY STENT PLACEMENT  07/2007   Mid LAD: 3.0 X 18 mm Xience stent  . MULTIPLE TOOTH EXTRACTIONS    . THYROIDECTOMY    . TOTAL KNEE ARTHROPLASTY     left   Family History:  Family History  Problem Relation Age of Onset  . Heart disease Mother   . Hyperlipidemia Mother   . Tuberculosis Mother   . Colon cancer Father   . Colon cancer Unknown   . Lymphoma Unknown   . Heart disease Unknown   . Breast cancer Neg Hx    Family Psychiatric  History: Noncontributory Social History:  Social History   Substance and Sexual Activity  Alcohol Use No     Social History   Substance and Sexual Activity  Drug Use No    Social History   Socioeconomic History  . Marital status: Widowed    Spouse name: Not on file  . Number of children: 1  . Years of education: Not on file  . Highest education level: Not on file  Occupational History  . Occupation: retired  Engineer, production  . Financial resource strain: Not on file  . Food insecurity:    Worry: Not on file    Inability: Not on file  . Transportation needs:    Medical: Not on file    Non-medical: Not on file  Tobacco Use  . Smoking status: Former Smoker    Packs/day: 0.10    Years: 2.00    Pack years: 0.20    Types: Cigarettes    Last attempt to  quit: 10/04/1991    Years since quitting: 27.3  . Smokeless tobacco: Never Used  . Tobacco comment: smoked on and off  Substance and Sexual Activity  . Alcohol use: No  . Drug use: No  . Sexual activity: Not on file  Lifestyle  . Physical activity:    Days per week: Not on file    Minutes per session: Not on file  . Stress: Not on file  Relationships  . Social connections:    Talks on phone: Not on file    Gets together: Not on file    Attends religious service: Not on file    Active member of club or organization: Not on file    Attends meetings of clubs or organizations: Not on file    Relationship status: Not on file  Other Topics Concern  . Not on file  Social History Narrative  . Not on file   Additional Social History:    Allergies:   Allergies  Allergen Reactions  . Azithromycin Anaphylaxis  . Mobic [Meloxicam] Swelling  . Ondansetron Hcl Other (See Comments) and Swelling    BLOOD CLOT;caused cardiac arrest BLOOD CLOT;caused cardiac arrest  . Penicillins Shortness Of Breath    Has patient had a PCN reaction causing immediate rash, facial/tongue/throat swelling, SOB or lightheadedness with hypotension: Yes Has patient had a PCN reaction causing severe rash involving mucus membranes or skin necrosis: No Has patient had a PCN reaction that required hospitalization: No Has patient had a PCN reaction occurring within the last 10 years: No If all of the above answers are "NO", then may proceed with Cephalosporin use.   Marland Kitchen Percocet [Oxycodone-Acetaminophen] Anaphylaxis  . Reclast [Zoledronic Acid] Swelling  . Vicodin [Hydrocodone-Acetaminophen] Anaphylaxis  . Codeine Other (See Comments)    Went into cardiac arrest at [facility] with this after surgery  . Ibandronic Acid   . Meloxicam   . Oxycodone Swelling    Swelling of tongue  . Zofran [Ondansetron]     BLOOD CLOT;caused cardiac arrest    Labs:  Results for orders placed or performed during the hospital encounter of  01/16/19 (from the past 48 hour(s))  Ethanol     Status: None   Collection Time: 01/16/19 10:32 PM  Result Value Ref Range   Alcohol, Ethyl (B) <10 <10 mg/dL    Comment: (NOTE) Lowest detectable limit for serum alcohol is 10 mg/dL. For medical purposes only. Performed at [facility], [address] [place], [place] [address]   CBC with Differential     Status: Abnormal   Collection Time: 01/16/19 10:32 PM  Result Value Ref Range   WBC 7.0 4.0 - 10.5 K/uL   RBC 3.64 (L) 3.87 - 5.11 MIL/uL   Hemoglobin 11.2 (L) 12.0 - 15.0 g/dL   HCT 60.4 (L) 54.0 - 98.1 %   MCV 93.4 80.0 - 100.0 fL   MCH 30.8 26.0 - 34.0 pg   MCHC 32.9 30.0 - 36.0 g/dL   RDW 19.1 47.8 - 29.5 %   Platelets 227 150 - 400 K/uL   nRBC 0.0 0.0 - 0.2 %   Neutrophils Relative % 55 %   Neutro Abs 3.9 1.7 - 7.7 K/uL   Lymphocytes Relative 23 %   Lymphs Abs 1.6 0.7 - 4.0 K/uL   Monocytes Relative 15 %   Monocytes Absolute 1.0 0.1 - 1.0 K/uL   Eosinophils Relative 6 %   Eosinophils Absolute 0.4 0.0 - 0.5 K/uL   Basophils Relative 1 %   Basophils Absolute 0.1 0.0 - 0.1 K/uL   Immature Granulocytes 0 %   Abs Immature Granulocytes 0.02 0.00 - 0.07 K/uL    Comment: Performed at [facility], [address] [place], [place] [address]  Acetaminophen level     Status: Abnormal   Collection Time: 01/16/19 10:32 PM  Result Value Ref Range   Acetaminophen (Tylenol), Serum <10 (L) 10 - 30 ug/mL    Comment: (NOTE) Therapeutic concentrations vary significantly. A range of 10-30 ug/mL  may be an effective concentration for many patients. However, some  are best treated at concentrations outside of this range. Acetaminophen concentrations >150 ug/mL at 4 hours after ingestion  and >50 ug/mL at 12 hours after ingestion are often associated with  toxic reactions. Performed at [facility], [phone]  [address] [place], [place] [address]   Salicylate level     Status: None   Collection Time: 01/16/19  10:32 PM  Result Value Ref Range   Salicylate Lvl <7.0 2.8 - 30.0 mg/dL    Comment: Performed at [facility], [address] [place], [place] [address]  Comprehensive metabolic panel     Status: Abnormal   Collection Time: 01/16/19 10:32 PM  Result Value Ref Range   Sodium 133 (L) 135 - 145 mmol/L   Potassium 3.5 3.5 - 5.1 mmol/L   Chloride 94 (L) 98 - 111 mmol/L   CO2 31 22 - 32 mmol/L   Glucose, Bld 109 (H) 70 - 99 mg/dL   BUN 8 8 - 23 mg/dL   Creatinine, Ser 0.98 0.44 - 1.00 mg/dL   Calcium 8.2 (L) 8.9 - 10.3 mg/dL   Total Protein 7.0 6.5 - 8.1 g/dL   Albumin 3.4 (L) 3.5 - 5.0 g/dL   AST 30 15 - 41 U/L   ALT 19 0 - 44 U/L   Alkaline Phosphatase 60 38 - 126 U/L   Total Bilirubin 0.3 0.3 - 1.2 mg/dL   GFR calc non Af Amer >60 >60 mL/min   GFR calc Af Amer >60 >60 mL/min   Anion gap 8 5 - 15    Comment: Performed at [facility], [address] [place], [place] [address]    No current facility-administered medications for this encounter.    Current Outpatient Medications  Medication Sig Dispense Refill  . aspirin 81 MG tablet Take 81 mg by mouth daily. Reported on 04/21/2016    . atorvastatin (LIPITOR) 40 MG tablet TAKE ONE TABLET BY MOUTH EVERY DAY 90 tablet 3  . Cholecalciferol (VITAMIN D3) 1000 units CAPS Take 6,000 Units by mouth daily.     Marland Kitchen denosumab (PROLIA) 60 MG/ML SOLN injection Inject 60 mg into the skin every 6 (six) months. Administer in upper arm, thigh, or abdomen    . divalproex (DEPAKOTE ER) 250 MG 24 hr tablet Take 1 tablet (250 mg total) by mouth every morning. 30 tablet 1  . donepezil (ARICEPT) 10 MG tablet Take 10 mg by mouth every morning.     Marland Kitchen ENSURE (ENSURE) Take 237 mLs by mouth 2 (two) times daily between meals.    . fluticasone (FLONASE) 50 MCG/ACT nasal spray Place 1 spray into both nostrils daily as needed for allergies.    Marland Kitchen levothyroxine (SYNTHROID, LEVOTHROID) 75 MCG tablet Take 75 mcg by mouth daily before breakfast.      . QUEtiapine (SEROQUEL) 25 MG tablet Take 1 tablet (25 mg total) by mouth 2 (two) times daily. In afternoon and at night 60 tablet 1  . venlafaxine XR (EFFEXOR-XR) 75 MG 24 hr capsule Take 1 capsule (75 mg total) by mouth daily with breakfast. 30 capsule 1    Musculoskeletal: Strength & Muscle Tone: decreased Gait & Station: Patient is lying in bed Patient leans: N/A  Psychiatric Specialty Exam: Physical Exam  Constitutional: She appears well-developed and well-nourished.  HENT:  Head: Normocephalic and atraumatic.  Respiratory: Effort normal.  Neurological: She is alert.    ROS  Blood pressure 128/72, pulse 79, temperature 98.1 F (36.7 C), temperature source Oral, resp. rate 16, height  (1.651 m), weight 45.4 kg, SpO2 100 %.Body mass index is 16.64 kg/m.  General Appearance: Casual  Eye Contact:  Fair  Speech:  Normal Rate  Volume:  Decreased  Mood:  Euthymic  Affect:  Congruent  Thought Process:  Goal Directed and Descriptions of Associations: Circumstantial  Orientation:  Other:  : Patient is oriented to 1, and does realize she is in a hospital.  Thought Content:  Negative  Suicidal Thoughts:  No  Homicidal Thoughts:  No  Memory:  Immediate;   Poor Recent;   Poor Remote;   Poor  Judgement:  Impaired  Insight:  Lacking  Psychomotor Activity:  Decreased  Concentration:  Concentration: Fair and Attention Span: Poor  Recall:  Poor  Fund of Knowledge:  Poor  Language:  Fair  Akathisia:  Negative  Handed:  Right  AIMS (if indicated):     Assets:  Desire for Improvement Resilience  ADL's:  Impaired  Cognition:  Impaired,  Severe  Sleep:        Treatment Plan Summary: Daily contact with patient to assess and evaluate symptoms and progress in treatment, Medication management and Plan : Patient is seen and examined.  Patient is a 78 year old female with a past psychiatric history significant for probable Alzheimer's dementia.  Unfortunately she has been in the  emergency department because of behavioral issues twice in the last 15 days.  She had been prescribed Seroquel on her last emergency room visit, and it is unclear whether or not the patient is compliant with any of these medications.  It is also apparent that the daughter is unable to care for her with her current behavioral issues.  We will attempt to have her admitted to a geriatric psychiatric facility.  The only other option would be nursing home placement at this time.  We will continue the medications as recommended on the previous psychiatric consultation in March, and monitor to see her response these medications and controlled environment.  Disposition: Recommend psychiatric Inpatient admission when medically cleared.  Antonieta PertGreg Lawson Zarek Relph, MD 01/17/2019 7:56 AM   Given the potential for awaiting placement I will go on and increase her venlaxine XR to 150 mg po q day in hope of decreasing her agitation and anxiety

## 2019-01-17 NOTE — ED Notes (Signed)
IVC/  PENDING  PLACEMENT 

## 2019-01-17 NOTE — ED Notes (Signed)
Hourly rounding reveals patient in room. No complaints, stable, in no acute distress. Q15 minute rounds and monitoring via Rover and Officer to continue.   

## 2019-01-17 NOTE — BH Assessment (Addendum)
Referral information for Geri-Psychiatric Hospitalization faxed to;   Marland Kitchen [facility] [phone]),     [facility] [phone] -or- [phone]) - wait list  . [facility] ([phone]),  . [facility] Lan [phone], [phone], [phone] or [phone]),   . [facility] [phone]   . [facility] [phone]),   . [facility] [phone]) - Denied due to dementia (Per [place])   . [facility] [phone])  . [facility] [phone]),   . [address]Franky Macho [phone]),   . [facility] [phone] or [phone])  . [place] [phone] or [phone]) - under review  . [facility] Daniels [phone]).   [employer] [phone])   [facility]   [facility]   [facility]

## 2019-01-17 NOTE — ED Notes (Signed)
Report to include Situation, Background, Assessment, and Recommendations received from [facility]. Patient alert and oriented, warm and dry, in no acute distress.Patient has advanced dementia. She is disoriented of place and time. Patient denies SI, HI, AVH and pain. Patient made aware of Q15 minute rounds and Psychologist, counselling presence for their safety. Patient instructed to come to me with needs or concerns.

## 2019-01-17 NOTE — ED Notes (Signed)
Patient has some bruising on bilateral lower leg

## 2019-01-17 NOTE — BH Assessment (Signed)
[facility] is reviewing pt for inpatient treatment; however, they are requesting the following:  - UDS - IVC - EKG - Chest X-Ray - H&P Note - BH Assessment Note

## 2019-01-17 NOTE — ED Notes (Signed)
While placing the EKG on patient, checked patient to see if she was dry, patient remains dry in diaper

## 2019-01-17 NOTE — ED Provider Notes (Signed)
-----------------------------------------   4:50 AM on 01/17/2019 -----------------------------------------   Blood pressure 128/72, pulse 79, temperature 98.1 F (36.7 C), temperature source Oral, resp. rate 16, height 5\' 5"  (1.651 m), weight 45.4 kg, SpO2 100 %.  The patient is calm and cooperative at this time.  There have been no acute events since the last update.  Awaiting disposition plan from Behavioral Medicine team.    Arnaldo Natal, MD 01/17/19 [phone]

## 2019-01-17 NOTE — ED Notes (Signed)
Hourly rounding reveals patient sleeping in room. No complaints, stable, in no acute distress. Q15 minute rounds and monitoring via Security to continue. 

## 2019-01-17 NOTE — BH Assessment (Addendum)
Assessment Note  Monica Downs is an 78 y.o. female. With a medical history as seen below has presented after being involuntarily committed by her daughter. Writer has conducted this assessment via tele-health.  Pt awakened to voice and was agreeable to complete assessment. Pt was difficult to understand much of the time and experienced select mutisum. Pt oriented to self only and unable to provide clear history.  clinician was unable to complete a thorough face to face assessment due to AMS. Due to pts present  mental status the following information was obtain by reviewing the pts charts, and consulting with collateral resources. Writer has spoken with the pts  Sister Liliahna Orendain @ [phone]. Who states that the patient rehabs recently for then it for similar behaviors. She shares that patient was diagnosed with dementia approximately 3 years ago and has been at her residence since that time. For her report the patient's memory has declined steadily for the past several weeks. She shared that the patient continues to new things such as [place] Drought home throughout the day. Jeannine Kitten for patient often becomes combative and her aggressive behavior is unmanageable. Patient has no previous inpatient psychiatric emissions although she does have a history of bipolar disorder. The patient's daughter states that she hates her medicine as she supposed to. She reports a history of brain damage following a head injury and share that the patient is unable to maintain restful sleep.    The history is limited by the condition of the patient   Diagnosis: Dementia   Past Medical History:  Past Medical History:  Diagnosis Date  . Allergic rhinitis   . Anxiety   . CAD (coronary artery disease)    a. 07/2007 Cath/PCI: LM nl, LAD 99p (3.0 x 18 Xience EES), D1 90ost, LCX nl, RCA nl;  b. 09/2014 MV: EF 72%, attenuation artifact, no ischemia.  . Clotting disorder (HCC)    LEGS;throat  . Falls   . History of  subarachnoid hemorrhage    a. 04/2016 In setting of fall-->traumatic brain injury.  Marland Kitchen HLD (hyperlipidemia)   . Hypertension   . Hypothyroidism   . Incontinence   . Left Forearm AV Fistula    a. Asymptomatic.  No blood draws/IV's;  b. 06/2013 confirmed by L FA duplex.  . Mitral regurgitation    a. 02/2011 Echo: EF 72%, trace TR, mild MR/AI; b. 01/2018 Echo: EF 60-65%, no rwma, Gr2 DD, mild AI/MR. Nl RV fxn. Mildly dil RV/RA. Mod TR. Nl PASP.  Marland Kitchen Obstructive sleep apnea   . Panic attacks   . Traumatic brain injury (HCC)    a. 04/2016 in setting of fall/SAH.    Past Surgical History:  Procedure Laterality Date  . BUNIONETTE EXCISION    . CARDIAC CATHETERIZATION  07/2007   99% mid LAD stenosis  . CORONARY STENT PLACEMENT  07/2007   Mid LAD: 3.0 X 18 mm Xience stent  . MULTIPLE TOOTH EXTRACTIONS    . THYROIDECTOMY    . TOTAL KNEE ARTHROPLASTY     left    Family History:  Family History  Problem Relation Age of Onset  . Heart disease Mother   . Hyperlipidemia Mother   . Tuberculosis Mother   . Colon cancer Father   . Colon cancer Unknown   . Lymphoma Unknown   . Heart disease Unknown   . Breast cancer Neg Hx     Social History:  reports that she quit smoking about 27 years ago. Her smoking use  included cigarettes. She has a 0.20 pack-year smoking history. She has never used smokeless tobacco. She reports that she does not drink alcohol or use drugs.  Additional Social History:  Alcohol / Drug Use Pain Medications: PTA Prescriptions: PTTA Over the Counter: PTA History of alcohol / drug use?: No history of alcohol / drug abuse  CIWA: CIWA-Ar BP: 128/72 Pulse Rate: 79 COWS:    Allergies:  Allergies  Allergen Reactions  . Azithromycin Anaphylaxis  . Mobic [Meloxicam] Swelling  . Ondansetron Hcl Other (See Comments) and Swelling    BLOOD CLOT;caused cardiac arrest BLOOD CLOT;caused cardiac arrest  . Penicillins Shortness Of Breath    Has patient had a PCN reaction  causing immediate rash, facial/tongue/throat swelling, SOB or lightheadedness with hypotension: Yes Has patient had a PCN reaction causing severe rash involving mucus membranes or skin necrosis: No Has patient had a PCN reaction that required hospitalization: No Has patient had a PCN reaction occurring within the last 10 years: No If all of the above answers are "NO", then may proceed with Cephalosporin use.   Marland Kitchen. Percocet [Oxycodone-Acetaminophen] Anaphylaxis  . Reclast [Zoledronic Acid] Swelling  . Vicodin [Hydrocodone-Acetaminophen] Anaphylaxis  . Codeine Other (See Comments)    [facility]  . Ibandronic Acid   . Meloxicam   . Oxycodone Swelling    Swelling of tongue  . Zofran [Ondansetron]     BLOOD CLOT;caused cardiac arrest    Home Medications: (Not in a hospital admission)   OB/GYN Status:  No LMP recorded. Patient is postmenopausal.  General Assessment Data Assessment unable to be completed: Yes Reason for not completing assessment: AMS [facility] Assessment: In system Is this a Tele or Face-to-Face Assessment?: Tele Assessment Is this an Initial Assessment or a Re-assessment for this encounter?: Initial Assessment Patient Accompanied by:: N/A Language Other than English: No Living Arrangements: Other (Comment)(WITH DAUGHTER ) What gender do you identify as?: Female Marital status: (UTA) Living Arrangements: Other (Comment), Children(WITH DAUGHTER ) Can pt return to current living arrangement?: Yes Admission Status: Involuntary Petitioner: Family member Is patient capable of signing voluntary admission?: No Referral Source: Self/Family/Friend Insurance type: Medicare   Medical Screening Exam [facility] ONLY) Medical Exam completed: Yes  Crisis Care Plan Living Arrangements: Other (Comment), Children(WITH DAUGHTER ) Legal Guardian: Other:(None ) Name of Psychiatrist: None Name of Therapist:  None  Education Status Is patient currently in school?: No Is the patient employed, unemployed or receiving disability?: Receiving disability income  Risk to self with the past 6 months Suicidal Ideation: (UTA) Has patient been a risk to self within the past 6 months prior to admission? : Other (comment)(UTA) Suicidal Intent: (UTA) Has patient had any suicidal intent within the past 6 months prior to admission? : (UTA) Is patient at risk for suicide?: (UTA) Suicidal Plan?: (UTA) Has patient had any suicidal plan within the past 6 months prior to admission? : (UTA) Access to Means: Rich Reining(UTA) Previous Attempts/Gestures: (UTA) How many times?: (UTA) Family Suicide History: No Persecutory voices/beliefs?: (UTA) Depression: (UTA) Depression Symptoms: Insomnia  Risk to Others within the past 6 months Homicidal Ideation: (UTA) Does patient have any lifetime risk of violence toward others beyond the six months prior to admission? : Unknown Thoughts of Harm to Others: (UTA) Current Homicidal Intent: (UTA) Current Homicidal Plan: (UTA) Access to Homicidal Means: (UTA) History of harm to others?: No Assessment of Violence: None Noted Does patient have access to weapons?: (  UTA) Criminal Charges Pending?: (UTA) Does patient have a court date: (UTA) Is patient on probation?: Unknown     Mental Status Report Appearance/Hygiene: In scrubs Eye Contact: Poor Motor Activity: Unable to assess Speech: Elective mutism Level of Consciousness: Unable to assess Mood: Other (Comment) Affect: Unable to Assess Thought Processes: Unable to Assess Judgement: Unable to Assess Orientation: Person, Not oriented Obsessive Compulsive Thoughts/Behaviors: Unable to Assess  Cognitive Functioning Concentration: Poor Memory: Recent Impaired, Remote Impaired Is patient IDD: No Insight: Unable to Assess Impulse Control: Unable to Assess Appetite: Poor  ADLScreening [facility] Assessment Services) Patient's  cognitive ability adequate to safely complete daily activities?: (UTA) Patient able to express need for assistance with ADLs?: (UTA) Independently performs ADLs?: (UTA)  Prior Inpatient Therapy Prior Inpatient Therapy: No  Prior Outpatient Therapy Prior Outpatient Therapy: No Does patient have an ACCT team?: No Does patient have Intensive In-House Services?  : No Does patient have [facility] services? : No Does patient have P4CC services?: No  ADL Screening (condition at time of admission) Patient's cognitive ability adequate to safely complete daily activities?: (UTA) Patient able to express need for assistance with ADLs?: (UTA) Independently performs ADLs?: (UTA)       Abuse/Neglect Assessment (Assessment to be complete while patient is alone) Abuse/Neglect Assessment Can Be Completed: Unable to assess, patient is non-responsive or altered mental status     Advance Directives (For Healthcare) Does Patient Have a Medical Advance Directive?: No Would patient like information on creating a medical advance directive?: No - Guardian declined          Disposition:  Disposition Initial Assessment Completed for this Encounter: Yes Patient referred to: Social Work  On Site Evaluation by:   Reviewed with Physician:    Asa Saunas 01/17/2019 2:36 AM

## 2019-01-17 NOTE — Progress Notes (Signed)
Pt staffed with psychiatry team, and per Dr. Susann Givens note pt is being recommended for geriatric psychiatric facility.   TOC/Social Work signing off.    Transition of Care [facility]) CM/SW Contact  Tania Santiana Glidden, LCSW Phone Number:  [phone] 01/17/2019, 9:13 AM

## 2019-01-18 DIAGNOSIS — F0391 Unspecified dementia with behavioral disturbance: Secondary | ICD-10-CM | POA: Diagnosis not present

## 2019-01-18 NOTE — ED Notes (Signed)
Hourly rounding reveals patient in room. No complaints, stable, in no acute distress. Q15 minute rounds and monitoring via Rover and Officer to continue.   

## 2019-01-18 NOTE — ED Notes (Signed)

## 2019-01-18 NOTE — BH Assessment (Signed)
Patient has been accepted to [facility].  Patient assigned to room Geratic Unit Accepting physician is Dr. Seth Bake.  Call report to [phone].  Representative was [employer].   ER Staff is aware of it:  Anette, ER Secretary  Amy T., Patient's Nurse     Patient's daughter ([name]) have been updated as well.

## 2019-01-18 NOTE — Consult Note (Signed)
[facility] Face-to-Face Psychiatry Consult   Reason for Consult:  Agitation  Referring Physician:  EDP Patient Identification: Monica Downs MRN:  [id number] Principal Diagnosis: Unspecified dementia with behavioral disturbance (HCC) Diagnosis:  Principal Problem:   Unspecified dementia with behavioral disturbance (HCC)  Total Time spent with patient: 30 minutes  Subjective:   Monica Downs is a 78 y.o. female patient admitted with behavioral disturbances related to her dementia.  HPI:  78 yo female who presented to the ED via her daughter after being agitated and combative.  She can no longer manger her at home and feels she needs placement into a facility after her behaviors stabilize.  Psychiatrist yesterday recommended inpatient geriatric psychiatric admission.  [facility] accepted her today, daughter Monica Downs) notified and agreeable.  Pleasant, calm and cooperative on assessment this morning.  Engaged easily in conversation with no drowsiness noted.  Stable for transfer.  Per admission note via psychiatrist, Dr Jola Babinski on 01/17/19:   Patient is seen and examined.  Patient is a 78 year old female with a past psychiatric history significant for dementia.  Review of the electronic medical record revealed her last neurology appointment was on 11/02/2018.  She has been diagnosed with probable Alzheimer's disease.  She is followed at the [facility] clinic.  She presented to the [facility] emergency department on 01/17/2019 after being placed under involuntary commitment by her daughter.  The patient is significantly demented, and discussed with me that she her daughter was telling her what to do, and she was unable to cope with that very well.  Unfortunately she is oriented only to self and that this is a hospital.  Review of the electronic medical record revealed that the earlier behavioral health assessment included speaking to the daughter.  They stated that the patient  had been diagnosed with dementia approximately 3 years ago.  She had been at the sister's residence for the last 3 years.  Unfortunately her memory has continued to decline especially over the last several weeks.  It was also reported that the patient often becomes combative and that her aggressive behavior is unmanageable.  At least according to the electronic medical record and care everywhere it does not appear as though she has been admitted to a psychiatric facility in the past.  She did have an emergency room visit on 01/01/2019.  She was seen by psychiatry at that time.  Seroquel was added to her current regimen which included Depakote and venlafaxine.  The patient herself is not a great historian, and is really unable to provide additional history.  It was felt the patient did not meet criteria for psychiatric inpatient admission.  Again, Seroquel 25 mg p.o. twice in the afternoon and at bedtime to decrease her sundowning and agitation.  This morning she is sleeping and easily aroused.  She was not displaying any aggressive behavior at this time.  Review of her laboratories revealed a mild anemia, Depakote level of 35, and the rest of her laboratories were essentially normal.  Urinalysis was not available at time of examination.  Past Psychiatric History: dementia  Risk to Self: Suicidal Ideation: (UTA) Suicidal Intent: (UTA) Is patient at risk for suicide?: (UTA) Suicidal Plan?: (UTA) Access to Means: (UTA) How many times?: (UTA) Risk to Others: Homicidal Ideation: (UTA) Thoughts of Harm to Others: (UTA) Current Homicidal Intent: (UTA) Current Homicidal Plan: (UTA) Access to Homicidal Means: (UTA) History of harm to others?: No Assessment of Violence: None Noted Does patient have access to  weapons?: (UTA) Criminal Charges Pending?: (UTA) Does patient have a court date: (UTA) Prior Inpatient Therapy: Prior Inpatient Therapy: No Prior Outpatient Therapy: Prior Outpatient Therapy: No Does  patient have an ACCT team?: No Does patient have Intensive In-House Services?  : No Does patient have [facility] services? : No Does patient have P4CC services?: No  Past Medical History:  Past Medical History:  Diagnosis Date  . Allergic rhinitis   . Anxiety   . CAD (coronary artery disease)    a. 07/2007 Cath/PCI: LM nl, LAD 99p (3.0 x 18 Xience EES), D1 90ost, LCX nl, RCA nl;  b. 09/2014 MV: EF 72%, attenuation artifact, no ischemia.  . Clotting disorder (HCC)    LEGS;throat  . Falls   . History of subarachnoid hemorrhage    a. 04/2016 In setting of fall-->traumatic brain injury.  Marland Kitchen HLD (hyperlipidemia)   . Hypertension   . Hypothyroidism   . Incontinence   . Left Forearm AV Fistula    a. Asymptomatic.  No blood draws/IV's;  b. 06/2013 confirmed by L FA duplex.  . Mitral regurgitation    a. 02/2011 Echo: EF 72%, trace TR, mild MR/AI; b. 01/2018 Echo: EF 60-65%, no rwma, Gr2 DD, mild AI/MR. Nl RV fxn. Mildly dil RV/RA. Mod TR. Nl PASP.  Marland Kitchen Obstructive sleep apnea   . Panic attacks   . Traumatic brain injury (HCC)    a. 04/2016 in setting of fall/SAH.    Past Surgical History:  Procedure Laterality Date  . BUNIONETTE EXCISION    . CARDIAC CATHETERIZATION  07/2007   99% mid LAD stenosis  . CORONARY STENT PLACEMENT  07/2007   Mid LAD: 3.0 X 18 mm Xience stent  . MULTIPLE TOOTH EXTRACTIONS    . THYROIDECTOMY    . TOTAL KNEE ARTHROPLASTY     left   Family History:  Family History  Problem Relation Age of Onset  . Heart disease Mother   . Hyperlipidemia Mother   . Tuberculosis Mother   . Colon cancer Father   . Colon cancer Unknown   . Lymphoma Unknown   . Heart disease Unknown   . Breast cancer Neg Hx    Family Psychiatric  History: none Social History:  Social History   Substance and Sexual Activity  Alcohol Use No     Social History   Substance and Sexual Activity  Drug Use No    Social History   Socioeconomic History  . Marital status: Widowed    Spouse  name: Not on file  . Number of children: 1  . Years of education: Not on file  . Highest education level: Not on file  Occupational History  . Occupation: retired  Engineer, production  . Financial resource strain: Not on file  . Food insecurity:    Worry: Not on file    Inability: Not on file  . Transportation needs:    Medical: Not on file    Non-medical: Not on file  Tobacco Use  . Smoking status: Former Smoker    Packs/day: 0.10    Years: 2.00    Pack years: 0.20    Types: Cigarettes    Last attempt to quit: 10/04/1991    Years since quitting: 27.3  . Smokeless tobacco: Never Used  . Tobacco comment: smoked on and off  Substance and Sexual Activity  . Alcohol use: No  . Drug use: No  . Sexual activity: Not on file  Lifestyle  . Physical activity:  Days per week: Not on file    Minutes per session: Not on file  . Stress: Not on file  Relationships  . Social connections:    Talks on phone: Not on file    Gets together: Not on file    Attends religious service: Not on file    Active member of club or organization: Not on file    Attends meetings of clubs or organizations: Not on file    Relationship status: Not on file  Other Topics Concern  . Not on file  Social History Narrative  . Not on file   Additional Social History:    Allergies:   Allergies  Allergen Reactions  . Azithromycin Anaphylaxis  . Mobic [Meloxicam] Swelling  . Ondansetron Hcl Other (See Comments) and Swelling    BLOOD CLOT;caused cardiac arrest BLOOD CLOT;caused cardiac arrest  . Penicillins Shortness Of Breath    Has patient had a PCN reaction causing immediate rash, facial/tongue/throat swelling, SOB or lightheadedness with hypotension: Yes Has patient had a PCN reaction causing severe rash involving mucus membranes or skin necrosis: No Has patient had a PCN reaction that required hospitalization: No Has patient had a PCN reaction occurring within the last 10 years: No If all of the above  answers are "NO", then may proceed with Cephalosporin use.   Marland Kitchen Percocet [Oxycodone-Acetaminophen] Anaphylaxis  . Reclast [Zoledronic Acid] Swelling  . Vicodin [Hydrocodone-Acetaminophen] Anaphylaxis  . Codeine Other (See Comments)    Went into cardiac arrest at [facility] with this after surgery  . Ibandronic Acid   . Oxycodone Swelling    Swelling of tongue    Labs:  Results for orders placed or performed during the hospital encounter of 01/16/19 (from the past 48 hour(s))  Urinalysis, Complete w Microscopic     Status: Abnormal   Collection Time: 01/16/19  3:51 PM  Result Value Ref Range   Color, Urine YELLOW (A) YELLOW   APPearance HAZY (A) CLEAR   Specific Gravity, Urine 1.009 1.005 - 1.030   pH 7.0 5.0 - 8.0   Glucose, UA NEGATIVE NEGATIVE mg/dL   Hgb urine dipstick NEGATIVE NEGATIVE   Bilirubin Urine NEGATIVE NEGATIVE   Ketones, ur NEGATIVE NEGATIVE mg/dL   Protein, ur NEGATIVE NEGATIVE mg/dL   Nitrite NEGATIVE NEGATIVE   Leukocytes,Ua NEGATIVE NEGATIVE   RBC / HPF 0-5 0 - 5 RBC/hpf   WBC, UA 0-5 0 - 5 WBC/hpf   Bacteria, UA NONE SEEN NONE SEEN   Squamous Epithelial / LPF NONE SEEN 0 - 5   Mucus PRESENT     Comment: Performed at [facility], [address] [place], [place] [address]  Ethanol     Status: None   Collection Time: 01/16/19 10:32 PM  Result Value Ref Range   Alcohol, Ethyl (B) <10 <10 mg/dL    Comment: (NOTE) Lowest detectable limit for serum alcohol is 10 mg/dL. For medical purposes only. Performed at [facility], [address] [place], [place] [address]   CBC with Differential     Status: Abnormal   Collection Time: 01/16/19 10:32 PM  Result Value Ref Range   WBC 7.0 4.0 - 10.5 K/uL   RBC 3.64 (L) 3.87 - 5.11 MIL/uL   Hemoglobin 11.2 (L) 12.0 - 15.0 g/dL   HCT 62.6 (L) 94.8 - 54.6 %   MCV 93.4 80.0 - 100.0 fL   MCH 30.8 26.0 - 34.0 pg   MCHC 32.9 30.0 - 36.0 g/dL   RDW 12.6  11.5 - 15.5 %   Platelets 227 150 - 400 K/uL    nRBC 0.0 0.0 - 0.2 %   Neutrophils Relative % 55 %   Neutro Abs 3.9 1.7 - 7.7 K/uL   Lymphocytes Relative 23 %   Lymphs Abs 1.6 0.7 - 4.0 K/uL   Monocytes Relative 15 %   Monocytes Absolute 1.0 0.1 - 1.0 K/uL   Eosinophils Relative 6 %   Eosinophils Absolute 0.4 0.0 - 0.5 K/uL   Basophils Relative 1 %   Basophils Absolute 0.1 0.0 - 0.1 K/uL   Immature Granulocytes 0 %   Abs Immature Granulocytes 0.02 0.00 - 0.07 K/uL    Comment: Performed at [facility], [address] [place], [place] [address]  Acetaminophen level     Status: Abnormal   Collection Time: 01/16/19 10:32 PM  Result Value Ref Range   Acetaminophen (Tylenol), Serum <10 (L) 10 - 30 ug/mL    Comment: (NOTE) Therapeutic concentrations vary significantly. A range of 10-30 ug/mL  may be an effective concentration for many patients. However, some  are best treated at concentrations outside of this range. Acetaminophen concentrations >150 ug/mL at 4 hours after ingestion  and >50 ug/mL at 12 hours after ingestion are often associated with  toxic reactions. Performed at [facility], [address] [place], [place] [address]   Salicylate level     Status: None   Collection Time: 01/16/19 10:32 PM  Result Value Ref Range   Salicylate Lvl <7.0 2.8 - 30.0 mg/dL    Comment: Performed at [facility], [address] [place], [place] [address]  Comprehensive metabolic panel     Status: Abnormal   Collection Time: 01/16/19 10:32 PM  Result Value Ref Range   Sodium 133 (L) 135 - 145 mmol/L   Potassium 3.5 3.5 - 5.1 mmol/L   Chloride 94 (L) 98 - 111 mmol/L   CO2 31 22 - 32 mmol/L   Glucose, Bld 109 (H) 70 - 99 mg/dL   BUN 8 8 - 23 mg/dL   Creatinine, Ser 9.14 0.44 - 1.00 mg/dL   Calcium 8.2 (L) 8.9 - 10.3 mg/dL   Total Protein 7.0 6.5 - 8.1 g/dL   Albumin 3.4 (L) 3.5 - 5.0 g/dL   AST 30 15 - 41 U/L   ALT 19 0 - 44 U/L   Alkaline Phosphatase 60 38 - 126 U/L   Total Bilirubin 0.3 0.3 - 1.2  mg/dL   GFR calc non Af Amer >60 >60 mL/min   GFR calc Af Amer >60 >60 mL/min   Anion gap 8 5 - 15    Comment: Performed at [facility], [address], [place], [place] [address]  Urine Drug Screen, Qualitative ([facility] only)     Status: None   Collection Time: 01/17/19  3:51 PM  Result Value Ref Range   Tricyclic, Ur Screen NONE DETECTED NONE DETECTED   Amphetamines, Ur Screen NONE DETECTED NONE DETECTED   MDMA (Ecstasy)Ur Screen NONE DETECTED NONE DETECTED   Cocaine Metabolite,Ur [place] NONE DETECTED NONE DETECTED   Opiate, Ur Screen NONE DETECTED NONE DETECTED   Phencyclidine (PCP) Ur S NONE DETECTED NONE DETECTED   Cannabinoid 50 Ng, Ur [place] NONE DETECTED NONE DETECTED   Barbiturates, Ur Screen NONE DETECTED NONE DETECTED   Benzodiazepine, Ur Scrn NONE DETECTED NONE DETECTED   Methadone Scn, Ur NONE DETECTED NONE DETECTED    Comment: (NOTE) Tricyclics + metabolites, urine    Cutoff  1000 ng/mL Amphetamines + metabolites, urine  Cutoff 1000 ng/mL MDMA (Ecstasy), urine              Cutoff 500 ng/mL Cocaine Metabolite, urine          Cutoff 300 ng/mL Opiate + metabolites, urine        Cutoff 300 ng/mL Phencyclidine (PCP), urine         Cutoff 25 ng/mL Cannabinoid, urine                 Cutoff 50 ng/mL Barbiturates + metabolites, urine  Cutoff 200 ng/mL Benzodiazepine, urine              Cutoff 200 ng/mL Methadone, urine                   Cutoff 300 ng/mL The urine drug screen provides only a preliminary, unconfirmed analytical test result and should not be used for non-medical purposes. Clinical consideration and professional judgment should be applied to any positive drug screen result due to possible interfering substances. A more specific alternate chemical method must be used in order to obtain a confirmed analytical result. Gas chromatography / mass spectrometry (GC/MS) is the preferred confirmat ory method. Performed at [facility], [address]  [place], [place] [address]     Current Facility-Administered Medications  Medication Dose Route Frequency Provider Last Rate Last Dose  . divalproex (DEPAKOTE ER) 24 hr tablet 250 mg  250 mg Oral Daily Antonieta Pertlary, Greg Lawson, MD   250 mg at 01/18/19 0813  . donepezil (ARICEPT) tablet 10 mg  10 mg Oral QHS Antonieta Pertlary, Greg Lawson, MD   10 mg at 01/17/19 2119  . QUEtiapine (SEROQUEL) tablet 25 mg  25 mg Oral BID Antonieta Pertlary, Greg Lawson, MD   25 mg at 01/18/19 0813  . venlafaxine XR (EFFEXOR-XR) 24 hr capsule 150 mg  150 mg Oral Q breakfast Antonieta Pertlary, Greg Lawson, MD   150 mg at 01/18/19 [id number]   Current Outpatient Medications  Medication Sig Dispense Refill  . aspirin 81 MG tablet Take 81 mg by mouth daily.     Marland Kitchen. atorvastatin (LIPITOR) 40 MG tablet TAKE ONE TABLET BY MOUTH EVERY DAY (Patient taking differently: Take 40 mg by mouth daily. ) 90 tablet 3  . Azelastine HCl 137 MCG/SPRAY SOLN Place 1 spray into both nostrils 2 (two) times daily.    . Cholecalciferol (VITAMIN D3) 1000 units CAPS Take 6,000 Units by mouth daily.     Marland Kitchen. denosumab (PROLIA) 60 MG/ML SOLN injection Inject 60 mg into the skin every 6 (six) months. Administer in upper arm, thigh, or abdomen    . divalproex (DEPAKOTE ER) 250 MG 24 hr tablet Take 1 tablet (250 mg total) by mouth every morning. 30 tablet 1  . donepezil (ARICEPT) 10 MG tablet Take 10 mg by mouth every morning.     Marland Kitchen. ENSURE (ENSURE) Take 237 mLs by mouth 2 (two) times daily between meals.    . ferrous sulfate 325 (65 FE) MG tablet Take 325 mg by mouth daily with breakfast.    . levothyroxine (SYNTHROID, LEVOTHROID) 75 MCG tablet Take 75 mcg by mouth daily before breakfast.     . QUEtiapine (SEROQUEL) 25 MG tablet Take 1 tablet (25 mg total) by mouth 2 (two) times daily. In afternoon and at night (Patient taking differently: Take 50 mg by mouth at bedtime. ) 60 tablet 1  . venlafaxine (EFFEXOR) 75 MG tablet Take 75 mg by mouth 2 (  two) times daily.    Marland Kitchen venlafaxine XR (EFFEXOR-XR) 75 MG  24 hr capsule Take 1 capsule (75 mg total) by mouth daily with breakfast. 30 capsule 1    Musculoskeletal: Strength & Muscle Tone: within normal limits Gait & Station: normal Patient leans: N/A  Psychiatric Specialty Exam: Physical Exam  Nursing note and vitals reviewed. Constitutional: She appears well-developed.  HENT:  Head: Normocephalic.  Neck: Normal range of motion.  Respiratory: Effort normal.  Musculoskeletal: Normal range of motion.  Neurological: She is alert.  Psychiatric: Her speech is normal and behavior is normal. Thought content normal. Her mood appears anxious. Her affect is labile. Cognition and memory are impaired. She expresses impulsivity.    Review of Systems  Psychiatric/Behavioral: Positive for memory loss. The patient is nervous/anxious.   All other systems reviewed and are negative.   Blood pressure 122/77, pulse 82, temperature 98.1 F (36.7 C), temperature source Oral, resp. rate 17, height  (1.651 m), weight 45.4 kg, SpO2 98 %.Body mass index is 16.64 kg/m.  General Appearance: Casual  Eye Contact:  Good  Speech:  Normal Rate  Volume:  Normal  Mood:  Anxious, mild  Affect:  Blunt  Thought Process:  Coherent and Descriptions of Associations: Intact  Orientation:  Other:  person  Thought Content:  Logical  Suicidal Thoughts:  No  Homicidal Thoughts:  No  Memory:  Immediate;   Fair Recent;   Poor Remote;   Poor  Judgement:  Impaired  Insight:  Lacking  Psychomotor Activity:  Normal  Concentration:  Concentration: Fair and Attention Span: Fair  Recall:  Poor  Fund of Knowledge:  Fair  Language:  Fair  Akathisia:  No  Handed:  Right  AIMS (if indicated):     Assets:  Leisure Time Resilience Social Support  ADL's:  Impaired  Cognition:  Impaired,  Moderate  Sleep:        Treatment Plan Summary: Unspecified dementia with behavioral disturbances: -Continued Depakote 250 mg daily -Continued Seroquel 25 mg BId -Continued Effexor  150 mg daily -Continued Aricept 10 mg daily. -Ad[facility] to [facility]  Disposition: Recommend psychiatric Inpatient admission when medically cleared.  Nanine Means, NP 01/18/2019 2:33 PM

## 2019-01-18 NOTE — ED Notes (Signed)
ED  Is the patient under IVC or is there intent for IVC: Yes.   Is the patient medically cleared: Yes.   Is there vacancy in the ED BHU: Yes.   Is the population mix appropriate for patient: Yes.   Is the patient awaiting placement in inpatient or outpatient setting: Yes.  geriatric psychiatric inpatient hospitalization pending   Has the patient had a psychiatric consult: Yes.   Survey of unit performed for contraband, proper placement and condition of furniture, tampering with fixtures in bathroom, shower, and each patient room: Yes.  ; Findings:  APPEARANCE/BEHAVIOR Calm and cooperative NEURO ASSESSMENT Orientation: oriented to self Denies pain Hallucinations: No.None noted (Hallucinations)  denies Speech: Normal Gait: normal RESPIRATORY ASSESSMENT Even  Unlabored respirations  CARDIOVASCULAR ASSESSMENT Pulses equal   regular rate  Skin warm and dry   GASTROINTESTINAL ASSESSMENT no GI complaint EXTREMITIES Full ROM  PLAN OF CARE Provide calm/safe environment. Vital signs assessed twice daily. ED BHU Assessment once each 12-hour shift. Collaborate with TTS daily or as condition indicates. Assure the ED provider has rounded once each shift. Provide and encourage hygiene. Provide redirection as needed. Assess for escalating behavior; address immediately and inform ED provider.  Assess family dynamic and appropriateness for visitation as needed: Yes.  ; If necessary, describe findings:  Educate the patient/family about BHU procedures/visitation: Yes.  ; If necessary, describe findings:

## 2019-01-18 NOTE — ED Notes (Signed)
She is currently in the BR  She ambulates with a steady gait - no verbalized needs or concerns at this time

## 2019-01-18 NOTE — ED Notes (Signed)
Called back to [place] and spoke with Efraim Kaufmann  - informed her that the pt is transferring now

## 2019-01-18 NOTE — ED Notes (Signed)
EMATLA reviewed

## 2019-01-18 NOTE — ED Provider Notes (Signed)
-----------------------------------------   7:51 AM on 01/18/2019 -----------------------------------------   Blood pressure (!) 112/59, pulse 81, temperature 98.3 F (36.8 C), temperature source Oral, resp. rate 17, height 5\' 5"  (1.651 m), weight 45.4 kg, SpO2 95 %.  The patient is calm and cooperative at this time.  There have been no acute events since the last update.  Awaiting disposition plan from Behavioral Medicine team.    Arnaldo Natal, MD 01/18/19 [phone]

## 2019-01-18 NOTE — BH Assessment (Signed)
08:40-Writer received phone call from patient's daughter [phone]). Writer updated her about patient's disposition and was looking for Psych Inpatient bed. Daughter was asking about long-term placement and writer informed her, that was not the recommendation at this time and didn't have any information to give her about it. Writer reiterate the goal was to admit the patient to a geriatric psych hospital for stabilization. Daughter then shared, if the patient is discharged she was unable to return to her home because the patient was violent. Writer informed her, if the patient remains in the ER and improve and no no longer meet inpatient criteria, she will be discharge and he is unaware of what that plan would be but at that time because the current recommendation is for geriatric psych for stabilization .   12:14-Patient's daughter called requesting the phone number for [facility]. Writer told her he would get the direct number for the unit she is transferring to and call her back.    13:04-Writer spoke with [facility] to get the unit's number. Writer called the daughter back and provided her with the number [phone]).

## 2019-01-18 NOTE — ED Notes (Signed)
She is currently talking on the phone with her daughter - I provided her daughter with an update   BEHAVIORAL HEALTH ROUNDING Patient sleeping: No. Patient alert : yes Behavior appropriate: Yes.  ; If no, describe:  Nutrition and fluids offered: yes Toileting and hygiene offered: Yes  Sitter present: q15 minute observations and security monitoring Law enforcement present: Yes  ODS

## 2019-01-18 NOTE — ED Notes (Signed)
BEHAVIORAL HEALTH ROUNDING Patient sleeping: No. Patient alert : yes Behavior appropriate: Yes.  ; If no, describe:  Nutrition and fluids offered: yes Toileting and hygiene offered: Yes  Sitter present: q15 minute observations and security monitoring Law enforcement present: Yes  ODS  

## 2019-01-18 NOTE — ED Notes (Signed)
Assistance provided with a clean undergarment  - linens changed

## 2019-01-18 NOTE — ED Notes (Signed)
Patient observed lying in bed with eyes closed  Even, unlabored respirations observed   NAD assessed   I will continue to monitor along with every 15 minute visual observations and ongoing security monitoring

## 2019-01-25 ENCOUNTER — Ambulatory Visit: Payer: Medicare Other | Admitting: Pulmonary Disease

## 2019-02-13 NOTE — ED Provider Notes (Signed)
[facility] Emergency Department Provider Note   ____________________________________________   First MD Initiated Contact with Patient 01/16/19 2142     (approximate)  I have reviewed the triage vital signs and the nursing notes.   HISTORY  Chief Complaint Medical Clearance History limited by apparent dementia   HPI Monica Downs is a 78 y.o. female patient arrived under IVC.  Apparently patient tried to leave the house and her daughter take took out papers on her.  Psychiatry had seen the patient feels the patient has dementia and is not in need of psychiatric care but possibly memory unit care.  Patient had no complaints.        Past Medical History:  Diagnosis Date  . Allergic rhinitis   . Anxiety   . CAD (coronary artery disease)    a. 07/2007 Cath/PCI: LM nl, LAD 99p (3.0 x 18 Xience EES), D1 90ost, LCX nl, RCA nl;  b. 09/2014 MV: EF 72%, attenuation artifact, no ischemia.  . Clotting disorder (HCC)    LEGS;throat  . Falls   . History of subarachnoid hemorrhage    a. 04/2016 In setting of fall-->traumatic brain injury.  Marland Kitchen HLD (hyperlipidemia)   . Hypertension   . Hypothyroidism   . Incontinence   . Left Forearm AV Fistula    a. Asymptomatic.  No blood draws/IV's;  b. 06/2013 confirmed by L FA duplex.  . Mitral regurgitation    a. 02/2011 Echo: EF 72%, trace TR, mild MR/AI; b. 01/2018 Echo: EF 60-65%, no rwma, Gr2 DD, mild AI/MR. Nl RV fxn. Mildly dil RV/RA. Mod TR. Nl PASP.  Marland Kitchen Obstructive sleep apnea   . Panic attacks   . Traumatic brain injury (HCC)    a. 04/2016 in setting of fall/SAH.    Patient Active Problem List   Diagnosis Date Noted  . Unspecified dementia with behavioral disturbance (HCC) 01/02/2019  . Traumatic subarachnoid hemorrhage (HCC) 04/11/2016  . 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 CAD (coronary artery disease)   . HLD (hyperlipidemia)     Past Surgical History:   Procedure Laterality Date  . BUNIONETTE EXCISION    . CARDIAC CATHETERIZATION  07/2007   99% mid LAD stenosis  . CORONARY STENT PLACEMENT  07/2007   Mid LAD: 3.0 X 18 mm Xience stent  . MULTIPLE TOOTH EXTRACTIONS    . THYROIDECTOMY    . TOTAL KNEE ARTHROPLASTY     left    Prior to Admission medications   Medication Sig Start Date End Date Taking? Authorizing Provider  aspirin 81 MG tablet Take 81 mg by mouth daily.    Yes [provider]  atorvastatin (LIPITOR) 40 MG tablet TAKE ONE TABLET BY MOUTH EVERY DAY Patient taking differently: Take 40 mg by mouth daily.  12/14/18  Yes Iran Ouch, MD  Azelastine HCl 137 MCG/SPRAY SOLN Place 1 spray into both nostrils 2 (two) times daily.   Yes [provider]  Cholecalciferol (VITAMIN D3) 1000 units CAPS Take 6,000 Units by mouth daily.    Yes [provider]  denosumab (PROLIA) 60 MG/ML SOLN injection Inject 60 mg into the skin every 6 (six) months. Administer in upper arm, thigh, or abdomen   Yes [provider]  divalproex (DEPAKOTE ER) 250 MG 24 hr tablet Take 1 tablet (250 mg total) by mouth every morning. 01/03/19  Yes Mariel Craft, MD  donepezil (ARICEPT) 10 MG tablet Take 10 mg  by mouth every morning.  09/02/16 01/17/19 Yes [provider]  ENSURE (ENSURE) Take 237 mLs by mouth 2 (two) times daily between meals.   Yes [provider]  ferrous sulfate 325 (65 FE) MG tablet Take 325 mg by mouth daily with breakfast.   Yes [provider]  levothyroxine (SYNTHROID, LEVOTHROID) 75 MCG tablet Take 75 mcg by mouth daily before breakfast.  01/08/16  Yes [provider]  QUEtiapine (SEROQUEL) 25 MG tablet Take 1 tablet (25 mg total) by mouth 2 (two) times daily. In afternoon and at night Patient taking differently: Take 50 mg by mouth at bedtime.  01/03/19  Yes Mariel CraftMaurer, Sheila M, MD  venlafaxine [facility](EFFEXOR) 75 MG tablet Take 75 mg by mouth 2 (two) times daily.   Yes [provider]  venlafaxine XR (EFFEXOR-XR) 75 MG 24 hr capsule Take 1 capsule (75 mg total) by mouth daily with breakfast. 01/04/19   Mariel CraftMaurer, Sheila M, MD    Allergies Azithromycin; Mobic [meloxicam]; Ondansetron hcl; Penicillins; Percocet [oxycodone-acetaminophen]; Reclast [zoledronic acid]; Vicodin [hydrocodone-acetaminophen]; Codeine; Ibandronic acid; and Oxycodone  Family History  Problem Relation Age of Onset  . Heart disease Mother   . Hyperlipidemia Mother   . Tuberculosis Mother   . Colon cancer Father   . Colon cancer Unknown   . Lymphoma Unknown   . Heart disease Unknown   . Breast cancer Neg Hx     Social History Social History   Tobacco Use  . Smoking status: Former Smoker    Packs/day: 0.10    Years: 2.00    Pack years: 0.20    Types: Cigarettes    Last attempt to quit: 10/04/1991    Years since quitting: 27.3  . Smokeless tobacco: Never Used  . Tobacco comment: smoked on and off  Substance Use Topics  . Alcohol use: No  . Drug use: No    Review of Systems Not obtainable  ____________________________________________   PHYSICAL EXAM:  VITAL SIGNS: ED Triage Vitals  Enc Vitals Group     BP 01/16/19 2203 128/72     Pulse Rate 01/16/19 2203 79     Resp 01/16/19 2203 16     Temp 01/16/19 2203 98.1 F (36.7 C)     Temp Source 01/16/19 2203 Oral     SpO2 01/16/19 2203 100 %     Weight 01/16/19 2202 100 lb (45.4 kg)     Height 01/16/19 2202 5\' 5"  (1.651 m)     Head Circumference --      Peak Flow --      Pain Score 01/16/19 2202 0     Pain Loc --      Pain Edu? --      Excl. in GC? --     Constitutional: Alert . Well appearing and in no acute distress. Eyes: Conjunctivae are normal.  Head: Atraumatic. Nose: No congestion/rhinnorhea. Mouth/Throat: Mucous membranes are moist.  Oropharynx non-erythematous. Neck: No stridor.   Cardiovascular: Normal rate, regular rhythm. Grossly normal heart sounds.   Respiratory: Normal respiratory effort.  No  retractions. Lungs CTAB. Gastrointestinal: Soft and nontender. No distention. No abdominal bruits Musculoskeletal: No lower extremity tenderness Neurologic:  Normal speech and language. No gross focal neurologic deficits are appreciated.  Skin:  Skin is warm, dry and intact. No rash noted.   ____________________________________________   LABS (all labs ordered are listed, but only abnormal results are displayed)  Labs Reviewed  URINALYSIS, COMPLETE (UACMP) WITH MICROSCOPIC - Abnormal; Notable for the  following components:      Result Value   Color, Urine YELLOW (*)    APPearance HAZY (*)    All other components within normal limits  CBC WITH DIFFERENTIAL/PLATELET - Abnormal; Notable for the following components:   RBC 3.64 (*)    Hemoglobin 11.2 (*)    HCT 34.0 (*)    All other components within normal limits  ACETAMINOPHEN LEVEL - Abnormal; Notable for the following components:   Acetaminophen (Tylenol), Serum <10 (*)    All other components within normal limits  COMPREHENSIVE METABOLIC PANEL - Abnormal; Notable for the following components:   Sodium 133 (*)    Chloride 94 (*)    Glucose, Bld 109 (*)    Calcium 8.2 (*)    Albumin 3.4 (*)    All other components within normal limits  ETHANOL  SALICYLATE LEVEL  URINE DRUG SCREEN, QUALITATIVE ([facility] ONLY)   ____________________________________________  EKG  KG read interpreted by me shows normal sinus rhythm rate of 78 axis no acute ST-T changes ____________________________________________  RADIOLOGY  ED MD interpretatio x-ray read by radiology is negative shows only COPD  Official radiology report(s): No results found.  ____________________________________________   PROCEDURES  Procedure(s) performed (including Critical Care):  Procedures   ____________________________________________   INITIAL IMPRESSION / ASSESSMENT AND PLAN / ED COURSE             ____________________________________________    FINAL CLINICAL IMPRESSION(S) / ED DIAGNOSES  Final diagnoses:  Dementia with behavioral disturbance, unspecified dementia type [facility])     ED Discharge Orders    None       Note:  This document was prepared using Dragon voice recognition software and may include unintentional dictation errors.    Arnaldo Natal, MD 02/13/19 [phone]

## 2019-02-14 ENCOUNTER — Telehealth: Payer: Self-pay | Admitting: Pulmonary Disease

## 2019-02-14 NOTE — Telephone Encounter (Signed)
Called and spoke to pt's daughter, Monica Downs(DPR). Monica Downs is requesting that a letter be sent to [facility] stating that pt wears a cpap machine.  Letter has been faxed to [facility] at [phone]. Monica Downs also stated that she would call back to schedule pt for a office visit, once pt starts wearing her machine again. Nothing further is needed.

## 2019-02-22 ENCOUNTER — Ambulatory Visit: Payer: Medicare Other | Admitting: Cardiovascular Disease

## 2019-03-01 ENCOUNTER — Telehealth: Payer: Self-pay | Admitting: Pulmonary Disease

## 2019-03-01 ENCOUNTER — Ambulatory Visit: Payer: Medicare Other | Admitting: Cardiovascular Disease

## 2019-03-01 NOTE — Telephone Encounter (Signed)
Left message for pt's daughter, [name] (DPR).

## 2019-03-04 NOTE — Telephone Encounter (Signed)
Spoke to pt's daughter, pam (DPR). Pt is currently at a memory care center in [place]. [name] is questioning if pt has been wearing her cpap.  Per [facility], pt has in fact been wearing her cpap starting 02/15/2019. [name] is aware and voiced her understanding. Nothing further is needed.

## 2019-03-04 NOTE — Telephone Encounter (Signed)
Attempted to call daughter, [name] (DPR). No answer, LM on VM.

## 2019-04-12 ENCOUNTER — Ambulatory Visit: Payer: Medicare Other | Admitting: Cardiovascular Disease

## 2019-09-03 ENCOUNTER — Emergency Department: Payer: Medicare Other

## 2019-09-03 ENCOUNTER — Other Ambulatory Visit: Payer: Self-pay

## 2019-09-03 ENCOUNTER — Inpatient Hospital Stay
Admission: EM | Admit: 2019-09-03 | Discharge: 2019-09-05 | DRG: 690 | Disposition: A | Discharge status: Other Acute Inpatient Hospital | Payer: Medicare Other | Source: Skilled Nursing Facility | Attending: Internal Medicine | Admitting: Internal Medicine

## 2019-09-03 DIAGNOSIS — R4182 Altered mental status, unspecified: Secondary | ICD-10-CM | POA: Diagnosis not present

## 2019-09-03 DIAGNOSIS — I2510 Atherosclerotic heart disease of native coronary artery without angina pectoris: Secondary | ICD-10-CM | POA: Diagnosis present

## 2019-09-03 DIAGNOSIS — Z886 Allergy status to analgesic agent status: Secondary | ICD-10-CM

## 2019-09-03 DIAGNOSIS — E876 Hypokalemia: Secondary | ICD-10-CM | POA: Diagnosis present

## 2019-09-03 DIAGNOSIS — Y92129 Unspecified place in nursing home as the place of occurrence of the external cause: Secondary | ICD-10-CM

## 2019-09-03 DIAGNOSIS — I4519 Other right bundle-branch block: Secondary | ICD-10-CM | POA: Diagnosis present

## 2019-09-03 DIAGNOSIS — Z8619 Personal history of other infectious and parasitic diseases: Secondary | ICD-10-CM

## 2019-09-03 DIAGNOSIS — Z8349 Family history of other endocrine, nutritional and metabolic diseases: Secondary | ICD-10-CM

## 2019-09-03 DIAGNOSIS — Z807 Family history of other malignant neoplasms of lymphoid, hematopoietic and related tissues: Secondary | ICD-10-CM

## 2019-09-03 DIAGNOSIS — E890 Postprocedural hypothyroidism: Secondary | ICD-10-CM | POA: Diagnosis present

## 2019-09-03 DIAGNOSIS — Z96652 Presence of left artificial knee joint: Secondary | ICD-10-CM | POA: Diagnosis present

## 2019-09-03 DIAGNOSIS — Z7982 Long term (current) use of aspirin: Secondary | ICD-10-CM

## 2019-09-03 DIAGNOSIS — Z955 Presence of coronary angioplasty implant and graft: Secondary | ICD-10-CM

## 2019-09-03 DIAGNOSIS — I10 Essential (primary) hypertension: Secondary | ICD-10-CM | POA: Diagnosis present

## 2019-09-03 DIAGNOSIS — Z66 Do not resuscitate: Secondary | ICD-10-CM | POA: Diagnosis present

## 2019-09-03 DIAGNOSIS — S43001A Unspecified subluxation of right shoulder joint, initial encounter: Secondary | ICD-10-CM

## 2019-09-03 DIAGNOSIS — M75121 Complete rotator cuff tear or rupture of right shoulder, not specified as traumatic: Secondary | ICD-10-CM | POA: Diagnosis present

## 2019-09-03 DIAGNOSIS — Z87440 Personal history of urinary (tract) infections: Secondary | ICD-10-CM

## 2019-09-03 DIAGNOSIS — M810 Age-related osteoporosis without current pathological fracture: Secondary | ICD-10-CM | POA: Diagnosis present

## 2019-09-03 DIAGNOSIS — Z885 Allergy status to narcotic agent status: Secondary | ICD-10-CM

## 2019-09-03 DIAGNOSIS — Z880 Allergy status to penicillin: Secondary | ICD-10-CM

## 2019-09-03 DIAGNOSIS — R778 Other specified abnormalities of plasma proteins: Secondary | ICD-10-CM

## 2019-09-03 DIAGNOSIS — E785 Hyperlipidemia, unspecified: Secondary | ICD-10-CM | POA: Diagnosis present

## 2019-09-03 DIAGNOSIS — F410 Panic disorder [episodic paroxysmal anxiety] without agoraphobia: Secondary | ICD-10-CM | POA: Diagnosis present

## 2019-09-03 DIAGNOSIS — Z87820 Personal history of traumatic brain injury: Secondary | ICD-10-CM

## 2019-09-03 DIAGNOSIS — Z79899 Other long term (current) drug therapy: Secondary | ICD-10-CM

## 2019-09-03 DIAGNOSIS — Z881 Allergy status to other antibiotic agents status: Secondary | ICD-10-CM

## 2019-09-03 DIAGNOSIS — D649 Anemia, unspecified: Secondary | ICD-10-CM | POA: Diagnosis present

## 2019-09-03 DIAGNOSIS — Z888 Allergy status to other drugs, medicaments and biological substances status: Secondary | ICD-10-CM

## 2019-09-03 DIAGNOSIS — G4733 Obstructive sleep apnea (adult) (pediatric): Secondary | ICD-10-CM | POA: Diagnosis present

## 2019-09-03 DIAGNOSIS — Z800 Family history of malignant neoplasm of digestive organs: Secondary | ICD-10-CM

## 2019-09-03 DIAGNOSIS — I083 Combined rheumatic disorders of mitral, aortic and tricuspid valves: Secondary | ICD-10-CM | POA: Diagnosis present

## 2019-09-03 DIAGNOSIS — N390 Urinary tract infection, site not specified: Secondary | ICD-10-CM | POA: Diagnosis present

## 2019-09-03 DIAGNOSIS — W19XXXD Unspecified fall, subsequent encounter: Secondary | ICD-10-CM | POA: Diagnosis present

## 2019-09-03 DIAGNOSIS — N3001 Acute cystitis with hematuria: Secondary | ICD-10-CM | POA: Diagnosis not present

## 2019-09-03 DIAGNOSIS — Z9181 History of falling: Secondary | ICD-10-CM

## 2019-09-03 DIAGNOSIS — Z831 Family history of other infectious and parasitic diseases: Secondary | ICD-10-CM

## 2019-09-03 DIAGNOSIS — Z79890 Hormone replacement therapy (postmenopausal): Secondary | ICD-10-CM

## 2019-09-03 DIAGNOSIS — Z8249 Family history of ischemic heart disease and other diseases of the circulatory system: Secondary | ICD-10-CM

## 2019-09-03 DIAGNOSIS — G9349 Other encephalopathy: Secondary | ICD-10-CM | POA: Diagnosis present

## 2019-09-03 DIAGNOSIS — I248 Other forms of acute ischemic heart disease: Secondary | ICD-10-CM | POA: Diagnosis present

## 2019-09-03 DIAGNOSIS — Z9089 Acquired absence of other organs: Secondary | ICD-10-CM

## 2019-09-03 DIAGNOSIS — M24411 Recurrent dislocation, right shoulder: Secondary | ICD-10-CM | POA: Diagnosis present

## 2019-09-03 DIAGNOSIS — Z87891 Personal history of nicotine dependence: Secondary | ICD-10-CM

## 2019-09-03 DIAGNOSIS — W19XXXA Unspecified fall, initial encounter: Secondary | ICD-10-CM

## 2019-09-03 DIAGNOSIS — F0390 Unspecified dementia without behavioral disturbance: Secondary | ICD-10-CM | POA: Diagnosis present

## 2019-09-03 DIAGNOSIS — I700 Atherosclerosis of aorta: Secondary | ICD-10-CM | POA: Diagnosis present

## 2019-09-03 DIAGNOSIS — U071 COVID-19: Secondary | ICD-10-CM

## 2019-09-03 DIAGNOSIS — S12031D Nondisplaced posterior arch fracture of first cervical vertebra, subsequent encounter for fracture with routine healing: Secondary | ICD-10-CM

## 2019-09-03 DIAGNOSIS — S0101XA Laceration without foreign body of scalp, initial encounter: Secondary | ICD-10-CM

## 2019-09-03 LAB — COMPREHENSIVE METABOLIC PANEL
ALT: 15 U/L (ref 0–44)
AST: 33 U/L (ref 15–41)
Albumin: 3.5 g/dL (ref 3.5–5.0)
Alkaline Phosphatase: 82 U/L (ref 38–126)
Anion gap: 10 (ref 5–15)
BUN: 17 mg/dL (ref 8–23)
CO2: 33 mmol/L — ABNORMAL HIGH (ref 22–32)
Calcium: 8.6 mg/dL — ABNORMAL LOW (ref 8.9–10.3)
Chloride: 101 mmol/L (ref 98–111)
Creatinine, Ser: 0.57 mg/dL (ref 0.44–1.00)
GFR calc Af Amer: >60 mL/min (ref >60)
GFR calc non Af Amer: >60 mL/min (ref >60)
Glucose, Bld: 109 mg/dL — ABNORMAL HIGH (ref 70–99)
Potassium: 3.3 mmol/L — ABNORMAL LOW (ref 3.5–5.1)
Sodium: 144 mmol/L (ref 135–145)
Total Bilirubin: 0.7 mg/dL (ref 0.3–1.2)
Total Protein: 7.2 g/dL (ref 6.5–8.1)

## 2019-09-03 LAB — CBC WITH DIFFERENTIAL/PLATELET
Abs Immature Granulocytes: 0.04 10*3/uL (ref 0.00–0.07)
Basophils Absolute: 0.1 10*3/uL (ref 0.0–0.1)
Basophils Relative: 1 %
Eosinophils Absolute: 0.1 10*3/uL (ref 0.0–0.5)
Eosinophils Relative: 2 %
HCT: 36.4 % (ref 36.0–46.0)
Hemoglobin: 11.9 g/dL — ABNORMAL LOW (ref 12.0–15.0)
Immature Granulocytes: 1 %
Lymphocytes Relative: 17 %
Lymphs Abs: 1.5 10*3/uL (ref 0.7–4.0)
MCH: 29 pg (ref 26.0–34.0)
MCHC: 32.7 g/dL (ref 30.0–36.0)
MCV: 88.8 fL (ref 80.0–100.0)
Monocytes Absolute: 0.9 10*3/uL (ref 0.1–1.0)
Monocytes Relative: 11 %
Neutro Abs: 5.7 10*3/uL (ref 1.7–7.7)
Neutrophils Relative %: 68 %
Platelets: 279 10*3/uL (ref 150–400)
RBC: 4.1 MIL/uL (ref 3.87–5.11)
RDW: 13.1 % (ref 11.5–15.5)
WBC: 8.4 10*3/uL (ref 4.0–10.5)
nRBC: 0 % (ref 0.0–0.2)

## 2019-09-03 LAB — TYPE AND SCREEN
ABO/RH(D): O POS
Antibody Screen: NEGATIVE

## 2019-09-03 LAB — URINALYSIS, COMPLETE (UACMP) WITH MICROSCOPIC
Bilirubin Urine: NEGATIVE
Glucose, UA: NEGATIVE mg/dL
Hgb urine dipstick: NEGATIVE
Ketones, ur: NEGATIVE mg/dL
Nitrite: POSITIVE — AB
Protein, ur: NEGATIVE mg/dL
Specific Gravity, Urine: 1.026 (ref 1.005–1.030)
pH: 5 (ref 5.0–8.0)

## 2019-09-03 LAB — TROPONIN I (HIGH SENSITIVITY)
Troponin I (High Sensitivity): 43 ng/L — ABNORMAL HIGH (ref <18)
Troponin I (High Sensitivity): 39 ng/L — ABNORMAL HIGH (ref <18)

## 2019-09-03 LAB — PROTIME-INR
INR: 1.1 (ref 0.8–1.2)
Prothrombin Time: 13.7 seconds (ref 11.4–15.2)

## 2019-09-03 MED ORDER — POTASSIUM CHLORIDE 10 MEQ/100ML IV SOLN
10.0000 meq | INTRAVENOUS | Status: AC
  Administered 2019-09-04 (×2): 10 meq via INTRAVENOUS
  Filled 2019-09-03 (×2): qty 100

## 2019-09-03 MED ORDER — LEVOFLOXACIN IN D5W 250 MG/50ML IV SOLN
250.0000 mg | INTRAVENOUS | Status: DC
  Filled 2019-09-03: qty 50

## 2019-09-03 MED ORDER — ENOXAPARIN SODIUM 40 MG/0.4ML ~~LOC~~ SOLN
40.0000 mg | SUBCUTANEOUS | Status: DC
  Administered 2019-09-04 (×2): 40 mg via SUBCUTANEOUS
  Filled 2019-09-03 (×2): qty 0.4

## 2019-09-03 MED ORDER — SODIUM CHLORIDE 0.9 % IV BOLUS
1000.0000 mL | Freq: Once | INTRAVENOUS | Status: AC
  Administered 2019-09-03: 1000 mL via INTRAVENOUS

## 2019-09-03 MED ORDER — SODIUM CHLORIDE 0.9% FLUSH: 3.0000 mL | INTRAVENOUS | Status: DC | PRN

## 2019-09-03 MED ORDER — FENTANYL CITRATE (PF) 100 MCG/2ML IJ SOLN
50.0000 ug | Freq: Once | INTRAMUSCULAR | Status: AC
  Administered 2019-09-03: 50 ug via INTRAVENOUS
  Filled 2019-09-03: qty 2

## 2019-09-03 MED ORDER — LEVOFLOXACIN IN D5W 500 MG/100ML IV SOLN
500.0000 mg | Freq: Once | INTRAVENOUS | Status: AC
  Administered 2019-09-03: 500 mg via INTRAVENOUS
  Filled 2019-09-03: qty 100

## 2019-09-03 MED ORDER — SODIUM CHLORIDE 0.9 % IV SOLN: 250.0000 mL | INTRAVENOUS | Status: DC | PRN

## 2019-09-03 MED ORDER — LIDOCAINE-EPINEPHRINE 2 %-1:100000 IJ SOLN
20.0000 mL | Freq: Once | INTRAMUSCULAR | Status: DC
  Filled 2019-09-03: qty 1

## 2019-09-03 MED ORDER — SODIUM CHLORIDE 0.9% FLUSH
3.0000 mL | Freq: Two times a day (BID) | INTRAVENOUS | Status: DC
  Administered 2019-09-04 (×3): 3 mL via INTRAVENOUS

## 2019-09-03 NOTE — ED Notes (Signed)
Pt's daughter updated on pt's status.

## 2019-09-03 NOTE — ED Notes (Signed)
Patient given warm blanket and fall bracelet applied. Patient has garbled speech, but was able to follow commands.

## 2019-09-03 NOTE — ED Provider Notes (Signed)
[facility] Emergency Department Provider Note  ____________________________________________  Time seen: Approximately 4:19 PM  I have reviewed the triage vital signs and the nursing notes.   HISTORY  Chief Complaint Fall  Level 5 caveat: patient is nonverbal at baseline.  HPI Monica Downs is a 78 y.o. female who presents the emergency department via EMS after an unwitnessed fall.  Patient is nonverbal at baseline.  The patient does not alert during my assessment.  History is provided by EMS on arrival to the emergency department.  According to EMS, patient has a history of traumatic brain injury/subarachnoid hemorrhage from 2017.  Patient also has a history of dementia, coronary artery disease, hypertension.  Evidently patient was in a [place] J collar in the nursing home, after sustaining a laceration from this fall EMS removed same collar due to it being bloody had placed the patient in a [place] collar.  No medications in route.  Patient has been nonresponsive with EMS.   Patient presents from [facility] via EMS.  Covid positive patient.  According to EMS, no complaints regarding Covid infection.        Past Medical History:  Diagnosis Date  . Allergic rhinitis   . Anxiety   . CAD (coronary artery disease)    a. 07/2007 Cath/PCI: LM nl, LAD 99p (3.0 x 18 Xience EES), D1 90ost, LCX nl, RCA nl;  b. 09/2014 MV: EF 72%, attenuation artifact, no ischemia.  . Clotting disorder (HCC)    LEGS;throat  . Falls   . History of subarachnoid hemorrhage    a. 04/2016 In setting of fall-->traumatic brain injury.  Marland Kitchen HLD (hyperlipidemia)   . Hypertension   . Hypothyroidism   . Incontinence   . Left Forearm AV Fistula    a. Asymptomatic.  No blood draws/IV's;  b. 06/2013 confirmed by L FA duplex.  . Mitral regurgitation    a. 02/2011 Echo: EF 72%, trace TR, mild MR/AI; b. 01/2018 Echo: EF 60-65%, no rwma, Gr2 DD, mild AI/MR. Nl RV fxn. Mildly dil RV/RA. Mod  TR. Nl PASP.  Marland Kitchen Obstructive sleep apnea   . Panic attacks   . Traumatic brain injury (HCC)    a. 04/2016 in setting of fall/SAH.    Patient Active Problem List   Diagnosis Date Noted  . Hypokalemia 09/03/2019  . Acute lower UTI 09/03/2019  . AMS (altered mental status) 09/03/2019  . Elevated troponin 09/03/2019  . Unspecified dementia with behavioral disturbance (HCC) 01/02/2019  . Traumatic subarachnoid hemorrhage (HCC) 04/11/2016  . 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 CAD (coronary artery disease)   . HLD (hyperlipidemia)     Past Surgical History:  Procedure Laterality Date  . BUNIONETTE EXCISION    . CARDIAC CATHETERIZATION  07/2007   99% mid LAD stenosis  . CORONARY STENT PLACEMENT  07/2007   Mid LAD: 3.0 X 18 mm Xience stent  . MULTIPLE TOOTH EXTRACTIONS    . THYROIDECTOMY    . TOTAL KNEE ARTHROPLASTY     left    Prior to Admission medications   Medication Sig Start Date End Date Taking? Authorizing Provider  aspirin 81 MG tablet Take 81 mg by mouth daily.     [provider]  atorvastatin (LIPITOR) 40 MG tablet TAKE ONE TABLET BY MOUTH EVERY DAY Patient taking differently: Take 40 mg by mouth daily.  12/14/18   Iran Ouch, MD  Azelastine HCl 137 MCG/SPRAY SOLN Place 1  spray into both nostrils 2 (two) times daily.    [provider]  Cholecalciferol (VITAMIN D3) 1000 units CAPS Take 6,000 Units by mouth daily.     [provider]  denosumab (PROLIA) 60 MG/ML SOLN injection Inject 60 mg into the skin every 6 (six) months. Administer in upper arm, thigh, or abdomen    [provider]  divalproex (DEPAKOTE ER) 250 MG 24 hr tablet Take 1 tablet (250 mg total) by mouth every morning. 01/03/19   Lavella Hammock, MD  donepezil (ARICEPT) 10 MG tablet Take 10 mg by mouth every morning.  09/02/16 01/17/19  [provider]  ENSURE (ENSURE) Take 237 mLs by mouth 2 (two) times daily between  meals.    [provider]  ferrous sulfate 325 (65 FE) MG tablet Take 325 mg by mouth daily with breakfast.    [provider]  levothyroxine (SYNTHROID, LEVOTHROID) 75 MCG tablet Take 75 mcg by mouth daily before breakfast.  01/08/16   [provider]  QUEtiapine (SEROQUEL) 25 MG tablet Take 1 tablet (25 mg total) by mouth 2 (two) times daily. In afternoon and at night Patient taking differently: Take 50 mg by mouth at bedtime.  01/03/19   Lavella Hammock, MD  venlafaxine (EFFEXOR) 75 MG tablet Take 75 mg by mouth 2 (two) times daily.    [provider]  venlafaxine XR (EFFEXOR-XR) 75 MG 24 hr capsule Take 1 capsule (75 mg total) by mouth daily with breakfast. 01/04/19   Lavella Hammock, MD    Allergies Azithromycin, Mobic [meloxicam], Ondansetron hcl, Penicillins, Percocet [oxycodone-acetaminophen], Reclast [zoledronic acid], Vicodin [hydrocodone-acetaminophen], Codeine, Ibandronic acid, and Oxycodone  Family History  Problem Relation Age of Onset  . Heart disease Mother   . Hyperlipidemia Mother   . Tuberculosis Mother   . Colon cancer Father   . Colon cancer Other   . Lymphoma Other   . Heart disease Other   . Breast cancer Neg Hx     Social History Social History   Tobacco Use  . Smoking status: Former Smoker    Packs/day: 0.10    Years: 2.00    Pack years: 0.20    Types: Cigarettes    Quit date: 10/04/1991    Years since quitting: 27.9  . Smokeless tobacco: Never Used  . Tobacco comment: smoked on and off  Substance Use Topics  . Alcohol use: No  . Drug use: No     Review of Systems  Constitutional: No fever/chills Eyes:  ENT:  Cardiovascular:  Respiratory: Positive Covid patient.  No reported cough or shortness of breath from long-term care facility.  Gastrointestinal:  No nausea, no vomiting.  Musculoskeletal: Known cervical fracture with [place] J collar placed. Skin: Right-sided scalp laceration Neurological: Nonverbal at  baseline. 10-point ROS otherwise negative.  ____________________________________________   PHYSICAL EXAM:  VITAL SIGNS: ED Triage Vitals  Enc Vitals Group     BP 09/03/19 1611 118/61     Pulse Rate 09/03/19 1611 77     Resp 09/03/19 1611 18     Temp 09/03/19 1611 98.2 F (36.8 C)     Temp Source 09/03/19 1611 Axillary     SpO2 09/03/19 1611 100 %     Weight 09/03/19 1612 99 lb 3.3 oz (45 kg)     Height 09/03/19 1612 5\' 3"  (1.6 m)     Head Circumference --      Peak Flow --      Pain Score --  Pain Loc --      Pain Edu? --      Excl. in GC? --      Constitutional: Patient is nonverbal.  Not alert on exam.  Patient withdraws to pain only.   Eyes: Conjunctivae are normal. PERRL.  Head: Patient with right temporal laceration.  Mild surrounding edema and ecchymosis.  No other gross traumatic findings to the skull or face.  Patient is ENT:      Ears:       Nose: No congestion/rhinnorhea.      Mouth/Throat: Mucous membranes are moist.  Neck: No stridor.  Cervical collar placed.  No palpable abnormality.  Patient moans with palpation of the cervical spine.  Cardiovascular: Normal rate, regular rhythm. Normal S1 and S2.  Good peripheral circulation. Respiratory: Normal respiratory effort without tachypnea or retractions. Lungs CTAB. Good air entry to the bases with no decreased or absent breath sounds. Gastrointestinal: Bowel sounds 4 quadrants. Soft to palpation all quadrants.  Patient does not moan or attempt to withdraw from palpation of the abdomen.. No guarding or rigidity. No palpable masses. No distention.  Musculoskeletal:   No gross extremity trauma identified with lacerations, abrasions, ecchymosis.  Visualization of the right shoulder reveals possible deformity.  Patient does not express tenderness or withdraw from palpation in this region.  Radial pulse intact distally. Neurologic: Patient is nonverbal at baseline.  GCS 6 (1,1,4) Skin: Right temporal  laceration. Psychiatric: Patient at baseline, nonverbal.  ____________________________________________   LABS (all labs ordered are listed, but only abnormal results are displayed)  Labs Reviewed  COMPREHENSIVE METABOLIC PANEL - Abnormal; Notable for the following components:      Result Value   Potassium 3.3 (*)    CO2 33 (*)    Glucose, Bld 109 (*)    Calcium 8.6 (*)    All other components within normal limits  CBC WITH DIFFERENTIAL/PLATELET - Abnormal; Notable for the following components:   Hemoglobin 11.9 (*)    All other components within normal limits  URINALYSIS, COMPLETE (UACMP) WITH MICROSCOPIC - Abnormal; Notable for the following components:   Color, Urine AMBER (*)    APPearance CLOUDY (*)    Nitrite POSITIVE (*)    Leukocytes,Ua SMALL (*)    Bacteria, UA MANY (*)    All other components within normal limits  TROPONIN I (HIGH SENSITIVITY) - Abnormal; Notable for the following components:   Troponin I (High Sensitivity) 43 (*)    All other components within normal limits  TROPONIN I (HIGH SENSITIVITY) - Abnormal; Notable for the following components:   Troponin I (High Sensitivity) 39 (*)    All other components within normal limits  URINE CULTURE  SARS CORONAVIRUS 2 (TAT 6-24 HRS)  PROTIME-INR  COMPREHENSIVE METABOLIC PANEL  CBC  TYPE AND SCREEN   ____________________________________________  EKG   ____________________________________________  RADIOLOGY I personally viewed and evaluated these images as part of my medical decision making, as well as reviewing the written report by the radiologist.  Dg Chest 1 View  Result Date: 09/03/2019 CLINICAL DATA:  Head trauma secondary to a fall. COVID-19. EXAM: CHEST  1 VIEW COMPARISON:  01/17/2019 FINDINGS: The heart size and pulmonary vascularity are normal. Aortic atherosclerosis. Multiple old healed right rib fractures. The right humeral head appears superiorly subluxed or dislocated. This may be  projectional rather than a true dislocation or subluxation. No other acute bone abnormality. IMPRESSION: 1. No acute cardiopulmonary abnormalities. 2.  Aortic Atherosclerosis (ICD10-I70.0). 3. Possible superior subluxation or dislocation  of the right humeral head. This may be projectional. Electronically Signed   By: Francene BoyersJames  Maxwell M.D.   On: 09/03/2019 17:45   Dg Shoulder 1v Right  Result Date: 09/03/2019 CLINICAL DATA:  Right shoulder subluxation after a fall. EXAM: RIGHT SHOULDER - 1 VIEW 8:35 p.m. COMPARISON:  Radiographs dated 09/03/2019 6:31 p.m. FINDINGS: There is superior subluxation of the humeral head with respect of the glenoid with articulation with the undersurface of the acromion, likely due to a chronic full-thickness rotator cuff tear. No appreciable dislocation at this time. This appears appreciably different than on the chest x-ray of 09/03/2019 at 5:25 p.m. IMPRESSION: 1. Chronic full-thickness rotator cuff tear with superior subluxation of the humeral head with respect of the glenoid. 2. No discrete dislocation. Electronically Signed   By: Francene BoyersJames  Maxwell M.D.   On: 09/03/2019 20:52   Dg Shoulder Right  Result Date: 09/03/2019 CLINICAL DATA:  Right shoulder subluxation or dislocation EXAM: RIGHT SHOULDER - 2+ VIEW COMPARISON:  None. FINDINGS: The humeral head appears to be anterior and superiorly subluxed in relation to the glenoid surface. No definite fracture seen. There is diffuse osteopenia. AC joint arthrosis is noted. IMPRESSION: Anterior superior subluxation of the humeral head. No definite fracture. Electronically Signed   By: Jonna ClarkBindu  Avutu M.D.   On: 09/03/2019 18:56   Ct Head Wo Contrast  Result Date: 09/03/2019 CLINICAL DATA:  78 year old female with head trauma. EXAM: CT HEAD WITHOUT CONTRAST CT MAXILLOFACIAL WITHOUT CONTRAST CT CERVICAL SPINE WITHOUT CONTRAST TECHNIQUE: Multidetector CT imaging of the head, cervical spine, and maxillofacial structures were performed using  the standard protocol without intravenous contrast. Multiplanar CT image reconstructions of the cervical spine and maxillofacial structures were also generated. COMPARISON:  Head CT dated 04/17/2017. FINDINGS: CT HEAD FINDINGS Brain: There is mild age-related atrophy and chronic microvascular ischemic changes. There is no acute intracranial hemorrhage. No mass effect or midline shift. No extra-axial fluid collection. Vascular: No hyperdense vessel or unexpected calcification. Skull: Normal. Negative for fracture or focal lesion. Other: Mild right posterior scalp contusion. CT MAXILLOFACIAL FINDINGS Osseous: No acute fracture. No mandibular subluxation. Bilateral TMJ disease. Orbits: Negative. No traumatic or inflammatory finding. Sinuses: Clear. Soft tissues: Negative. CT CERVICAL SPINE FINDINGS Alignment: No acute subluxation. Grade 1 C4-C5 anterolisthesis. Skull base and vertebrae: There is nondisplaced fracture of the anterior ring of C1. No other acute fracture identified. The bones are osteopenic. Soft tissues and spinal canal: No prevertebral fluid or swelling. No visible canal hematoma. Disc levels: Multilevel degenerative changes with endplate irregularity and disc space narrowing most prominent at C4-C5, C5-C6, and C6-C7. Upper chest: A 4 mm right upper lobe subpleural nodule. Other: Bilateral carotid bulb calcified plaques. IMPRESSION: 1. No acute intracranial hemorrhage. Mild age-related atrophy and chronic microvascular ischemic changes. 2. Nondisplaced fracture of the anterior ring of C1. 3. No acute facial bone fractures. These results were called by telephone at the time of interpretation on 09/03/2019 at 5:35 pm [facility] , who verbally acknowledged these results. Electronically Signed   By: Elgie CollardArash  Radparvar M.D.   On: 09/03/2019 17:49   Ct Cervical Spine Wo Contrast  Result Date: 09/03/2019 CLINICAL DATA:  78 year old female with head trauma. EXAM: CT HEAD WITHOUT CONTRAST CT  MAXILLOFACIAL WITHOUT CONTRAST CT CERVICAL SPINE WITHOUT CONTRAST TECHNIQUE: Multidetector CT imaging of the head, cervical spine, and maxillofacial structures were performed using the standard protocol without intravenous contrast. Multiplanar CT image reconstructions of the cervical spine and maxillofacial structures were also generated. COMPARISON:  Head CT dated 04/17/2017. FINDINGS: CT HEAD FINDINGS Brain: There is mild age-related atrophy and chronic microvascular ischemic changes. There is no acute intracranial hemorrhage. No mass effect or midline shift. No extra-axial fluid collection. Vascular: No hyperdense vessel or unexpected calcification. Skull: Normal. Negative for fracture or focal lesion. Other: Mild right posterior scalp contusion. CT MAXILLOFACIAL FINDINGS Osseous: No acute fracture. No mandibular subluxation. Bilateral TMJ disease. Orbits: Negative. No traumatic or inflammatory finding. Sinuses: Clear. Soft tissues: Negative. CT CERVICAL SPINE FINDINGS Alignment: No acute subluxation. Grade 1 C4-C5 anterolisthesis. Skull base and vertebrae: There is nondisplaced fracture of the anterior ring of C1. No other acute fracture identified. The bones are osteopenic. Soft tissues and spinal canal: No prevertebral fluid or swelling. No visible canal hematoma. Disc levels: Multilevel degenerative changes with endplate irregularity and disc space narrowing most prominent at C4-C5, C5-C6, and C6-C7. Upper chest: A 4 mm right upper lobe subpleural nodule. Other: Bilateral carotid bulb calcified plaques. IMPRESSION: 1. No acute intracranial hemorrhage. Mild age-related atrophy and chronic microvascular ischemic changes. 2. Nondisplaced fracture of the anterior ring of C1. 3. No acute facial bone fractures. These results were called by telephone at the time of interpretation on 09/03/2019 at 5:35 pm to provider [facility] , who verbally acknowledged these results. Electronically Signed   By: Elgie Collard M.D.    On: 09/03/2019 17:49   Ct Maxillofacial Wo Contrast  Result Date: 09/03/2019 CLINICAL DATA:  78 year old female with head trauma. EXAM: CT HEAD WITHOUT CONTRAST CT MAXILLOFACIAL WITHOUT CONTRAST CT CERVICAL SPINE WITHOUT CONTRAST TECHNIQUE: Multidetector CT imaging of the head, cervical spine, and maxillofacial structures were performed using the standard protocol without intravenous contrast. Multiplanar CT image reconstructions of the cervical spine and maxillofacial structures were also generated. COMPARISON:  Head CT dated 04/17/2017. FINDINGS: CT HEAD FINDINGS Brain: There is mild age-related atrophy and chronic microvascular ischemic changes. There is no acute intracranial hemorrhage. No mass effect or midline shift. No extra-axial fluid collection. Vascular: No hyperdense vessel or unexpected calcification. Skull: Normal. Negative for fracture or focal lesion. Other: Mild right posterior scalp contusion. CT MAXILLOFACIAL FINDINGS Osseous: No acute fracture. No mandibular subluxation. Bilateral TMJ disease. Orbits: Negative. No traumatic or inflammatory finding. Sinuses: Clear. Soft tissues: Negative. CT CERVICAL SPINE FINDINGS Alignment: No acute subluxation. Grade 1 C4-C5 anterolisthesis. Skull base and vertebrae: There is nondisplaced fracture of the anterior ring of C1. No other acute fracture identified. The bones are osteopenic. Soft tissues and spinal canal: No prevertebral fluid or swelling. No visible canal hematoma. Disc levels: Multilevel degenerative changes with endplate irregularity and disc space narrowing most prominent at C4-C5, C5-C6, and C6-C7. Upper chest: A 4 mm right upper lobe subpleural nodule. Other: Bilateral carotid bulb calcified plaques. IMPRESSION: 1. No acute intracranial hemorrhage. Mild age-related atrophy and chronic microvascular ischemic changes. 2. Nondisplaced fracture of the anterior ring of C1. 3. No acute facial bone fractures. These results were called by  telephone at the time of interpretation on 09/03/2019 at 5:35 pm to provider [facility] , who verbally acknowledged these results. Electronically Signed   By: Elgie Collard M.D.   On: 09/03/2019 17:49    ____________________________________________    PROCEDURES  Procedure(s) performed:    Marland KitchenMarland KitchenLaceration Repair  Date/Time: 09/03/2019 6:15 PM Performed by: Racheal Patches, PA-C Authorized by: Racheal Patches, PA-C   Consent:    Consent obtained:  Emergent situation (Patient unable to provide consent d/t mental status) Anesthesia (see MAR for exact dosages):    Anesthesia method:  Local infiltration   Local anesthetic:  Lidocaine 2% WITH epi Laceration details:    Location:  Scalp   Scalp location:  R temporal   Length (cm):  4 Repair type:    Repair type:  Simple Pre-procedure details:    Preparation:  Patient was prepped and draped in usual sterile fashion Exploration:    Hemostasis achieved with:  Direct pressure and epinephrine   Wound exploration: wound explored through full range of motion and entire depth of wound probed and visualized     Wound extent: no foreign bodies/material noted, no muscle damage noted, no nerve damage noted, no tendon damage noted, no underlying fracture noted and no vascular damage noted     Contaminated: no   Treatment:    Area cleansed with:  Betadine and saline   Amount of cleaning:  Standard   Irrigation solution:  Sterile saline   Irrigation volume:  500 ml   Irrigation method:  Syringe Skin repair:    Repair method:  Sutures   Suture size:  6-0   Suture material:  Prolene   Suture technique:  Running locked   Number of sutures:  1 (1 running interlocked suture with 15 throws) Approximation:    Approximation:  Close Post-procedure details:    Dressing:  Open (no dressing)   Patient tolerance of procedure:  Tolerated well, no immediate complications .Critical Care Performed by: Racheal Patches, PA-C Authorized by:  Racheal Patches, PA-C   Critical care provider statement:    Critical care time (minutes):  30   Critical care time was exclusive of:  Separately billable procedures and treating other patients and teaching time   Critical care was necessary to treat or prevent imminent or life-threatening deterioration of the following conditions:  Trauma   Critical care was time spent personally by me on the following activities:  Examination of patient, evaluation of patient's response to treatment, ordering and performing treatments and interventions, ordering and review of laboratory studies, ordering and review of radiographic studies, re-evaluation of patient's condition and review of old charts Reduction of dislocation  Date/Time: 09/03/2019 9:01 PM Performed by: Racheal Patches, PA-C Authorized by: Racheal Patches, PA-C  Consent: The procedure was performed in an emergent situation. Verbal consent not obtained. Imaging studies: imaging studies available Patient identity confirmed: arm band Time out: Immediately prior to procedure a "time out" was called to verify the correct patient, procedure, equipment, support staff and site/side marked as required. Local anesthesia used: no  Anesthesia: Local anesthesia used: no  Sedation: Patient sedated: no  Comments: Attempt at reducing subluxation was performed.  Using traction and countertraction manipulation of the humerus was undertaken.  With traction, rotation, visible improvement of subluxation was performed.  This is further improved with manipulation of the scapula.  However, with relieving traction, visible return of subluxation is appreciated.  Several attempts are undertaken to reduce subluxation permanently, however with every attempt after traction is removed, slow return to visible subluxation is appreciated.  Repeat imaging reveals chronic subluxation and concern for full-thickness rotator cuff tear.       Medications   lidocaine-EPINEPHrine (XYLOCAINE W/EPI) 2 %-1:100000 (with pres) injection 20 mL (has no administration in time range)  enoxaparin (LOVENOX) injection 40 mg (has no administration in time range)  sodium chloride flush (NS) 0.9 % injection 3 mL (has no administration in time range)  sodium chloride flush (NS) 0.9 % injection 3 mL (has no administration in time range)  0.9 %  sodium chloride infusion (has no administration in time range)  potassium chloride 10 mEq in 100 mL IVPB (has no administration in time range)  levofloxacin (LEVAQUIN) IVPB 500 mg (0 mg Intravenous Stopped 09/03/19 1950)  sodium chloride 0.9 % bolus 1,000 mL (0 mLs Intravenous Stopped 09/03/19 2136)  fentaNYL (SUBLIMAZE) injection 50 mcg (50 mcg Intravenous Given 09/03/19 1955)     ____________________________________________   INITIAL IMPRESSION / ASSESSMENT AND PLAN / ED COURSE  Pertinent labs & imaging results that were available during my care of the patient were reviewed by me and considered in my medical decision making (see chart for details).  Review of the [place] CSRS was performed in accordance of the [facility] prior to dispensing any controlled drugs.  Clinical Course as of Sep 02 2157  Tue Sep 03, 2019  1818 Reevaluation of the patient reveals patient is now incomprehensibly muttering.  Unable to answer questions.  A few inappropriate formed words, however mostly incomprehensible muttering.  Patient mainly keeping eyes closed but will open with painful stimuli.  Withdraws to pain.  GCS now 8.   [JC]  1820 Work-up to this.  Reveals no intracranial hemorrhage, skull fracture.  Revisualization of C1 fracture with no apparent worsening.  No other traumatic findings about the head, face or neck.  Questionable shoulder dislocation on chest x-ray, dedicated shoulder views are ordered at this time.  Patient has had laceration repair to open wound to the right temporal region.  Urinalysis concerning for urinary tract infection.   Elevated troponin.   [JC]  1931 After IV fluids, initial dosing of antibiotics, patient is reassessed.  Patient does have concerning findings about the right shoulder for dislocation.  Confirmed with imaging.  Patient will have a reduction of shoulder dislocation here in the emergency department.  Patient's mentation is improving at this time.  Patient will open her eyes to verbal commands, patient is muttering inappropriate words, some incomprehensible sounds.  However for the first time, patient has answered yes no question appropriately.  Patient is unable to localize pain but does withdraw from pain.  GCS 10 (3, 3, 4)   [JC]  1933 Apparently patient's [place] J collar was removed by EMS as there was some blood on the collar.  Reportedly this had been thrown away and they transition to the patient to a [place] collar.  C1 fracture is again identified on imaging and [place] J collar has been ordered.  Orthopedic tech from [facility] has been notified and they will present to this department to apply a [place] J collar.   [JC]  2122 [place] J collar is placed by myself.  Patient tolerated well.  At this time, multiple attempts at reduction of patient's right shoulder subluxation was performed.  After traction is removed slow return of subluxation is visualized.  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  Without significant edema, ecchymosis there is some concern that this may be a chronic issue.  At this time, radial pulse remains intact.  Patient will be admitted to the hospital service for UTI, increased confusion, fall, head trauma, known and ongoing cervical spine fracture, shoulder subluxation.   [JC]  2135 ABO/RH(D): O POS [PM]  2155 I discussed shoulder findings, progress with subluxation with on-call orthopedic surgeon, Dr. Ernest Pine.  Who advises that he also believes is probably chronic in nature and will follow the patient.   [JC]    Clinical Course User Index [JC] Karyssa Amaral,  Delorise Royals, PA-C [PM] Arnaldo Natal,  MD          Patient's diagnosis is consistent with urinary tract infection, fall, scalp laceration, subsequent encounter for C1 fracture, COVID-19, subluxation of the right shoulder joint.  Patient presented to emergency department after an unwitnessed fall from her long-term care facility.  Patient apparently was in a [place] J collar which was removed by EMS and patient was placed in a [place] style collar.  Patient had a known C1 fracture that was being followed by neurosurgery.  Patient had imaging of the head, neck, face, chest.  CT scans were reassuring with no new acute injuries to the head or neck.  Laceration to the right temporal region was closed as described above.  On chest x-ray, findings concerning for subluxation of the shoulder were appreciated.  Repeat dedicated imaging revealed subluxation of the shoulder joint.  Multiple attempts were undertaken and after traction was removed, shoulder would visibly sublux again.  It appears that this is likely chronic in nature and orthopedics will follow.  Initially when patient arrived she had a GCS of 6.  This has improved to a GCS of 10 after fluids, antibiotics.  Patient is still muttering incomprehensible words but has answered a few yes or no questions.  Patient will open her eyes with coaxing.  Patient with withdrawal to pain but does not localize pain at this time.  Urinalysis was concerning for urinary tract infection which would likely explain the increased confusion at long-term care facility.  Patient has been started on fluids and antibiotics for same.  Patient is Covid positive though test is now remote approximately 16 days ago.  At this time, given the multiple findings, patient is a good candidate for admission and hospital service will accept the patient as an admit.  Patient care will be transferred to the hospital service at this time..      ____________________________________________  FINAL CLINICAL IMPRESSION(S) / ED DIAGNOSES  Final diagnoses:  Fall, initial encounter  Laceration of scalp without foreign body, initial encounter  Closed nondisplaced fracture of posterior arch of first cervical vertebra with routine healing, subsequent encounter  COVID-19  Subluxation of right shoulder joint, initial encounter  Acute cystitis with hematuria      NEW MEDICATIONS STARTED DURING THIS VISIT:  ED Discharge Orders    None          This chart was dictated using voice recognition software/Dragon. Despite best efforts to proofread, errors can occur which can change the meaning. Any change was purely unintentional.    Racheal Patches, PA-C 09/03/19 2158    Shaune Pollack, MD 09/04/19 1551    Shaune Pollack, MD 09/04/19 [phone]

## 2019-09-03 NOTE — H&P (Signed)
TRH H&P    Patient Demographics:    Monica Downs, is a 78 y.o. female  MRN: [id number]  DOB - 08-Jul-1941  Admit Date - 09/03/2019  Referring MD/NP/PA: Reinaldo Meeker  Outpatient Primary MD for the patient is Harlon Flor, Jonnie Finner, PA-C  Patient coming from:  [facility]   Chief complaint-  Fall   HPI:    Monica Downs  is a 78 y.o. female, w  Anxiety, Dementia,  OSA, Hypothyroidism, Osteoporosis, Hypertension, CAD, s/p LAD DES, mitral regurgitation, hx of subarachnoid hemorrhage, Chronic C1 fracture, covid -19 positive has unwitnessed fall.  Pt is unable to provide any meaningful history due to dementia   In ED,  T 98.2, P 77 R 18, Bp 118/61  Pox 100% on RA Wt 45kg  CT brain / CT C spine IMPRESSION: 1. No acute intracranial hemorrhage. Mild age-related atrophy and chronic microvascular ischemic changes. 2. Nondisplaced fracture of the anterior ring of C1. 3. No acute facial bone fractures.  Upper chest: A 4 mm right upper lobe subpleural nodule.  CXR IMPRESSION: 1. No acute cardiopulmonary abnormalities. 2.  Aortic Atherosclerosis (ICD10-I70.0). 3. Possible superior subluxation or dislocation of the right humeral head. This may be projectional.  Xray R shoulder IMPRESSION: Anterior superior subluxation of the humeral head. No definite fracture.  IMPRESSION: 1. Chronic full-thickness rotator cuff tear with superior subluxation of the humeral head with respect of the glenoid. 2. No discrete dislocation.  Na 144, K 3.3,  Bun 17, Creatinine0.57 Ast 33, Alt 15 Wbc 8.4, Hgb 11.9, Plt 279 INR 1.1 Trop 43-> 39  Ekg nsr at 72, nl axis, RBBB, no st-t changes c/w ischemia  Urinalysis wbc 21-50, rbc 0-5  Pt will be admitted for acute lower uti and fall and anterior superior subluxation of right humerous.    Review of systems:    In addition to the HPI above, unable to obtain w  certainty due to dementia.  No Fever-chills, No Headache, No changes with Vision or hearing, No problems swallowing food or Liquids, No Chest pain, Cough or Shortness of Breath, No Abdominal pain, No Nausea or Vomiting, bowel movements are regular, No Blood in stool or Urine, No dysuria, No new skin rashes or bruises,   No new weakness, tingling, numbness in any extremity, No recent weight gain or loss, No polyuria, polydypsia or polyphagia, No significant Mental Stressors.  All other systems reviewed and are negative.    Past History of the following :    Past Medical History:  Diagnosis Date   Allergic rhinitis    Anxiety    CAD (coronary artery disease)    a. 07/2007 Cath/PCI: LM nl, LAD 99p (3.0 x 18 Xience EES), D1 90ost, LCX nl, RCA nl;  b. 09/2014 MV: EF 72%, attenuation artifact, no ischemia.   Clotting disorder (HCC)    LEGS;throat   Falls    History of subarachnoid hemorrhage    a. 04/2016 In setting of fall-->traumatic brain injury.   HLD (hyperlipidemia)    Hypertension    Hypothyroidism  Incontinence    Left Forearm AV Fistula    a. Asymptomatic.  No blood draws/IV's;  b. 06/2013 confirmed by L FA duplex.   Mitral regurgitation    a. 02/2011 Echo: EF 72%, trace TR, mild MR/AI; b. 01/2018 Echo: EF 60-65%, no rwma, Gr2 DD, mild AI/MR. Nl RV fxn. Mildly dil RV/RA. Mod TR. Nl PASP.   Obstructive sleep apnea    Panic attacks    Traumatic brain injury (HCC)    a. 04/2016 in setting of fall/SAH.      Past Surgical History:  Procedure Laterality Date   BUNIONETTE EXCISION     CARDIAC CATHETERIZATION  07/2007   99% mid LAD stenosis   CORONARY STENT PLACEMENT  07/2007   Mid LAD: 3.0 X 18 mm Xience stent   MULTIPLE TOOTH EXTRACTIONS     THYROIDECTOMY     TOTAL KNEE ARTHROPLASTY     left      Social History:      Social History   Tobacco Use   Smoking status: Former Smoker    Packs/day: 0.10    Years: 2.00    Pack years: 0.20      Types: Cigarettes    Quit date: 10/04/1991    Years since quitting: 27.9   Smokeless tobacco: Never Used   Tobacco comment: smoked on and off  Substance Use Topics   Alcohol use: No       Family History :     Family History  Problem Relation Age of Onset   Heart disease Mother    Hyperlipidemia Mother    Tuberculosis Mother    Colon cancer Father    Colon cancer Other    Lymphoma Other    Heart disease Other    Breast cancer Neg Hx        Home Medications:   Prior to Admission medications   Medication Sig Start Date End Date Taking? Authorizing Provider  aspirin 81 MG tablet Take 81 mg by mouth daily.     [provider]  atorvastatin (LIPITOR) 40 MG tablet TAKE ONE TABLET BY MOUTH EVERY DAY Patient taking differently: Take 40 mg by mouth daily.  12/14/18   Iran Ouch, MD  Azelastine HCl 137 MCG/SPRAY SOLN Place 1 spray into both nostrils 2 (two) times daily.    [provider]  Cholecalciferol (VITAMIN D3) 1000 units CAPS Take 6,000 Units by mouth daily.     [provider]  denosumab (PROLIA) 60 MG/ML SOLN injection Inject 60 mg into the skin every 6 (six) months. Administer in upper arm, thigh, or abdomen    [provider]  divalproex (DEPAKOTE ER) 250 MG 24 hr tablet Take 1 tablet (250 mg total) by mouth every morning. 01/03/19   Mariel Craft, MD  donepezil (ARICEPT) 10 MG tablet Take 10 mg by mouth every morning.  09/02/16 01/17/19  [provider]  ENSURE (ENSURE) Take 237 mLs by mouth 2 (two) times daily between meals.    [provider]  ferrous sulfate 325 (65 FE) MG tablet Take 325 mg by mouth daily with breakfast.    [provider]  levothyroxine (SYNTHROID, LEVOTHROID) 75 MCG tablet Take 75 mcg by mouth daily before breakfast.  01/08/16   [provider]  QUEtiapine (SEROQUEL) 25 MG tablet Take 1 tablet (25 mg total) by mouth 2 (two) times daily. In afternoon and at  night Patient taking differently: Take 50 mg by mouth at bedtime.  01/03/19  Mariel Craft, MD  venlafaxine [facility]) 75 MG tablet Take 75 mg by mouth 2 (two) times daily.    [provider]  venlafaxine XR (EFFEXOR-XR) 75 MG 24 hr capsule Take 1 capsule (75 mg total) by mouth daily with breakfast. 01/04/19   Mariel Craft, MD     Allergies:     Allergies  Allergen Reactions   Azithromycin Anaphylaxis   Mobic [Meloxicam] Swelling   Ondansetron Hcl Other (See Comments) and Swelling    BLOOD CLOT;caused cardiac arrest BLOOD CLOT;caused cardiac arrest   Penicillins Shortness Of Breath    Has patient had a PCN reaction causing immediate rash, facial/tongue/throat swelling, SOB or lightheadedness with hypotension: Yes Has patient had a PCN reaction causing severe rash involving mucus membranes or skin necrosis: No Has patient had a PCN reaction that required hospitalization: No Has patient had a PCN reaction occurring within the last 10 years: No If all of the above answers are "NO", then may proceed with Cephalosporin use.    Percocet [Oxycodone-Acetaminophen] Anaphylaxis   Reclast [Zoledronic Acid] Swelling   Vicodin [Hydrocodone-Acetaminophen] Anaphylaxis   Codeine Other (See Comments)    Went into cardiac arrest at [facility] with this after surgery   Ibandronic Acid    Oxycodone Swelling    Swelling of tongue     Physical Exam:   Vitals  Blood pressure 118/61, pulse 77, temperature 98.2 F (36.8 C), temperature source Axillary, resp. rate 18, height  (1.6 m), weight 45 kg, SpO2 100 %.  1.  General: axoxo1 (person)  2. Psychiatric: euthymic  3. Neurologic: cn2-12 intact, reflexes 2+ symmetric, diffuse with no clonus, motor 5/5 in all 4 ext  4. HEENMT:  Anicteric, pupils 1.51mm symmetric, direct, consensual, intact Neck: in C collar  5. Respiratory : CTAB  6. Cardiovascular : rrr s1, s2,   7. Gastrointestinal:  Abd: soft, nt, nd, +bs  8.  Skin:  Ext: no c/c/e,  No rash  9.Musculoskeletal:  Good ROM    Data Review:    CBC Recent Labs  Lab 09/03/19 1641  WBC 8.4  HGB 11.9*  HCT 36.4  PLT 279  MCV 88.8  MCH 29.0  MCHC 32.7  RDW 13.1  LYMPHSABS 1.5  MONOABS 0.9  EOSABS 0.1  BASOSABS 0.1   ------------------------------------------------------------------------------------------------------------------  Results for orders placed or performed during the hospital encounter of 09/03/19 (from the past 48 hour(s))  Comprehensive metabolic panel     Status: Abnormal   Collection Time: 09/03/19  4:41 PM  Result Value Ref Range   Sodium 144 135 - 145 mmol/L   Potassium 3.3 (L) 3.5 - 5.1 mmol/L   Chloride 101 98 - 111 mmol/L   CO2 33 (H) 22 - 32 mmol/L   Glucose, Bld 109 (H) 70 - 99 mg/dL   BUN 17 8 - 23 mg/dL   Creatinine, Ser 1.61 0.44 - 1.00 mg/dL   Calcium 8.6 (L) 8.9 - 10.3 mg/dL   Total Protein 7.2 6.5 - 8.1 g/dL   Albumin 3.5 3.5 - 5.0 g/dL   AST 33 15 - 41 U/L   ALT 15 0 - 44 U/L   Alkaline Phosphatase 82 38 - 126 U/L   Total Bilirubin 0.7 0.3 - 1.2 mg/dL   GFR calc non Af Amer >60 >60 mL/min   GFR calc Af Amer >60 >60 mL/min   Anion gap 10 5 - 15    Comment: Performed at [facility], [address], [place], [place]  [address]  CBC with Differential     Status: Abnormal   Collection Time: 09/03/19  4:41 PM  Result Value Ref Range   WBC 8.4 4.0 - 10.5 K/uL   RBC 4.10 3.87 - 5.11 MIL/uL   Hemoglobin 11.9 (L) 12.0 - 15.0 g/dL   HCT 09.8 11.9 - 14.7 %   MCV 88.8 80.0 - 100.0 fL   MCH 29.0 26.0 - 34.0 pg   MCHC 32.7 30.0 - 36.0 g/dL   RDW 82.9 56.2 - 13.0 %   Platelets 279 150 - 400 K/uL   nRBC 0.0 0.0 - 0.2 %   Neutrophils Relative % 68 %   Neutro Abs 5.7 1.7 - 7.7 K/uL   Lymphocytes Relative 17 %   Lymphs Abs 1.5 0.7 - 4.0 K/uL   Monocytes Relative 11 %   Monocytes Absolute 0.9 0.1 - 1.0 K/uL   Eosinophils Relative 2 %   Eosinophils Absolute 0.1 0.0 - 0.5 K/uL   Basophils  Relative 1 %   Basophils Absolute 0.1 0.0 - 0.1 K/uL   Immature Granulocytes 1 %   Abs Immature Granulocytes 0.04 0.00 - 0.07 K/uL    Comment: Performed at [facility], [address] [place], [place] [address]  Urinalysis, Complete w Microscopic     Status: Abnormal   Collection Time: 09/03/19  4:41 PM  Result Value Ref Range   Color, Urine AMBER (A) YELLOW    Comment: BIOCHEMICALS MAY BE AFFECTED BY COLOR   APPearance CLOUDY (A) CLEAR   Specific Gravity, Urine 1.026 1.005 - 1.030   pH 5.0 5.0 - 8.0   Glucose, UA NEGATIVE NEGATIVE mg/dL   Hgb urine dipstick NEGATIVE NEGATIVE   Bilirubin Urine NEGATIVE NEGATIVE   Ketones, ur NEGATIVE NEGATIVE mg/dL   Protein, ur NEGATIVE NEGATIVE mg/dL   Nitrite POSITIVE (A) NEGATIVE   Leukocytes,Ua SMALL (A) NEGATIVE   RBC / HPF 0-5 0 - 5 RBC/hpf   WBC, UA 21-50 0 - 5 WBC/hpf   Bacteria, UA MANY (A) NONE SEEN   Squamous Epithelial / LPF 0-5 0 - 5   WBC Clumps PRESENT    Mucus PRESENT     Comment: Performed at [facility], [address] [place], [place] [address]  Type and screen [facility]     Status: None   Collection Time: 09/03/19  4:41 PM  Result Value Ref Range   ABO/RH(D) O POS    Antibody Screen NEG    Sample Expiration      09/06/2019,2359 Performed at [facility], [address] [place], [place] [address]   Protime-INR     Status: None   Collection Time: 09/03/19  4:41 PM  Result Value Ref Range   Prothrombin Time 13.7 11.4 - 15.2 seconds   INR 1.1 0.8 - 1.2    Comment: (NOTE) INR goal varies based on device and disease states. Performed at [facility], [address] [place], [place] [address]   Troponin I (High Sensitivity)     Status: Abnormal   Collection Time: 09/03/19  4:41 PM  Result Value Ref Range   Troponin I (High Sensitivity) 43 (H) <18 ng/L    Comment: (NOTE) Elevated high sensitivity troponin I (hsTnI) values and significant  changes  across serial measurements may suggest ACS but many other  chronic and acute conditions are known to elevate hsTnI results.  Refer to the "Links" section for chest pain algorithms and additional  guidance. Performed at  [facility], [place], [place] [address]   Troponin I (High Sensitivity)     Status: Abnormal   Collection Time: 09/03/19  6:50 PM  Result Value Ref Range   Troponin I (High Sensitivity) 39 (H) <18 ng/L    Comment: (NOTE) Elevated high sensitivity troponin I (hsTnI) values and significant  changes across serial measurements may suggest ACS but many other  chronic and acute conditions are known to elevate hsTnI results.  Refer to the "Links" section for chest pain algorithms and additional  guidance. Performed at [facility], [address] [place], [place] [address]     Chemistries  Recent Labs  Lab 09/03/19 1641  NA 144  K 3.3*  CL 101  CO2 33*  GLUCOSE 109*  BUN 17  CREATININE 0.57  CALCIUM 8.6*  AST 33  ALT 15  ALKPHOS 82  BILITOT 0.7   ------------------------------------------------------------------------------------------------------------------  ------------------------------------------------------------------------------------------------------------------ GFR: Estimated Creatinine Clearance: 39.8 mL/min (by C-G formula based on SCr of 0.57 mg/dL). Liver Function Tests: Recent Labs  Lab 09/03/19 1641  AST 33  ALT 15  ALKPHOS 82  BILITOT 0.7  PROT 7.2  ALBUMIN 3.5   No results for input(s): LIPASE, AMYLASE in the last 168 hours. No results for input(s): AMMONIA in the last 168 hours. Coagulation Profile: Recent Labs  Lab 09/03/19 1641  INR 1.1   Cardiac Enzymes: No results for input(s): CKTOTAL, CKMB, CKMBINDEX, TROPONINI in the last 168 hours. BNP (last 3 results) No results for input(s): PROBNP in the last 8760 hours. HbA1C: No results for input(s): HGBA1C in the last 72  hours. CBG: No results for input(s): GLUCAP in the last 168 hours. Lipid Profile: No results for input(s): CHOL, HDL, LDLCALC, TRIG, CHOLHDL, LDLDIRECT in the last 72 hours. Thyroid Function Tests: No results for input(s): TSH, T4TOTAL, FREET4, T3FREE, THYROIDAB in the last 72 hours. Anemia Panel: No results for input(s): VITAMINB12, FOLATE, FERRITIN, TIBC, IRON, RETICCTPCT in the last 72 hours.  --------------------------------------------------------------------------------------------------------------- Urine analysis:    Component Value Date/Time   COLORURINE AMBER (A) 09/03/2019 1641   APPEARANCEUR CLOUDY (A) 09/03/2019 1641   APPEARANCEUR Clear 09/30/2016 0934   LABSPEC 1.026 09/03/2019 1641   PHURINE 5.0 09/03/2019 1641   GLUCOSEU NEGATIVE 09/03/2019 1641   HGBUR NEGATIVE 09/03/2019 1641   BILIRUBINUR NEGATIVE 09/03/2019 1641   BILIRUBINUR Negative 09/30/2016 0934   KETONESUR NEGATIVE 09/03/2019 1641   PROTEINUR NEGATIVE 09/03/2019 1641   NITRITE POSITIVE (A) 09/03/2019 1641   LEUKOCYTESUR SMALL (A) 09/03/2019 1641      Imaging Results:    Dg Chest 1 View  Result Date: 09/03/2019 CLINICAL DATA:  Head trauma secondary to a fall. COVID-19. EXAM: CHEST  1 VIEW COMPARISON:  01/17/2019 FINDINGS: The heart size and pulmonary vascularity are normal. Aortic atherosclerosis. Multiple old healed right rib fractures. The right humeral head appears superiorly subluxed or dislocated. This may be projectional rather than a true dislocation or subluxation. No other acute bone abnormality. IMPRESSION: 1. No acute cardiopulmonary abnormalities. 2.  Aortic Atherosclerosis (ICD10-I70.0). 3. Possible superior subluxation or dislocation of the right humeral head. This may be projectional. Electronically Signed   By: Lorriane Shire M.D.   On: 09/03/2019 17:45   Dg Shoulder 1v Right  Result Date: 09/03/2019 CLINICAL DATA:  Right shoulder subluxation after a fall. EXAM: RIGHT SHOULDER - 1 VIEW  8:35 p.m. COMPARISON:  Radiographs dated 09/03/2019 6:31 p.m. FINDINGS: There is superior subluxation of the humeral head with respect of the glenoid  with articulation with the undersurface of the acromion, likely due to a chronic full-thickness rotator cuff tear. No appreciable dislocation at this time. This appears appreciably different than on the chest x-ray of 09/03/2019 at 5:25 p.m. IMPRESSION: 1. Chronic full-thickness rotator cuff tear with superior subluxation of the humeral head with respect of the glenoid. 2. No discrete dislocation. Electronically Signed   By: Francene Boyers M.D.   On: 09/03/2019 20:52   Dg Shoulder Right  Result Date: 09/03/2019 CLINICAL DATA:  Right shoulder subluxation or dislocation EXAM: RIGHT SHOULDER - 2+ VIEW COMPARISON:  None. FINDINGS: The humeral head appears to be anterior and superiorly subluxed in relation to the glenoid surface. No definite fracture seen. There is diffuse osteopenia. AC joint arthrosis is noted. IMPRESSION: Anterior superior subluxation of the humeral head. No definite fracture. Electronically Signed   By: Jonna Clark M.D.   On: 09/03/2019 18:56   Ct Head Wo Contrast  Result Date: 09/03/2019 CLINICAL DATA:  78 year old female with head trauma. EXAM: CT HEAD WITHOUT CONTRAST CT MAXILLOFACIAL WITHOUT CONTRAST CT CERVICAL SPINE WITHOUT CONTRAST TECHNIQUE: Multidetector CT imaging of the head, cervical spine, and maxillofacial structures were performed using the standard protocol without intravenous contrast. Multiplanar CT image reconstructions of the cervical spine and maxillofacial structures were also generated. COMPARISON:  Head CT dated 04/17/2017. FINDINGS: CT HEAD FINDINGS Brain: There is mild age-related atrophy and chronic microvascular ischemic changes. There is no acute intracranial hemorrhage. No mass effect or midline shift. No extra-axial fluid collection. Vascular: No hyperdense vessel or unexpected calcification. Skull: Normal.  Negative for fracture or focal lesion. Other: Mild right posterior scalp contusion. CT MAXILLOFACIAL FINDINGS Osseous: No acute fracture. No mandibular subluxation. Bilateral TMJ disease. Orbits: Negative. No traumatic or inflammatory finding. Sinuses: Clear. Soft tissues: Negative. CT CERVICAL SPINE FINDINGS Alignment: No acute subluxation. Grade 1 C4-C5 anterolisthesis. Skull base and vertebrae: There is nondisplaced fracture of the anterior ring of C1. No other acute fracture identified. The bones are osteopenic. Soft tissues and spinal canal: No prevertebral fluid or swelling. No visible canal hematoma. Disc levels: Multilevel degenerative changes with endplate irregularity and disc space narrowing most prominent at C4-C5, C5-C6, and C6-C7. Upper chest: A 4 mm right upper lobe subpleural nodule. Other: Bilateral carotid bulb calcified plaques. IMPRESSION: 1. No acute intracranial hemorrhage. Mild age-related atrophy and chronic microvascular ischemic changes. 2. Nondisplaced fracture of the anterior ring of C1. 3. No acute facial bone fractures. These results were called by telephone at the time of interpretation on 09/03/2019 at 5:35 pm to provider [facility] , who verbally acknowledged these results. Electronically Signed   By: Elgie Collard M.D.   On: 09/03/2019 17:49   Ct Cervical Spine Wo Contrast  Result Date: 09/03/2019 CLINICAL DATA:  78 year old female with head trauma. EXAM: CT HEAD WITHOUT CONTRAST CT MAXILLOFACIAL WITHOUT CONTRAST CT CERVICAL SPINE WITHOUT CONTRAST TECHNIQUE: Multidetector CT imaging of the head, cervical spine, and maxillofacial structures were performed using the standard protocol without intravenous contrast. Multiplanar CT image reconstructions of the cervical spine and maxillofacial structures were also generated. COMPARISON:  Head CT dated 04/17/2017. FINDINGS: CT HEAD FINDINGS Brain: There is mild age-related atrophy and chronic microvascular ischemic changes. There is no  acute intracranial hemorrhage. No mass effect or midline shift. No extra-axial fluid collection. Vascular: No hyperdense vessel or unexpected calcification. Skull: Normal. Negative for fracture or focal lesion. Other: Mild right posterior scalp contusion. CT MAXILLOFACIAL FINDINGS Osseous: No acute fracture. No mandibular subluxation. Bilateral TMJ disease. Orbits: Negative.  No traumatic or inflammatory finding. Sinuses: Clear. Soft tissues: Negative. CT CERVICAL SPINE FINDINGS Alignment: No acute subluxation. Grade 1 C4-C5 anterolisthesis. Skull base and vertebrae: There is nondisplaced fracture of the anterior ring of C1. No other acute fracture identified. The bones are osteopenic. Soft tissues and spinal canal: No prevertebral fluid or swelling. No visible canal hematoma. Disc levels: Multilevel degenerative changes with endplate irregularity and disc space narrowing most prominent at C4-C5, C5-C6, and C6-C7. Upper chest: A 4 mm right upper lobe subpleural nodule. Other: Bilateral carotid bulb calcified plaques. IMPRESSION: 1. No acute intracranial hemorrhage. Mild age-related atrophy and chronic microvascular ischemic changes. 2. Nondisplaced fracture of the anterior ring of C1. 3. No acute facial bone fractures. These results were called by telephone at the time of interpretation on 09/03/2019 at 5:35 pm to provider [facility] , who verbally acknowledged these results. Electronically Signed   By: Elgie Collard M.D.   On: 09/03/2019 17:49   Ct Maxillofacial Wo Contrast  Result Date: 09/03/2019 CLINICAL DATA:  78 year old female with head trauma. EXAM: CT HEAD WITHOUT CONTRAST CT MAXILLOFACIAL WITHOUT CONTRAST CT CERVICAL SPINE WITHOUT CONTRAST TECHNIQUE: Multidetector CT imaging of the head, cervical spine, and maxillofacial structures were performed using the standard protocol without intravenous contrast. Multiplanar CT image reconstructions of the cervical spine and maxillofacial structures were also  generated. COMPARISON:  Head CT dated 04/17/2017. FINDINGS: CT HEAD FINDINGS Brain: There is mild age-related atrophy and chronic microvascular ischemic changes. There is no acute intracranial hemorrhage. No mass effect or midline shift. No extra-axial fluid collection. Vascular: No hyperdense vessel or unexpected calcification. Skull: Normal. Negative for fracture or focal lesion. Other: Mild right posterior scalp contusion. CT MAXILLOFACIAL FINDINGS Osseous: No acute fracture. No mandibular subluxation. Bilateral TMJ disease. Orbits: Negative. No traumatic or inflammatory finding. Sinuses: Clear. Soft tissues: Negative. CT CERVICAL SPINE FINDINGS Alignment: No acute subluxation. Grade 1 C4-C5 anterolisthesis. Skull base and vertebrae: There is nondisplaced fracture of the anterior ring of C1. No other acute fracture identified. The bones are osteopenic. Soft tissues and spinal canal: No prevertebral fluid or swelling. No visible canal hematoma. Disc levels: Multilevel degenerative changes with endplate irregularity and disc space narrowing most prominent at C4-C5, C5-C6, and C6-C7. Upper chest: A 4 mm right upper lobe subpleural nodule. Other: Bilateral carotid bulb calcified plaques. IMPRESSION: 1. No acute intracranial hemorrhage. Mild age-related atrophy and chronic microvascular ischemic changes. 2. Nondisplaced fracture of the anterior ring of C1. 3. No acute facial bone fractures. These results were called by telephone at the time of interpretation on 09/03/2019 at 5:35 pm to provider [facility] , who verbally acknowledged these results. Electronically Signed   By: Elgie Collard M.D.   On: 09/03/2019 17:49       Assessment & Plan:    Principal Problem:   AMS (altered mental status) Active Problems:   Hypokalemia   Acute lower UTI   Elevated troponin  AMS Acute lower uti Urine culture  Levaquin iv pharmacy to dose  Elevated troponin  Check trop I Check cardiac echo  Consider cardiology  consult in am  Hypokalemia Replete Check cmp in am If still low in am consider checking magnesium  Anterior superior subluxation of the humeral head. No definite fracture. 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  Code Status:  DNR per daughter who is her POA  Admission status: Observation: Based on patients clinical presentation and evaluation of above clinical data, I have made determination that patient meets observatin criteria at this time.  Time spent in minutes : 70   Pearson GrippeJames Braycen Burandt M.D on 09/03/2019 at 9:36 PM

## 2019-09-03 NOTE — Progress Notes (Signed)
Orthopedic Tech Progress Note Patient Details:  Monica Downs 04/14/1941 [id number] Called [employer] for a [place] J Collar to be delivered tonight. Patient ID: Monica Downs, female   DOB: March 12, 1941, 78 y.o.   MRN: [id number]   [name] 09/03/2019, 7:20 PM

## 2019-09-03 NOTE — Consult Note (Signed)
Pharmacy Antibiotic Note  Monica Downs is a 78 y.o. female admitted on 09/03/2019 with AMS and acute lower UTI. Patient has reported PCN allergy of SOB. Pharmacy has been consulted for Levofloxacin dosing.   Plan: Patient received levofloxacin 500mg  IV x 1 dose in ED. Will start levofloxacin 250 mg IV every 24 hours.   Height: 5\' 3"  (160 cm) Weight: 99 lb 3.3 oz (45 kg) IBW/kg (Calculated) : 52.4  Temp (24hrs), Avg:98.2 F (36.8 C), Min:98.2 F (36.8 C), Max:98.2 F (36.8 C)  Recent Labs  Lab 09/03/19 1641  WBC 8.4  CREATININE 0.57    Estimated Creatinine Clearance: 39.8 mL/min (by C-G formula based on SCr of 0.57 mg/dL).    Allergies  Allergen Reactions  . Azithromycin Anaphylaxis  . Mobic [Meloxicam] Swelling  . Ondansetron Hcl Other (See Comments) and Swelling    BLOOD CLOT;caused cardiac arrest BLOOD CLOT;caused cardiac arrest  . Penicillins Shortness Of Breath    Has patient had a PCN reaction causing immediate rash, facial/tongue/throat swelling, SOB or lightheadedness with hypotension: Yes Has patient had a PCN reaction causing severe rash involving mucus membranes or skin necrosis: No Has patient had a PCN reaction that required hospitalization: No Has patient had a PCN reaction occurring within the last 10 years: No If all of the above answers are "NO", then may proceed with Cephalosporin use.   Marland Kitchen Percocet [Oxycodone-Acetaminophen] Anaphylaxis  . Reclast [Zoledronic Acid] Swelling  . Vicodin [Hydrocodone-Acetaminophen] Anaphylaxis  . Codeine Other (See Comments)    Went into cardiac arrest at [facility] with this after surgery  . Ibandronic Acid   . Oxycodone Swelling    Swelling of tongue    Antimicrobials this admission: 12/1 levofloxacin >>   Microbiology results: 12/1 UCx: pending   Thank you for allowing pharmacy to be a part of this patient's care.  Pernell Dupre, PharmD, BCPS Clinical Pharmacist 09/03/2019 11:06 PM

## 2019-09-03 NOTE — ED Notes (Addendum)
Daughter, Velmer Broadfoot requesting that she be called and updated on patient's disposition. Corrected cell phone number in demographics.

## 2019-09-03 NOTE — ED Triage Notes (Signed)
Pt to ER via [facility] from [facility] for unwitnessed fall. Pt is at baseline, nonverbal and nonambulatory per EMS. Pt with hx of dementia per EMS. VSS with EMS. Pt COVID positive X 2 weeks ago. Arrives in c collar. Laceration to right forehead.

## 2019-09-04 ENCOUNTER — Observation Stay
Admit: 2019-09-04 | Discharge: 2019-09-04 | Disposition: A | Discharge status: Home / Self Care | Payer: Medicare Other | Attending: Internal Medicine | Admitting: Internal Medicine

## 2019-09-04 DIAGNOSIS — I083 Combined rheumatic disorders of mitral, aortic and tricuspid valves: Secondary | ICD-10-CM | POA: Diagnosis present

## 2019-09-04 DIAGNOSIS — Z8619 Personal history of other infectious and parasitic diseases: Secondary | ICD-10-CM | POA: Diagnosis not present

## 2019-09-04 DIAGNOSIS — E785 Hyperlipidemia, unspecified: Secondary | ICD-10-CM | POA: Diagnosis present

## 2019-09-04 DIAGNOSIS — E890 Postprocedural hypothyroidism: Secondary | ICD-10-CM | POA: Diagnosis present

## 2019-09-04 DIAGNOSIS — R4182 Altered mental status, unspecified: Secondary | ICD-10-CM | POA: Diagnosis present

## 2019-09-04 DIAGNOSIS — I2510 Atherosclerotic heart disease of native coronary artery without angina pectoris: Secondary | ICD-10-CM | POA: Diagnosis present

## 2019-09-04 DIAGNOSIS — S12031D Nondisplaced posterior arch fracture of first cervical vertebra, subsequent encounter for fracture with routine healing: Secondary | ICD-10-CM | POA: Diagnosis not present

## 2019-09-04 DIAGNOSIS — F410 Panic disorder [episodic paroxysmal anxiety] without agoraphobia: Secondary | ICD-10-CM | POA: Diagnosis present

## 2019-09-04 DIAGNOSIS — I248 Other forms of acute ischemic heart disease: Secondary | ICD-10-CM | POA: Diagnosis present

## 2019-09-04 DIAGNOSIS — M75121 Complete rotator cuff tear or rupture of right shoulder, not specified as traumatic: Secondary | ICD-10-CM | POA: Diagnosis present

## 2019-09-04 DIAGNOSIS — W19XXXA Unspecified fall, initial encounter: Secondary | ICD-10-CM | POA: Diagnosis present

## 2019-09-04 DIAGNOSIS — N3001 Acute cystitis with hematuria: Secondary | ICD-10-CM | POA: Diagnosis present

## 2019-09-04 DIAGNOSIS — G9349 Other encephalopathy: Secondary | ICD-10-CM | POA: Diagnosis present

## 2019-09-04 DIAGNOSIS — G4733 Obstructive sleep apnea (adult) (pediatric): Secondary | ICD-10-CM | POA: Diagnosis present

## 2019-09-04 DIAGNOSIS — Z96652 Presence of left artificial knee joint: Secondary | ICD-10-CM | POA: Diagnosis present

## 2019-09-04 DIAGNOSIS — D649 Anemia, unspecified: Secondary | ICD-10-CM | POA: Diagnosis present

## 2019-09-04 DIAGNOSIS — Z66 Do not resuscitate: Secondary | ICD-10-CM | POA: Diagnosis present

## 2019-09-04 DIAGNOSIS — F0390 Unspecified dementia without behavioral disturbance: Secondary | ICD-10-CM | POA: Diagnosis present

## 2019-09-04 DIAGNOSIS — M810 Age-related osteoporosis without current pathological fracture: Secondary | ICD-10-CM | POA: Diagnosis present

## 2019-09-04 DIAGNOSIS — M24411 Recurrent dislocation, right shoulder: Secondary | ICD-10-CM | POA: Diagnosis present

## 2019-09-04 DIAGNOSIS — U071 COVID-19: Secondary | ICD-10-CM | POA: Diagnosis not present

## 2019-09-04 DIAGNOSIS — I10 Essential (primary) hypertension: Secondary | ICD-10-CM | POA: Diagnosis present

## 2019-09-04 DIAGNOSIS — Y92129 Unspecified place in nursing home as the place of occurrence of the external cause: Secondary | ICD-10-CM | POA: Diagnosis not present

## 2019-09-04 DIAGNOSIS — W19XXXD Unspecified fall, subsequent encounter: Secondary | ICD-10-CM | POA: Diagnosis present

## 2019-09-04 DIAGNOSIS — S0101XA Laceration without foreign body of scalp, initial encounter: Secondary | ICD-10-CM | POA: Diagnosis present

## 2019-09-04 DIAGNOSIS — E876 Hypokalemia: Secondary | ICD-10-CM | POA: Diagnosis present

## 2019-09-04 DIAGNOSIS — Z955 Presence of coronary angioplasty implant and graft: Secondary | ICD-10-CM | POA: Diagnosis not present

## 2019-09-04 DIAGNOSIS — I700 Atherosclerosis of aorta: Secondary | ICD-10-CM | POA: Diagnosis present

## 2019-09-04 DIAGNOSIS — I4519 Other right bundle-branch block: Secondary | ICD-10-CM | POA: Diagnosis present

## 2019-09-04 LAB — COMPREHENSIVE METABOLIC PANEL
ALT: 15 U/L (ref 0–44)
AST: 30 U/L (ref 15–41)
Albumin: 3.3 g/dL — ABNORMAL LOW (ref 3.5–5.0)
Alkaline Phosphatase: 81 U/L (ref 38–126)
Anion gap: 10 (ref 5–15)
BUN: 14 mg/dL (ref 8–23)
CO2: 28 mmol/L (ref 22–32)
Calcium: 7.7 mg/dL — ABNORMAL LOW (ref 8.9–10.3)
Chloride: 105 mmol/L (ref 98–111)
Creatinine, Ser: 0.54 mg/dL (ref 0.44–1.00)
GFR calc Af Amer: >60 mL/min (ref >60)
GFR calc non Af Amer: >60 mL/min (ref >60)
Glucose, Bld: 86 mg/dL (ref 70–99)
Potassium: 3.2 mmol/L — ABNORMAL LOW (ref 3.5–5.1)
Sodium: 143 mmol/L (ref 135–145)
Total Bilirubin: 0.8 mg/dL (ref 0.3–1.2)
Total Protein: 6.7 g/dL (ref 6.5–8.1)

## 2019-09-04 LAB — CBC
HCT: 34.4 % — ABNORMAL LOW (ref 36.0–46.0)
Hemoglobin: 11.1 g/dL — ABNORMAL LOW (ref 12.0–15.0)
MCH: 28.9 pg (ref 26.0–34.0)
MCHC: 32.3 g/dL (ref 30.0–36.0)
MCV: 89.6 fL (ref 80.0–100.0)
Platelets: 258 10*3/uL (ref 150–400)
RBC: 3.84 MIL/uL — ABNORMAL LOW (ref 3.87–5.11)
RDW: 13.2 % (ref 11.5–15.5)
WBC: 8.6 10*3/uL (ref 4.0–10.5)
nRBC: 0 % (ref 0.0–0.2)

## 2019-09-04 LAB — ECHOCARDIOGRAM COMPLETE
Height: 63 in
Weight: 1587.31 oz

## 2019-09-04 LAB — TROPONIN I (HIGH SENSITIVITY): Troponin I (High Sensitivity): 47 ng/L — ABNORMAL HIGH (ref <18)

## 2019-09-04 LAB — SARS CORONAVIRUS 2 (TAT 6-24 HRS): SARS Coronavirus 2: NEGATIVE

## 2019-09-04 MED ORDER — FERROUS SULFATE 325 (65 FE) MG PO TABS
325.0000 mg | ORAL_TABLET | Freq: Every day | ORAL | Status: DC
  Administered 2019-09-05: 325 mg via ORAL
  Filled 2019-09-04: qty 1

## 2019-09-04 MED ORDER — DIAZEPAM 5 MG/ML IJ SOLN
2.0000 mg | Freq: Once | INTRAMUSCULAR | Status: AC
  Administered 2019-09-04: 18:00:00 2 mg via INTRAVENOUS

## 2019-09-04 MED ORDER — ENSURE ENLIVE PO LIQD
237.0000 mL | Freq: Two times a day (BID) | ORAL | Status: DC
  Administered 2019-09-05: 237 mL via ORAL

## 2019-09-04 MED ORDER — AZELASTINE HCL 0.1 % NA SOLN
1.0000 | Freq: Two times a day (BID) | NASAL | Status: DC
  Administered 2019-09-04 – 2019-09-05 (×2): 1 via NASAL
  Filled 2019-09-04 (×2): qty 30

## 2019-09-04 MED ORDER — LEVOTHYROXINE SODIUM 75 MCG PO TABS: 75.0000 ug | ORAL_TABLET | Freq: Every day | ORAL | Status: DC

## 2019-09-04 MED ORDER — POTASSIUM CHLORIDE 10 MEQ/100ML IV SOLN
10.0000 meq | INTRAVENOUS | Status: AC
  Administered 2019-09-04 (×3): 10 meq via INTRAVENOUS
  Filled 2019-09-04 (×4): qty 100

## 2019-09-04 MED ORDER — DENOSUMAB 60 MG/ML ~~LOC~~ SOLN: 60.0000 mg | SUBCUTANEOUS | Status: DC

## 2019-09-04 MED ORDER — ASPIRIN EC 81 MG PO TBEC
81.0000 mg | DELAYED_RELEASE_TABLET | Freq: Every day | ORAL | Status: DC
  Administered 2019-09-04: 81 mg via ORAL
  Filled 2019-09-04: qty 1

## 2019-09-04 MED ORDER — ATORVASTATIN CALCIUM 20 MG PO TABS
40.0000 mg | ORAL_TABLET | Freq: Every day | ORAL | Status: DC
  Administered 2019-09-04 – 2019-09-05 (×2): 40 mg via ORAL
  Filled 2019-09-04 (×2): qty 2

## 2019-09-04 MED ORDER — POTASSIUM CHLORIDE CRYS ER 20 MEQ PO TBCR
40.0000 meq | EXTENDED_RELEASE_TABLET | Freq: Once | ORAL | Status: DC
  Filled 2019-09-04: qty 2

## 2019-09-04 MED ORDER — VITAMIN D3 25 MCG (1000 UNIT) PO TABS
6000.0000 [IU] | ORAL_TABLET | Freq: Every day | ORAL | Status: DC
  Administered 2019-09-05: 6000 [IU] via ORAL
  Filled 2019-09-04: qty 6

## 2019-09-04 MED ORDER — VENLAFAXINE HCL 37.5 MG PO TABS
75.0000 mg | ORAL_TABLET | Freq: Two times a day (BID) | ORAL | Status: DC
  Administered 2019-09-04 – 2019-09-05 (×2): 75 mg via ORAL
  Filled 2019-09-04 (×4): qty 2

## 2019-09-04 MED ORDER — DIVALPROEX SODIUM ER 250 MG PO TB24
250.0000 mg | ORAL_TABLET | ORAL | Status: DC
  Administered 2019-09-05: 250 mg via ORAL
  Filled 2019-09-04 (×2): qty 1

## 2019-09-04 MED ORDER — DONEPEZIL HCL 5 MG PO TABS
10.0000 mg | ORAL_TABLET | ORAL | Status: DC
  Administered 2019-09-05: 10 mg via ORAL
  Filled 2019-09-04 (×2): qty 2

## 2019-09-04 MED ORDER — SODIUM CHLORIDE 0.9 % IV SOLN
INTRAVENOUS | Status: DC
  Administered 2019-09-04 – 2019-09-05 (×2): via INTRAVENOUS

## 2019-09-04 MED ORDER — LEVOTHYROXINE SODIUM 50 MCG PO TABS
75.0000 ug | ORAL_TABLET | Freq: Every day | ORAL | Status: DC
  Administered 2019-09-05: 75 ug via ORAL
  Filled 2019-09-04: qty 1
  Filled 2019-09-04: qty 2

## 2019-09-04 MED ORDER — DIAZEPAM 5 MG/ML IJ SOLN
INTRAMUSCULAR | Status: AC
  Administered 2019-09-04: 2 mg via INTRAVENOUS
  Filled 2019-09-04: qty 2

## 2019-09-04 MED ORDER — LEVOFLOXACIN 250 MG PO TABS
250.0000 mg | ORAL_TABLET | Freq: Every day | ORAL | Status: DC
  Filled 2019-09-04: qty 1

## 2019-09-04 MED ORDER — QUETIAPINE FUMARATE 25 MG PO TABS
50.0000 mg | ORAL_TABLET | Freq: Every day | ORAL | Status: DC
  Administered 2019-09-04: 50 mg via ORAL
  Filled 2019-09-04 (×2): qty 2

## 2019-09-04 MED ORDER — LEVOFLOXACIN IN D5W 250 MG/50ML IV SOLN
250.0000 mg | INTRAVENOUS | Status: DC
  Administered 2019-09-04: 250 mg via INTRAVENOUS
  Filled 2019-09-04 (×2): qty 50

## 2019-09-04 NOTE — ED Notes (Signed)
PT at bedside.

## 2019-09-04 NOTE — Evaluation (Signed)
Physical Therapy Evaluation Patient Details Name: Monica Downs MRN: [id number] DOB: December 17, 1940 Today's Date: 09/04/2019   History of Present Illness  presented to ER secondary to unwitnessed fall; admitted for management of AMS, UTI.  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  Clinical Impression  Patient resting with eyes closed upon arrival to session, but awakens to voice and light touch.  Intermittently answers yes/no questions, but speech largely garbled and incomprehensible.  R shoulder deformity visible (see chart for details); ROM, strength testing and functional use deferred pending clarification of restrictions from physician (Hooten).  L UE, bilat LEs otherwise grossly [facility] for basic transfers and gait.  Currently requiring mod/max assist for bed mobility; min/mod assist for sit/stand, static standing balance and short-distances stepping (forward, backward, laterally) with L HHA.  Demonstrates short, choppy steps wiht poor foot clearance, step height/length; poor balance.  Constant manual cuing for weight shifting, movement initiation and overall balance/safety.  Very high risk for recurrent falls. Would benefit from skilled PT to address above deficits and promote optimal return to PLOF.; recommend transition to STR upon discharge from acute hospitalization.     Follow Up Recommendations SNF    Equipment Recommendations       Recommendations for Other Services       Precautions / Restrictions Precautions Precautions: Fall Required Braces or Orthoses: Cervical Brace Restrictions Weight Bearing Restrictions: No Other Position/Activity Restrictions: R UE maintained NWB throughout until clarified with physician (Hooten)      Mobility  Bed Mobility Overal bed mobility: Needs Assistance Bed Mobility: Supine to Sit;Sit to Supine     Supine to sit: Mod  assist Sit to supine: Mod assist;Max assist   General bed mobility comments: consistent assist for movement facilitation, limited self-initiation of functional task  Transfers Overall transfer level: Needs assistance Equipment used: 1 person hand held assist Transfers: Sit to/from Stand Sit to Stand: Mod assist;Min assist         General transfer comment: assist for lift off, stabilization with L HHA; excessive posterior weight shift/lean with posterior surface of bilat LEs against edge of bed at times  Ambulation/Gait Ambulation/Gait assistance: Min assist;Mod assist Gait Distance (Feet): 3 Feet Assistive device: 1 person hand held assist       General Gait Details: short, choppy steps wiht poor foot clearance, step height/length; poor balance.  Constant manual cuing for weight shifting, movement initiation and overall balance/safety.  Stairs            Wheelchair Mobility    Modified Rankin (Stroke Patients Only)       Balance Overall balance assessment: Needs assistance Sitting-balance support: No upper extremity supported;Feet supported Sitting balance-Leahy Scale: Fair     Standing balance support: Single extremity supported Standing balance-Leahy Scale: Poor                               Pertinent Vitals/Pain Pain Assessment: Faces Faces Pain Scale: Hurts little more Pain Location: general soreness Pain Descriptors / Indicators: Sore Pain Intervention(s): Limited activity within patient's tolerance;Monitored during session;Repositioned    Home Living Family/patient expects to be discharged to:: Private residence                 Additional Comments: Patient poor historian; unable to provide accurate history    Prior Function  Comments: Patient poor historian; unable to provide accurate history     Hand Dominance        Extremity/Trunk Assessment   Upper Extremity Assessment Upper Extremity Assessment: (R UE  not tested at this time; L UE WFL)    Lower Extremity Assessment Lower Extremity Assessment: (grossly at least 4-/5 throughout)       Communication      Cognition Arousal/Alertness: Lethargic Behavior During Therapy: Flat affect Overall Cognitive Status: No family/caregiver present to determine baseline cognitive functioning                                 General Comments: oriented to self; impaired communication, limited command-following, often requiring hand-over-hand assist to initiate movement      General Comments      Exercises Other Exercises Other Exercises: Sit/stand x2 with L HHA, min/mod assist, emphasis on mechanics and standing balance.  Requires hands-on assist at all times. Other Exercises: Forward, backward and lateral stepping edge of bed with L HHA, min/mod assist; constant facilitation for weight shift and task initiation.  Poor balance.   Assessment/Plan    PT Assessment Patient needs continued PT services  PT Problem List Decreased strength;Decreased range of motion;Decreased activity tolerance;Decreased balance;Decreased mobility;Decreased coordination;Decreased cognition;Decreased knowledge of use of DME;Decreased safety awareness;Decreased knowledge of precautions;Pain       PT Treatment Interventions DME instruction;Gait training;Stair training;Functional mobility training;Therapeutic activities;Therapeutic exercise;Balance training;Neuromuscular re-education;Cognitive remediation;Patient/family education    PT Goals (Current goals can be found in the Care Plan section)  Acute Rehab PT Goals PT Goal Formulation: Patient unable to participate in goal setting Time For Goal Achievement: 09/18/19 Potential to Achieve Goals: Fair    Frequency Min 2X/week   Barriers to discharge Decreased caregiver support      Co-evaluation               AM-PAC PT "6 Clicks" Mobility  Outcome Measure Help needed turning from your back to  your side while in a flat bed without using bedrails?: A Lot Help needed moving from lying on your back to sitting on the side of a flat bed without using bedrails?: A Lot Help needed moving to and from a bed to a chair (including a wheelchair)?: A Little Help needed standing up from a chair using your arms (e.g., wheelchair or bedside chair)?: A Lot Help needed to walk in hospital room?: A Lot Help needed climbing 3-5 steps with a railing? : A Lot 6 Click Score: 13    End of Session   Activity Tolerance: Patient limited by fatigue Patient left: in bed;with call bell/phone within reach Nurse Communication: Mobility status PT Visit Diagnosis: Difficulty in walking, not elsewhere classified (R26.2);Muscle weakness (generalized) (M62.81)    Time: 3267-1245 PT Time Calculation (min) (ACUTE ONLY): 21 min   Charges:   PT Evaluation $PT Eval Moderate Complexity: 1 Mod PT Treatments $Therapeutic Activity: 8-22 mins        Jensen Cheramie H. Manson Passey, PT, DPT, NCS 09/04/19, 12:27 PM [phone]

## 2019-09-04 NOTE — Progress Notes (Signed)
*  PRELIMINARY RESULTS* Echocardiogram 2D Echocardiogram has been performed.  Sherrie Sport 09/04/2019, 10:26 AM

## 2019-09-04 NOTE — Consult Note (Addendum)
Pharmacy Antibiotic Note  Monica Downs is a 78 y.o. female admitted on 09/03/2019 with AMS and acute lower UTI. Patient has reported PCN allergy of SOB. Pharmacy has been consulted for Levofloxacin dosing.   Plan: Patient received levofloxacin 500mg  IV x 1 dose in ED. Will start levofloxacin 250 mg IV every 24 hours.   Height: 5\' 3"  (160 cm) Weight: 99 lb 3.3 oz (45 kg) IBW/kg (Calculated) : 52.4  Temp (24hrs), Avg:98.2 F (36.8 C), Min:98.2 F (36.8 C), Max:98.2 F (36.8 C)  Recent Labs  Lab 09/03/19 1641 09/04/19 0627  WBC 8.4 8.6  CREATININE 0.57 0.54    Estimated Creatinine Clearance: 39.8 mL/min (by C-G formula based on SCr of 0.54 mg/dL).    Allergies  Allergen Reactions  . Azithromycin Anaphylaxis  . Hydrocodone-Acetaminophen Anaphylaxis  . Mobic [Meloxicam] Swelling  . Ondansetron Hcl Other (See Comments) and Swelling    BLOOD CLOT;caused cardiac arrest BLOOD CLOT;caused cardiac arrest  . Penicillins Shortness Of Breath    Has patient had a PCN reaction causing immediate rash, facial/tongue/throat swelling, SOB or lightheadedness with hypotension: Yes Has patient had a PCN reaction causing severe rash involving mucus membranes or skin necrosis: No Has patient had a PCN reaction that required hospitalization: No Has patient had a PCN reaction occurring within the last 10 years: No If all of the above answers are "NO", then may proceed with Cephalosporin use.   Marland Kitchen Percocet [Oxycodone-Acetaminophen] Anaphylaxis  . Reclast [Zoledronic Acid] Swelling  . Vicodin [Hydrocodone-Acetaminophen] Anaphylaxis  . Codeine Other (See Comments)    Went into cardiac arrest at [facility] with this after surgery  . Ibandronic Acid   . Oxycodone Swelling    Swelling of tongue    Antimicrobials this admission: 12/1 levofloxacin >>   Microbiology results: 12/1 UCx: pending   Thank you for allowing pharmacy to be a part of this patient's care.  Lu Duffel, PharmD,  BCPS Clinical Pharmacist 09/04/2019 12:04 PM

## 2019-09-04 NOTE — ED Notes (Signed)
Pt restless in bed, wincing in what appears to be pain, mumbling, bending arm causing alarm to sound, this rn reminded pt many times to keep arm straight. Order obtained for valium to relax pt.

## 2019-09-04 NOTE — ED Notes (Signed)
Pt brief is dry at this time

## 2019-09-04 NOTE — Consult Note (Signed)
ORTHOPAEDIC CONSULTATION  PATIENT NAME: Monica Downs DOB: 1941/01/05  MRN: [id number]  REQUESTING PHYSICIAN: Fritzi Mandes, MD  Chief Complaint: Right shoulder instability  HPI: Monica Downs is a 78 y.o. right hand dominant female who presented to the ED yesterday from [facility] via EMS following an unwitnessed fall. She has a history of traumatic brain injury and dementia. Upon presentation she denied any right shoulder pain or injury, but CXR demonstrated possibility of subluxation. She presently denies any right shoulder or arm pain.  Of note, she is in a cervical collar for treatment of a chronic C1 fracture.  Past Medical History:  Diagnosis Date  . Allergic rhinitis   . Anxiety   . CAD (coronary artery disease)    a. 07/2007 Cath/PCI: LM nl, LAD 99p (3.0 x 18 Xience EES), D1 90ost, LCX nl, RCA nl;  b. 09/2014 MV: EF 72%, attenuation artifact, no ischemia.  . Clotting disorder ([place])    LEGS;throat  . Falls   . History of subarachnoid hemorrhage    a. 04/2016 In setting of fall-->traumatic brain injury.  Marland Kitchen HLD (hyperlipidemia)   . Hypertension   . Hypothyroidism   . Incontinence   . Left Forearm AV Fistula    a. Asymptomatic.  No blood draws/IV's;  b. 06/2013 confirmed by L FA duplex.  . Mitral regurgitation    a. 02/2011 Echo: EF 72%, trace TR, mild MR/AI; b. 01/2018 Echo: EF 60-65%, no rwma, Gr2 DD, mild AI/MR. Nl RV fxn. Mildly dil RV/RA. Mod TR. Nl PASP.  Marland Kitchen Obstructive sleep apnea   . Panic attacks   . Traumatic brain injury ([place])    a. 04/2016 in setting of fall/SAH.   Past Surgical History:  Procedure Laterality Date  . BUNIONETTE EXCISION    . CARDIAC CATHETERIZATION  07/2007   99% mid LAD stenosis  . CORONARY STENT PLACEMENT  07/2007   Mid LAD: 3.0 X 18 mm Xience stent  . MULTIPLE TOOTH EXTRACTIONS    . THYROIDECTOMY    . TOTAL KNEE ARTHROPLASTY     left   Social History   Socioeconomic History  . Marital status: Widowed    Spouse name:  Not on file  . Number of children: 1  . Years of education: Not on file  . Highest education level: Not on file  Occupational History  . Occupation: retired  Scientific laboratory technician  . Financial resource strain: Not on file  . Food insecurity    Worry: Not on file    Inability: Not on file  . Transportation needs    Medical: Not on file    Non-medical: Not on file  Tobacco Use  . Smoking status: Former Smoker    Packs/day: 0.10    Years: 2.00    Pack years: 0.20    Types: Cigarettes    Quit date: 10/04/1991    Years since quitting: 27.9  . Smokeless tobacco: Never Used  . Tobacco comment: smoked on and off  Substance and Sexual Activity  . Alcohol use: No  . Drug use: No  . Sexual activity: Not on file  Lifestyle  . Physical activity    Days per week: Not on file    Minutes per session: Not on file  . Stress: Not on file  Relationships  . Social Herbalist on phone: Not on file    Gets together: Not on file    Attends religious service: Not on file    Active  member of club or organization: Not on file    Attends meetings of clubs or organizations: Not on file    Relationship status: Not on file  Other Topics Concern  . Not on file  Social History Narrative  . Not on file   Family History  Problem Relation Age of Onset  . Heart disease Mother   . Hyperlipidemia Mother   . Tuberculosis Mother   . Colon cancer Father   . Colon cancer Other   . Lymphoma Other   . Heart disease Other   . Breast cancer Neg Hx    Allergies  Allergen Reactions  . Azithromycin Anaphylaxis  . Hydrocodone-Acetaminophen Anaphylaxis  . Mobic [Meloxicam] Swelling  . Ondansetron Hcl Other (See Comments) and Swelling    BLOOD CLOT;caused cardiac arrest BLOOD CLOT;caused cardiac arrest  . Penicillins Shortness Of Breath    Has patient had a PCN reaction causing immediate rash, facial/tongue/throat swelling, SOB or lightheadedness with hypotension: Yes Has patient had a PCN reaction  causing severe rash involving mucus membranes or skin necrosis: No Has patient had a PCN reaction that required hospitalization: No Has patient had a PCN reaction occurring within the last 10 years: No If all of the above answers are "NO", then may proceed with Cephalosporin use.   Marland Kitchen Percocet [Oxycodone-Acetaminophen] Anaphylaxis  . Reclast [Zoledronic Acid] Swelling  . Vicodin [Hydrocodone-Acetaminophen] Anaphylaxis  . Codeine Other (See Comments)    Went into cardiac arrest at [facility] with this after surgery  . Ibandronic Acid   . Oxycodone Swelling    Swelling of tongue   Prior to Admission medications   Medication Sig Start Date End Date Taking? Authorizing Provider  aspirin 81 MG tablet Take 81 mg by mouth daily.    Yes [provider]  atorvastatin (LIPITOR) 40 MG tablet TAKE ONE TABLET BY MOUTH EVERY DAY Patient taking differently: Take 40 mg by mouth daily.  12/14/18  Yes Iran Ouch, MD  donepezil (ARICEPT) 10 MG tablet Take 10 mg by mouth every morning.  09/02/16 09/04/19 Yes [provider]  fluticasone (FLONASE) 50 MCG/ACT nasal spray Place 1 spray into both nostrils daily as needed for allergies or rhinitis (congestion).   Yes [provider]  levothyroxine (SYNTHROID, LEVOTHROID) 75 MCG tablet Take 75 mcg by mouth daily before breakfast.  01/08/16  Yes [provider]  miconazole (MICOTIN) 2 % cream Apply 1 application topically See admin instructions. Apply to sacrum topically every shift for stage 2 ulcer.   Yes [provider]  OLANZapine (ZYPREXA) 5 MG tablet Take 5 mg by mouth 2 (two) times daily.   Yes [provider]  Pollen Extracts (PROSTAT PO) Take 30 mLs by mouth 2 (two) times daily.   Yes [provider]  traZODone (DESYREL) 50 MG tablet Take 50 mg by mouth at bedtime.   Yes [provider]  Azelastine HCl 137 MCG/SPRAY SOLN Place 1 spray into both nostrils 2 (two) times daily.    [provider]  Cholecalciferol (VITAMIN D3) 1000 units CAPS Take 6,000 Units by mouth daily.     [provider]  denosumab (PROLIA) 60 MG/ML SOLN injection Inject 60 mg into the skin every 6 (six) months. Administer in upper arm, thigh, or abdomen    [provider]  divalproex (DEPAKOTE ER) 250 MG 24 hr tablet Take 1 tablet (250 mg total) by mouth every morning. Patient not taking: Reported on 09/04/2019 01/03/19   Mariel Craft, MD  ENSURE (ENSURE) Take 237 mLs by mouth 2 (two) times daily between meals.    [provider]  ferrous sulfate 325 (65 FE) MG tablet Take 325 mg by mouth daily with breakfast.    [provider]  QUEtiapine (SEROQUEL) 25 MG tablet Take 1 tablet (25 mg total) by mouth 2 (two) times daily. In afternoon and at night Patient not taking: Reported on 09/04/2019 01/03/19   Mariel Craft, MD  venlafaxine [facility]) 75 MG tablet Take 75 mg by mouth 2 (two) times daily.    [provider]  venlafaxine XR (EFFEXOR-XR) 75 MG 24 hr capsule Take 1 capsule (75 mg total) by mouth daily with breakfast. Patient not taking: Reported on 09/04/2019 01/04/19   Mariel Craft, MD   Dg Chest 1 View  Result Date: 09/03/2019 CLINICAL DATA:  Head trauma secondary to a fall. COVID-19. EXAM: CHEST  1 VIEW COMPARISON:  01/17/2019 FINDINGS: The heart size and pulmonary vascularity are normal. Aortic atherosclerosis. Multiple old healed right rib fractures. The right humeral head appears superiorly subluxed or dislocated. This may be projectional rather than a true dislocation or subluxation. No other acute bone abnormality. IMPRESSION: 1. No acute cardiopulmonary abnormalities. 2.  Aortic Atherosclerosis (ICD10-I70.0). 3. Possible superior subluxation or dislocation of the right humeral head. This may be projectional. Electronically Signed   By: Francene Boyers M.D.   On: 09/03/2019 17:45   Dg Shoulder 1v Right  Result Date: 09/03/2019 CLINICAL DATA:   Right shoulder subluxation after a fall. EXAM: RIGHT SHOULDER - 1 VIEW 8:35 p.m. COMPARISON:  Radiographs dated 09/03/2019 6:31 p.m. FINDINGS: There is superior subluxation of the humeral head with respect of the glenoid with articulation with the undersurface of the acromion, likely due to a chronic full-thickness rotator cuff tear. No appreciable dislocation at this time. This appears appreciably different than on the chest x-ray of 09/03/2019 at 5:25 p.m. IMPRESSION: 1. Chronic full-thickness rotator cuff tear with superior subluxation of the humeral head with respect of the glenoid. 2. No discrete dislocation. Electronically Signed   By: Francene Boyers M.D.   On: 09/03/2019 20:52   Dg Shoulder Right  Result Date: 09/03/2019 CLINICAL DATA:  Right shoulder subluxation or dislocation EXAM: RIGHT SHOULDER - 2+ VIEW COMPARISON:  None. FINDINGS: The humeral head appears to be anterior and superiorly subluxed in relation to the glenoid surface. No definite fracture seen. There is diffuse osteopenia. AC joint arthrosis is noted. IMPRESSION: Anterior superior subluxation of the humeral head. No definite fracture. Electronically Signed   By: Jonna Clark M.D.   On: 09/03/2019 18:56   Ct Head Wo Contrast  Result Date: 09/03/2019 CLINICAL DATA:  78 year old female with head trauma. EXAM: CT HEAD WITHOUT CONTRAST CT MAXILLOFACIAL WITHOUT CONTRAST CT CERVICAL SPINE WITHOUT CONTRAST TECHNIQUE: Multidetector CT imaging of the head, cervical spine, and maxillofacial structures were performed using the standard protocol without intravenous contrast. Multiplanar CT image reconstructions of the cervical spine and maxillofacial structures were also generated. COMPARISON:  Head CT dated 04/17/2017. FINDINGS: CT HEAD FINDINGS Brain: There is mild age-related atrophy and chronic microvascular ischemic changes. There is no acute intracranial hemorrhage. No mass effect or midline shift. No extra-axial fluid collection. Vascular:  No hyperdense vessel or unexpected calcification. Skull: Normal. Negative for fracture or focal lesion. Other: Mild right posterior scalp contusion. CT MAXILLOFACIAL FINDINGS Osseous: No acute fracture. No mandibular subluxation. Bilateral TMJ disease. Orbits: Negative. No traumatic or inflammatory finding. Sinuses: Clear. Soft tissues: Negative. CT CERVICAL SPINE FINDINGS  Alignment: No acute subluxation. Grade 1 C4-C5 anterolisthesis. Skull base and vertebrae: There is nondisplaced fracture of the anterior ring of C1. No other acute fracture identified. The bones are osteopenic. Soft tissues and spinal canal: No prevertebral fluid or swelling. No visible canal hematoma. Disc levels: Multilevel degenerative changes with endplate irregularity and disc space narrowing most prominent at C4-C5, C5-C6, and C6-C7. Upper chest: A 4 mm right upper lobe subpleural nodule. Other: Bilateral carotid bulb calcified plaques. IMPRESSION: 1. No acute intracranial hemorrhage. Mild age-related atrophy and chronic microvascular ischemic changes. 2. Nondisplaced fracture of the anterior ring of C1. 3. No acute facial bone fractures. These results were called by telephone at the time of interpretation on 09/03/2019 at 5:35 pm to provider [facility] , who verbally acknowledged these results. Electronically Signed   By: Elgie CollardArash  Radparvar M.D.   On: 09/03/2019 17:49   Ct Cervical Spine Wo Contrast  Result Date: 09/03/2019 CLINICAL DATA:  78 year old female with head trauma. EXAM: CT HEAD WITHOUT CONTRAST CT MAXILLOFACIAL WITHOUT CONTRAST CT CERVICAL SPINE WITHOUT CONTRAST TECHNIQUE: Multidetector CT imaging of the head, cervical spine, and maxillofacial structures were performed using the standard protocol without intravenous contrast. Multiplanar CT image reconstructions of the cervical spine and maxillofacial structures were also generated. COMPARISON:  Head CT dated 04/17/2017. FINDINGS: CT HEAD FINDINGS Brain: There is mild age-related  atrophy and chronic microvascular ischemic changes. There is no acute intracranial hemorrhage. No mass effect or midline shift. No extra-axial fluid collection. Vascular: No hyperdense vessel or unexpected calcification. Skull: Normal. Negative for fracture or focal lesion. Other: Mild right posterior scalp contusion. CT MAXILLOFACIAL FINDINGS Osseous: No acute fracture. No mandibular subluxation. Bilateral TMJ disease. Orbits: Negative. No traumatic or inflammatory finding. Sinuses: Clear. Soft tissues: Negative. CT CERVICAL SPINE FINDINGS Alignment: No acute subluxation. Grade 1 C4-C5 anterolisthesis. Skull base and vertebrae: There is nondisplaced fracture of the anterior ring of C1. No other acute fracture identified. The bones are osteopenic. Soft tissues and spinal canal: No prevertebral fluid or swelling. No visible canal hematoma. Disc levels: Multilevel degenerative changes with endplate irregularity and disc space narrowing most prominent at C4-C5, C5-C6, and C6-C7. Upper chest: A 4 mm right upper lobe subpleural nodule. Other: Bilateral carotid bulb calcified plaques. IMPRESSION: 1. No acute intracranial hemorrhage. Mild age-related atrophy and chronic microvascular ischemic changes. 2. Nondisplaced fracture of the anterior ring of C1. 3. No acute facial bone fractures. These results were called by telephone at the time of interpretation on 09/03/2019 at 5:35 pm to provider [facility] , who verbally acknowledged these results. Electronically Signed   By: Elgie CollardArash  Radparvar M.D.   On: 09/03/2019 17:49   Ct Maxillofacial Wo Contrast  Result Date: 09/03/2019 CLINICAL DATA:  78 year old female with head trauma. EXAM: CT HEAD WITHOUT CONTRAST CT MAXILLOFACIAL WITHOUT CONTRAST CT CERVICAL SPINE WITHOUT CONTRAST TECHNIQUE: Multidetector CT imaging of the head, cervical spine, and maxillofacial structures were performed using the standard protocol without intravenous contrast. Multiplanar CT image reconstructions  of the cervical spine and maxillofacial structures were also generated. COMPARISON:  Head CT dated 04/17/2017. FINDINGS: CT HEAD FINDINGS Brain: There is mild age-related atrophy and chronic microvascular ischemic changes. There is no acute intracranial hemorrhage. No mass effect or midline shift. No extra-axial fluid collection. Vascular: No hyperdense vessel or unexpected calcification. Skull: Normal. Negative for fracture or focal lesion. Other: Mild right posterior scalp contusion. CT MAXILLOFACIAL FINDINGS Osseous: No acute fracture. No mandibular subluxation. Bilateral TMJ disease. Orbits: Negative. No traumatic or inflammatory finding. Sinuses: Clear.  Soft tissues: Negative. CT CERVICAL SPINE FINDINGS Alignment: No acute subluxation. Grade 1 C4-C5 anterolisthesis. Skull base and vertebrae: There is nondisplaced fracture of the anterior ring of C1. No other acute fracture identified. The bones are osteopenic. Soft tissues and spinal canal: No prevertebral fluid or swelling. No visible canal hematoma. Disc levels: Multilevel degenerative changes with endplate irregularity and disc space narrowing most prominent at C4-C5, C5-C6, and C6-C7. Upper chest: A 4 mm right upper lobe subpleural nodule. Other: Bilateral carotid bulb calcified plaques. IMPRESSION: 1. No acute intracranial hemorrhage. Mild age-related atrophy and chronic microvascular ischemic changes. 2. Nondisplaced fracture of the anterior ring of C1. 3. No acute facial bone fractures. These results were called by telephone at the time of interpretation on 09/03/2019 at 5:35 pm to provider [facility] , who verbally acknowledged these results. Electronically Signed   By: Elgie Collard M.D.   On: 09/03/2019 17:49    Positive ROS: All other systems have been reviewed and were otherwise negative with the exception of those mentioned in the HPI and as above.  Physical Exam: General: Well developed, well nourished female seen in no apparent  discomfort.Marland Kitchen HEENT: Atraumatic and normocephalic. Sclera are clear. Extraocular motion is intact. Oropharynx is clear with moist mucosa. Neck: Cervical collar is in place. Lungs: Clear to auscultation bilaterally. Cardiovascular: Regular rate and rhythm with normal S1 and S2. No murmurs. No gallops or rubs. No significant pretibial or ankle edema. Abdomen: Soft, nontender, and nondistended. Bowel sounds are present. Skin: No lesions in the area of chief complaint Neurologic: Awake, alert, but not oriented. Sensory function is grossly intact. Motor strength is difficult to assess due to the patient's difficulty with following directions. No clonus or tremor. Good motor coordination. Lymphatic: No axillary or cervical lymphadenopathy  MUSCULOSKELETAL: Pertinent musculoskeletal exam involves the right shoulder and upper arm. The patient is in a sem-reclined position and tends to have the shoulder "hiked" superiorly. The humeral head appears to be prominent anteriorly. There is no tenderness to palpation along the antrior or posterior aspects of the shoulder. Sulcus sign is negative. Gentle passive range of motion of the right shoulder is well tolerated without gross guarding. No appreciable crepitance with range of motion. Rotator cuff strength is difficult to assess due to the patient's inability to follow commands.  Assessment: Global instability of the right shoulder possibly related to massive rotator cuff tear  Plan: There are no previous radiographs of the right shoulder for comparison. The patient does not demonstrate any gross signs of discomfort with shoulder motion. The clinical findings are suggestive of a chronic condition. Given the patient's multiple comorbidities, I do not believe any surgical intervention is warranted. I recommend symptomatic measures with activity as tolerated.  Bartley Vuolo P. Angie Fava M.D.

## 2019-09-04 NOTE — Progress Notes (Signed)
Patient too confused to answer admissions questions. Will continue to monitor patient.

## 2019-09-04 NOTE — Progress Notes (Signed)
[facility]  - [place] at [facility]   PATIENT NAME: Heywood BeneLinda Walski    MR#:  [id number]  DATE OF BIRTH:  1941-01-27  SUBJECTIVE:  patient has advanced dementia unable to give any history review of systems. She came in from [facility] after she had unwitnessed fall. No trauma reported. Workup so far suggests no trauma. At baseline she does not communicate, she is dependent on her ADLs including feeding. Spoke with daughter Horton Chinam Fudala on the phone. Patient has had UTI in the past.  REVIEW OF SYSTEMS:   Review of Systems  Unable to perform ROS: Dementia     DRUG ALLERGIES:   Allergies  Allergen Reactions  . Azithromycin Anaphylaxis  . Hydrocodone-Acetaminophen Anaphylaxis  . Mobic [Meloxicam] Swelling  . Ondansetron Hcl Other (See Comments) and Swelling    BLOOD CLOT;caused cardiac arrest BLOOD CLOT;caused cardiac arrest  . Penicillins Shortness Of Breath    Has patient had a PCN reaction causing immediate rash, facial/tongue/throat swelling, SOB or lightheadedness with hypotension: Yes Has patient had a PCN reaction causing severe rash involving mucus membranes or skin necrosis: No Has patient had a PCN reaction that required hospitalization: No Has patient had a PCN reaction occurring within the last 10 years: No If all of the above answers are "NO", then may proceed with Cephalosporin use.   Marland Kitchen. Percocet [Oxycodone-Acetaminophen] Anaphylaxis  . Reclast [Zoledronic Acid] Swelling  . Vicodin [Hydrocodone-Acetaminophen] Anaphylaxis  . Codeine Other (See Comments)    [facility]  . Ibandronic Acid   . Oxycodone Swelling    Swelling of tongue    VITALS:  Blood pressure 127/65, pulse 70, temperature 98.2 F (36.8 C), temperature source Axillary, resp. rate 17, height 5\' 3"  (1.6 m), weight 45 kg, SpO2 98 %.  PHYSICAL EXAMINATION:   Physical Exam limited exam advanced dementia  GENERAL:  78 y.o.-year-old  patient lying in the bed with no acute distress.  EYES: Pupils equal, round, reactive to light and accommodation. No scleral icterus. Extraocular muscles intact.  Marland Kitchen.  NECK:  Supple, no jugular venous distention. No thyroid enlargement, no tenderness. Chronic C: LUNGS: Normal breath sounds bilaterally, no wheezing, rales, rhonchi. No use of accessory muscles of respiration.  CARDIOVASCULAR: S1, S2 normal. No murmurs, rubs, or gallops.  ABDOMEN: Soft, nontender, nondistended. Bowel sounds present. No organomegaly or mass.  EXTREMITIES: No cyanosis, clubbing or edema b/l.    NEUROLOGIC: able to assess PSYCHIATRIC:  patient i is advanced dementia does not communicate at baseline SKIN: RN documentation LABORATORY PANEL:  CBC Recent Labs  Lab 09/04/19 0627  WBC 8.6  HGB 11.1*  HCT 34.4*  PLT 258    Chemistries  Recent Labs  Lab 09/04/19 0627  NA 143  K 3.2*  CL 105  CO2 28  GLUCOSE 86  BUN 14  CREATININE 0.54  CALCIUM 7.7*  AST 30  ALT 15  ALKPHOS 81  BILITOT 0.8   Cardiac Enzymes No results for input(s): TROPONINI in the last 168 hours. RADIOLOGY:  Dg Chest 1 View  Result Date: 09/03/2019 CLINICAL DATA:  Head trauma secondary to a fall. COVID-19. EXAM: CHEST  1 VIEW COMPARISON:  01/17/2019 FINDINGS: The heart size and pulmonary vascularity are normal. Aortic atherosclerosis. Multiple old healed right rib fractures. The right humeral head appears superiorly subluxed or dislocated. This may be projectional rather than a true dislocation or subluxation. No other acute bone abnormality. IMPRESSION: 1. No acute cardiopulmonary abnormalities. 2.  Aortic Atherosclerosis (ICD10-I70.0). 3. Possible superior subluxation or dislocation of the right humeral head. This may be projectional. Electronically Signed   By: Lorriane Shire M.D.   On: 09/03/2019 17:45   Dg Shoulder 1v Right  Result Date: 09/03/2019 CLINICAL DATA:  Right shoulder subluxation after a fall. EXAM: RIGHT SHOULDER - 1  VIEW 8:35 p.m. COMPARISON:  Radiographs dated 09/03/2019 6:31 p.m. FINDINGS: There is superior subluxation of the humeral head with respect of the glenoid with articulation with the undersurface of the acromion, likely due to a chronic full-thickness rotator cuff tear. No appreciable dislocation at this time. This appears appreciably different than on the chest x-ray of 09/03/2019 at 5:25 p.m. IMPRESSION: 1. Chronic full-thickness rotator cuff tear with superior subluxation of the humeral head with respect of the glenoid. 2. No discrete dislocation. Electronically Signed   By: Lorriane Shire M.D.   On: 09/03/2019 20:52   Dg Shoulder Right  Result Date: 09/03/2019 CLINICAL DATA:  Right shoulder subluxation or dislocation EXAM: RIGHT SHOULDER - 2+ VIEW COMPARISON:  None. FINDINGS: The humeral head appears to be anterior and superiorly subluxed in relation to the glenoid surface. No definite fracture seen. There is diffuse osteopenia. AC joint arthrosis is noted. IMPRESSION: Anterior superior subluxation of the humeral head. No definite fracture. Electronically Signed   By: Prudencio Pair M.D.   On: 09/03/2019 18:56   Ct Head Wo Contrast  Result Date: 09/03/2019 CLINICAL DATA:  78 year old female with head trauma. EXAM: CT HEAD WITHOUT CONTRAST CT MAXILLOFACIAL WITHOUT CONTRAST CT CERVICAL SPINE WITHOUT CONTRAST TECHNIQUE: Multidetector CT imaging of the head, cervical spine, and maxillofacial structures were performed using the standard protocol without intravenous contrast. Multiplanar CT image reconstructions of the cervical spine and maxillofacial structures were also generated. COMPARISON:  Head CT dated 04/17/2017. FINDINGS: CT HEAD FINDINGS Brain: There is mild age-related atrophy and chronic microvascular ischemic changes. There is no acute intracranial hemorrhage. No mass effect or midline shift. No extra-axial fluid collection. Vascular: No hyperdense vessel or unexpected calcification. Skull: Normal.  Negative for fracture or focal lesion. Other: Mild right posterior scalp contusion. CT MAXILLOFACIAL FINDINGS Osseous: No acute fracture. No mandibular subluxation. Bilateral TMJ disease. Orbits: Negative. No traumatic or inflammatory finding. Sinuses: Clear. Soft tissues: Negative. CT CERVICAL SPINE FINDINGS Alignment: No acute subluxation. Grade 1 C4-C5 anterolisthesis. Skull base and vertebrae: There is nondisplaced fracture of the anterior ring of C1. No other acute fracture identified. The bones are osteopenic. Soft tissues and spinal canal: No prevertebral fluid or swelling. No visible canal hematoma. Disc levels: Multilevel degenerative changes with endplate irregularity and disc space narrowing most prominent at C4-C5, C5-C6, and C6-C7. Upper chest: A 4 mm right upper lobe subpleural nodule. Other: Bilateral carotid bulb calcified plaques. IMPRESSION: 1. No acute intracranial hemorrhage. Mild age-related atrophy and chronic microvascular ischemic changes. 2. Nondisplaced fracture of the anterior ring of C1. 3. No acute facial bone fractures. These results were called by telephone at the time of interpretation on 09/03/2019 at 5:35 pm to provider [facility] , who verbally acknowledged these results. Electronically Signed   By: Anner Crete M.D.   On: 09/03/2019 17:49   Ct Cervical Spine Wo Contrast  Result Date: 09/03/2019 CLINICAL DATA:  78 year old female with head trauma. EXAM: CT HEAD WITHOUT CONTRAST CT MAXILLOFACIAL WITHOUT CONTRAST CT CERVICAL SPINE WITHOUT CONTRAST TECHNIQUE: Multidetector CT imaging of the head, cervical spine, and maxillofacial structures were performed using the standard protocol without intravenous contrast. Multiplanar CT image reconstructions of the cervical  spine and maxillofacial structures were also generated. COMPARISON:  Head CT dated 04/17/2017. FINDINGS: CT HEAD FINDINGS Brain: There is mild age-related atrophy and chronic microvascular ischemic changes. There is no  acute intracranial hemorrhage. No mass effect or midline shift. No extra-axial fluid collection. Vascular: No hyperdense vessel or unexpected calcification. Skull: Normal. Negative for fracture or focal lesion. Other: Mild right posterior scalp contusion. CT MAXILLOFACIAL FINDINGS Osseous: No acute fracture. No mandibular subluxation. Bilateral TMJ disease. Orbits: Negative. No traumatic or inflammatory finding. Sinuses: Clear. Soft tissues: Negative. CT CERVICAL SPINE FINDINGS Alignment: No acute subluxation. Grade 1 C4-C5 anterolisthesis. Skull base and vertebrae: There is nondisplaced fracture of the anterior ring of C1. No other acute fracture identified. The bones are osteopenic. Soft tissues and spinal canal: No prevertebral fluid or swelling. No visible canal hematoma. Disc levels: Multilevel degenerative changes with endplate irregularity and disc space narrowing most prominent at C4-C5, C5-C6, and C6-C7. Upper chest: A 4 mm right upper lobe subpleural nodule. Other: Bilateral carotid bulb calcified plaques. IMPRESSION: 1. No acute intracranial hemorrhage. Mild age-related atrophy and chronic microvascular ischemic changes. 2. Nondisplaced fracture of the anterior ring of C1. 3. No acute facial bone fractures. These results were called by telephone at the time of interpretation on 09/03/2019 at 5:35 pm to provider [facility] , who verbally acknowledged these results. Electronically Signed   By: Elgie Collard M.D.   On: 09/03/2019 17:49   Ct Maxillofacial Wo Contrast  Result Date: 09/03/2019 CLINICAL DATA:  78 year old female with head trauma. EXAM: CT HEAD WITHOUT CONTRAST CT MAXILLOFACIAL WITHOUT CONTRAST CT CERVICAL SPINE WITHOUT CONTRAST TECHNIQUE: Multidetector CT imaging of the head, cervical spine, and maxillofacial structures were performed using the standard protocol without intravenous contrast. Multiplanar CT image reconstructions of the cervical spine and maxillofacial structures were also  generated. COMPARISON:  Head CT dated 04/17/2017. FINDINGS: CT HEAD FINDINGS Brain: There is mild age-related atrophy and chronic microvascular ischemic changes. There is no acute intracranial hemorrhage. No mass effect or midline shift. No extra-axial fluid collection. Vascular: No hyperdense vessel or unexpected calcification. Skull: Normal. Negative for fracture or focal lesion. Other: Mild right posterior scalp contusion. CT MAXILLOFACIAL FINDINGS Osseous: No acute fracture. No mandibular subluxation. Bilateral TMJ disease. Orbits: Negative. No traumatic or inflammatory finding. Sinuses: Clear. Soft tissues: Negative. CT CERVICAL SPINE FINDINGS Alignment: No acute subluxation. Grade 1 C4-C5 anterolisthesis. Skull base and vertebrae: There is nondisplaced fracture of the anterior ring of C1. No other acute fracture identified. The bones are osteopenic. Soft tissues and spinal canal: No prevertebral fluid or swelling. No visible canal hematoma. Disc levels: Multilevel degenerative changes with endplate irregularity and disc space narrowing most prominent at C4-C5, C5-C6, and C6-C7. Upper chest: A 4 mm right upper lobe subpleural nodule. Other: Bilateral carotid bulb calcified plaques. IMPRESSION: 1. No acute intracranial hemorrhage. Mild age-related atrophy and chronic microvascular ischemic changes. 2. Nondisplaced fracture of the anterior ring of C1. 3. No acute facial bone fractures. These results were called by telephone at the time of interpretation on 09/03/2019 at 5:35 pm to provider [facility] , who verbally acknowledged these results. Electronically Signed   By: Elgie Collard M.D.   On: 09/03/2019 17:49   ASSESSMENT AND PLAN:  Britnay Magnussen  is a 78 y.o. female, w  Anxiety, Dementia,  OSA, Hypothyroidism, Osteoporosis, Hypertension, CAD, s/p LAD DES, mitral regurgitation, hx of subarachnoid hemorrhage, Chronic C1 fracture, covid -19 positive has unwitnessed fall.  Pt is unable to provide any meaningful  history due to  dementia  1. Altered mental status/acute encephalopathy suspected due to acute cystitis in the setting of advanced dementia -baseline patient is nonverbal at not ambulatory. She has advanced dementia. -She is from [facility]. Had unwitnessed fall -radiological workup does not show any trauma -IV Levaquin for three days -urine culture pending. UA shows positive WBC, dehydrated leukocyte*with cloudy urine -no fever, white count normal  2. C1 fracture status post follow the past -continue collar  3. Right shoulder subluxation with chronic rotator cuff tear changes or x-ray -PRN pain meds -Dr. Lutricia Horsfall to see patient -continue conservative management  4. Advanced dementia with progressive worsening according to the daughter -patient is long-term at [facility] she is dependent on all her ADLs -at baseline she does not communicate much -pt is a DNR prior to admission. This was confirmed with patient's daughter Lynann Beaver -patient will return back to [facility] at discharge  5. Hypokalemia replete with IV potassium  6. Hypothyroidism continue Synthroid  7. History of coronary artery disease status post stent in the past -elevated troponin mildly appears demand ischemia -EKG does not show any acute ST elevation her depression -echo ordered results pending  8. COVID-19 positive about two weeks ago -COVID test from yesterday pending    Family communication : [name] on the phone Consults : Dr. Lutricia Horsfall orthopedic Discharge Disposition : [facility] tomorrow CODE STATUS: DNR prior to admission DVT Prophylaxis : Lovenox  TOTAL TIME TAKING CARE OF THIS PATIENT: **30* minutes.  >50% time spent on counselling and coordination of care  POSSIBLE D/C IN *1 DAYS, DEPENDING ON CLINICAL CONDITION.  Note: This dictation was prepared with Dragon dictation along with smaller phrase technology. Any transcriptional errors that result from this  process are unintentional.  Enedina Finner M.D on 09/04/2019 at 1:04 PM  Between 7am to 6pm - Pager - [phone]  After 6pm go to [email or web address]  [facility]   CC: Primary care physician; Wilford Corner, PA-CPatient ID: Daniel Nones, female   DOB: 27-Dec-1940, 78 y.o.   MRN: [id number]

## 2019-09-05 DIAGNOSIS — W19XXXD Unspecified fall, subsequent encounter: Secondary | ICD-10-CM

## 2019-09-05 MED ORDER — ADULT MULTIVITAMIN W/MINERALS CH: 1.0000 | ORAL_TABLET | Freq: Every day | ORAL | 0 refills | Status: AC

## 2019-09-05 MED ORDER — ADULT MULTIVITAMIN W/MINERALS CH: 1.0000 | ORAL_TABLET | Freq: Every day | ORAL | Status: DC

## 2019-09-05 MED ORDER — LEVOFLOXACIN 250 MG PO TABS
250.0000 mg | ORAL_TABLET | Freq: Every day | ORAL | Status: AC
  Administered 2019-09-05: 250 mg via ORAL
  Filled 2019-09-05: qty 1

## 2019-09-05 NOTE — Progress Notes (Signed)
Initial Nutrition Assessment  DOCUMENTATION CODES:   Underweight  INTERVENTION:   Ensure Enlive po BID, each supplement provides 350 kcal and 20 grams of protein  Magic cup TID with meals, each supplement provides 290 kcal and 9 grams of protein  MVI daily   Dysphagia 3 diet   NUTRITION DIAGNOSIS:   Inadequate oral intake related to acute illness as evidenced by meal completion < 25%.  GOAL:   Patient will meet greater than or equal to 90% of their needs  MONITOR:   PO intake, Supplement acceptance, Labs, Weight trends, Skin, I & O's  REASON FOR ASSESSMENT:   Other (Comment)(Low BMI)    ASSESSMENT:   78 y.o. female, w  Anxiety, Dementia,  OSA, Hypothyroidism, Osteoporosis, Hypertension, CAD, s/p LAD DES, mitral regurgitation, hx of subarachnoid hemorrhage, Chronic C1 fracture, covid -19 positive has unwitnessed fall.   Unable to speak with patient r/t dementia. Per chart review, pt eating sips and bites of meals in hospital. RD suspects pt with poor appetite and oral intake at baseline r/t dementia as pt is underweight. Recommend continue Ensure supplements; RD will add Magic Cups to meal trays and MVI.  Per chart, pt appears fairly weight stable pta.   Medications reviewed and include: aspirin, vitamin D, lovenox, ferrous sulfate, synthroid, NaCl @75ml /hr  Labs reviewed: K 3.2(L)  Unable to complete Nutrition-Focused physical exam at this time as patient with COVID 19.   Diet Order:   Diet Order            DIET SOFT Room service appropriate? No; Fluid consistency: Thin  Diet effective now             EDUCATION NEEDS:   Not appropriate for education at this time  Skin:  Skin Assessment: Reviewed RN Assessment  Last BM:  PTA  Height:   Ht Readings from Last 1 Encounters:  09/03/19 5\' 3"  (1.6 m)    Weight:   Wt Readings from Last 1 Encounters:  09/03/19 45 kg    Ideal Body Weight:  52.3 kg  BMI:  Body mass index is 17.57 kg/m.  Estimated  Nutritional Needs:   Kcal:  1300-1500kcal/day  Protein:  65-75g/day  Fluid:  >1.1L/day  Koleen Distance MS, RD, LDN Pager #- [phone] Office#- [phone] After Hours Pager: [phone].

## 2019-09-05 NOTE — NC FL2 (Signed)
[place] MEDICAID FL2 LEVEL OF CARE SCREENING TOOL     IDENTIFICATION  Patient Name: Monica Downs Birthdate: 09-May-1941 Sex: female Admission Date (Current Location): 09/03/2019  [facility] and [place] Number:  Chiropodist and Address:         Provider Number: [id number]  Attending Physician Name and Address:  Enedina Finner, MD  Relative Name and Phone Number:       Current Level of Care: Hospital Recommended Level of Care: Skilled Nursing Facility Prior Approval Number:    Date Approved/Denied:   PASRR Number: [id number]  Discharge Plan: SNF    Current Diagnoses: Patient Active Problem List   Diagnosis Date Noted  . Altered mental status 09/04/2019  . Acute cystitis with hematuria   . Nondisplaced posterior arch fracture of first cervical vertebra with routine healing   . COVID-19   . Fall   . Hypokalemia 09/03/2019  . Acute lower UTI 09/03/2019  . AMS (altered mental status) 09/03/2019  . Elevated troponin 09/03/2019  . Unspecified dementia with behavioral disturbance (HCC) 01/02/2019  . Traumatic subarachnoid hemorrhage (HCC) 04/11/2016  . 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 CAD (coronary artery disease)   . HLD (hyperlipidemia)     Orientation RESPIRATION BLADDER Height & Weight     Self  Normal Incontinent Weight: 45 kg Height:  5\' 3"  (160 cm)  BEHAVIORAL SYMPTOMS/MOOD NEUROLOGICAL BOWEL NUTRITION STATUS      Incontinent Diet(Dys 3)  AMBULATORY STATUS COMMUNICATION OF NEEDS Skin     Verbally Normal                       Personal Care Assistance Level of Assistance  Total care           Functional Limitations Info             SPECIAL CARE FACTORS FREQUENCY                       Contractures Contractures Info: Not present    Additional Factors Info  Code Status, Allergies Code Status Info: DNR Allergies Info: Azithromycin, Hydrocodone-acetaminophen, Mobic Ondansetron  Hcl, Penicillins, Percocet, Reclast , Vicodin, Codeine, Ibandronic Acid, Oxycodone           Current Medications (09/05/2019):  This is the current hospital active medication list Current Facility-Administered Medications  Medication Dose Route Frequency Provider Last Rate Last Dose  . 0.9 %  sodium chloride infusion  250 mL Intravenous PRN 14/12/2018, MD      . aspirin EC tablet 81 mg  81 mg Oral Daily Pearson Grippe, MD   81 mg at 09/04/19 2232  . atorvastatin (LIPITOR) tablet 40 mg  40 mg Oral Daily 2233, MD   40 mg at 09/05/19 1128  . azelastine (ASTELIN) 0.1 % nasal spray 1 spray  1 spray Each Nare BID 14/03/20, MD   1 spray at 09/05/19 1125  . cholecalciferol (VITAMIN D) tablet 6,000 Units  6,000 Units Oral Daily 14/03/20, MD   6,000 Units at 09/05/19 1127  . divalproex (DEPAKOTE ER) 24 hr tablet 250 mg  250 mg Oral 14/03/20, MD   250 mg at 09/05/19 1127  . donepezil (ARICEPT) tablet 10 mg  10 mg Oral 14/03/20, MD   10 mg at 09/05/19 1126  . enoxaparin (LOVENOX) injection 40 mg  40 mg Subcutaneous Q24H 14/03/20, MD  40 mg at 09/04/19 2233  . feeding supplement (ENSURE ENLIVE) (ENSURE ENLIVE) liquid 237 mL  237 mL Oral BID BM Jani Gravel, MD   237 mL at 09/05/19 1124  . ferrous sulfate tablet 325 mg  325 mg Oral Q breakfast Jani Gravel, MD   325 mg at 09/05/19 1126  . levofloxacin (LEVAQUIN) tablet 250 mg  250 mg Oral Daily Fritzi Mandes, MD      . levothyroxine (SYNTHROID) tablet 75 mcg  75 mcg Oral Q0600 Fritzi Mandes, MD   75 mcg at 09/05/19 0551  . lidocaine-EPINEPHrine (XYLOCAINE W/EPI) 2 %-1:100000 (with pres) injection 20 mL  20 mL Intradermal Once Duffy Bruce, MD      . Derrill Memo ON 09/06/2019] multivitamin with minerals tablet 1 tablet  1 tablet Oral Daily Fritzi Mandes, MD      . QUEtiapine (SEROQUEL) tablet 50 mg  50 mg Oral Loma Sousa, MD   50 mg at 09/04/19 2232  . sodium chloride flush (NS) 0.9 % injection 3 mL  3 mL Intravenous Q12H Jani Gravel, MD   3  mL at 09/04/19 2234  . sodium chloride flush (NS) 0.9 % injection 3 mL  3 mL Intravenous PRN Jani Gravel, MD      . venlafaxine [facility]) tablet 75 mg  75 mg Oral BID Jani Gravel, MD   75 mg at 09/05/19 1126     Discharge Medications: Please see discharge summary for a list of discharge medications.  Relevant Imaging Results:  Relevant Lab Results:   Additional Information ss [id number]  Beverly Sessions, RN

## 2019-09-05 NOTE — Progress Notes (Signed)
Discharge order received. Patient mental status is at baseline. Vital signs stable . No signs of acute distress. Discharge instructions given to Monica Downs at [facility]. Questions answered. No other issues noted at this time. Pt. Transported back to [facility] via EMS.

## 2019-09-05 NOTE — TOC Transition Note (Signed)
Transition of Care [facility]) - CM/SW Discharge Note   Patient Details  Name: Monica Downs MRN: [id number] Date of Birth: Feb 18, 1941  Transition of Care [facility]) CM/SW Contact:  Beverly Sessions, RN Phone Number: 09/05/2019, 4:17 PM   Clinical Narrative:    Patient to return to [facility] long term care  [name] daughter notified  EMS packet and DNR on chart.   Discharge information sent to [facility] [place]  Final next level of care: Long Term Acute Care (LTAC) Barriers to Discharge: No Barriers Identified   Patient Goals and CMS Choice        Discharge Placement                Patient to be transferred to facility by: EMS Name of family member notified: [name] Patient and family notified of of transfer: 09/05/19  Discharge Plan and Services                                     Social Determinants of Health (SDOH) Interventions     Readmission Risk Interventions No flowsheet data found.

## 2019-09-05 NOTE — Discharge Summary (Signed)
[place] [facility] [place] NAME: Monica Downs    MR#:  [id number]  DATE OF BIRTH:  11/17/1940  DATE OF ADMISSION:  09/03/2019 ADMITTING PHYSICIAN: Fritzi Mandes, MD  DATE OF DISCHARGE: 09/05/2019  PRIMARY CARE PHYSICIAN: Donnamarie Rossetti, PA-C    ADMISSION DIAGNOSIS:  Fall [W19.XXXA] Acute cystitis with hematuria [N30.01] Fall, initial encounter [W19.XXXA] Subluxation of right shoulder joint, initial encounter [S43.001A] Laceration of scalp without foreign body, initial encounter [S01.01XA] Closed nondisplaced fracture of posterior arch of first cervical vertebra with routine healing, subsequent encounter [S12.031D] COVID-19 [U07.1] Altered mental status [R41.82]  DISCHARGE DIAGNOSIS:  acute encephalopathy suspected due to acute cystitis with advanced dementia chronic C1 fracture right shoulder anterior subluxation with chronic rotator cuff tear changes on  xray SECONDARY DIAGNOSIS:   Past Medical History:  Diagnosis Date  . Allergic rhinitis   . Anxiety   . CAD (coronary artery disease)    a. 07/2007 Cath/PCI: LM nl, LAD 99p (3.0 x 18 Xience EES), D1 90ost, LCX nl, RCA nl;  b. 09/2014 MV: EF 72%, attenuation artifact, no ischemia.  . Clotting disorder ([place])    LEGS;throat  . Falls   . History of subarachnoid hemorrhage    a. 04/2016 In setting of fall-->traumatic brain injury.  Marland Kitchen HLD (hyperlipidemia)   . Hypertension   . Hypothyroidism   . Incontinence   . Left Forearm AV Fistula    a. Asymptomatic.  No blood draws/IV's;  b. 06/2013 confirmed by L FA duplex.  . Mitral regurgitation    a. 02/2011 Echo: EF 72%, trace TR, mild MR/AI; b. 01/2018 Echo: EF 60-65%, no rwma, Gr2 DD, mild AI/MR. Nl RV fxn. Mildly dil RV/RA. Mod TR. Nl PASP.  Marland Kitchen Obstructive sleep apnea   . Panic attacks   . Traumatic brain injury ([place])    a. 04/2016 in setting of fall/SAH.    HOSPITAL COURSE:   [name] a78 y.o.female,w Anxiety, Dementia,  OSA, Hypothyroidism, Osteoporosis, Hypertension, CAD, s/p LAD DES, mitral regurgitation, hx of subarachnoid hemorrhage, Chronic C1 fracture, covid -19 positive has unwitnessed fall. Pt is unable to provide any meaningful history due to dementia  1. Altered mental status/acute encephalopathy suspected due to acute cystitis in the setting of advanced dementia -baseline patient is nonverbal at not ambulatory. She has advanced dementia. -She is from [employer]. Had unwitnessed fall -radiological workup does not show any trauma -Levaquin for three days -urine culture GNR UA shows positive WBC, dehydrated leukocyte*with cloudy urine -no fever, white count normal -patient is can alert. She at baseline does not communicate, she needs help for all her ADLs.--- Per daughter her dementia is progressively worsening  2. C1 fracture status post follow the past -continue collar  3. Right shoulder subluxation with chronic rotator cuff tear changes or x-ray -PRN pain meds -Dr. Scarlett Presto input appreciated -continue conservative management  4. Advanced dementia with progressive worsening according to the daughter -patient is long-term at [facility] she is dependent on all her ADLs -at baseline she does not communicate much -pt is a DNR prior to admission. This was confirmed with patient's daughter Monica Downs -patient will return back to [place] at discharge  5. Hypokalemia replete with IV potassium  6. Hypothyroidism continue Synthroid  7. History of coronary artery disease status post stent in the past -elevated troponin mildly appears demand ischemia -EKG does not show any acute ST elevation her depression -echo ordered results pending  8. COVID-19 positive about two weeks  ago -COVID test repeat negative    Family communication : Monica Downs on the phone Consults : Dr. Lutricia Horsfall orthopedic Discharge Disposition : [facility] today CODE STATUS: DNR  prior to admission DVT Prophylaxis : Lovenox CONSULTS OBTAINED:  Treatment Team:  Donato Heinz, MD  DRUG ALLERGIES:   Allergies  Allergen Reactions  . Azithromycin Anaphylaxis  . Hydrocodone-Acetaminophen Anaphylaxis  . Mobic [Meloxicam] Swelling  . Ondansetron Hcl Other (See Comments) and Swelling    BLOOD CLOT;caused cardiac arrest BLOOD CLOT;caused cardiac arrest  . Penicillins Shortness Of Breath    Has patient had a PCN reaction causing immediate rash, facial/tongue/throat swelling, SOB or lightheadedness with hypotension: Yes Has patient had a PCN reaction causing severe rash involving mucus membranes or skin necrosis: No Has patient had a PCN reaction that required hospitalization: No Has patient had a PCN reaction occurring within the last 10 years: No If all of the above answers are "NO", then may proceed with Cephalosporin use.   Marland Kitchen Percocet [Oxycodone-Acetaminophen] Anaphylaxis  . Reclast [Zoledronic Acid] Swelling  . Vicodin [Hydrocodone-Acetaminophen] Anaphylaxis  . Codeine Other (See Comments)    Went into cardiac arrest at [facility] with this after surgery  . Ibandronic Acid   . Oxycodone Swelling    Swelling of tongue    DISCHARGE MEDICATIONS:   Allergies as of 09/05/2019      Reactions   Azithromycin Anaphylaxis   Hydrocodone-acetaminophen Anaphylaxis   Mobic [meloxicam] Swelling   Ondansetron Hcl Other (See Comments), Swelling   BLOOD CLOT;caused cardiac arrest BLOOD CLOT;caused cardiac arrest   Penicillins Shortness Of Breath   Has patient had a PCN reaction causing immediate rash, facial/tongue/throat swelling, SOB or lightheadedness with hypotension: Yes Has patient had a PCN reaction causing severe rash involving mucus membranes or skin necrosis: No Has patient had a PCN reaction that required hospitalization: No Has patient had a PCN reaction occurring within the last 10 years: No If all of the above answers are "NO", then may proceed with  Cephalosporin use.   Percocet [oxycodone-acetaminophen] Anaphylaxis   Reclast [zoledronic Acid] Swelling   Vicodin [hydrocodone-acetaminophen] Anaphylaxis   Codeine Other (See Comments)   Went into cardiac arrest at [facility] with this after surgery   Ibandronic Acid    Oxycodone Swelling   Swelling of tongue      Medication List    STOP taking these medications   divalproex 250 MG 24 hr tablet Commonly known as: DEPAKOTE ER   QUEtiapine 25 MG tablet Commonly known as: SEROquel   venlafaxine 75 MG tablet Commonly known as: EFFEXOR   venlafaxine XR 75 MG 24 hr capsule Commonly known as: EFFEXOR-XR     TAKE these medications   aspirin 81 MG tablet Take 81 mg by mouth daily.   atorvastatin 40 MG tablet Commonly known as: LIPITOR TAKE ONE TABLET BY MOUTH EVERY DAY   Azelastine HCl 137 MCG/SPRAY Soln Place 1 spray into both nostrils 2 (two) times daily.   denosumab 60 MG/ML Soln injection Commonly known as: PROLIA Inject 60 mg into the skin every 6 (six) months. Administer in upper arm, thigh, or abdomen   donepezil 10 MG tablet Commonly known as: ARICEPT Take 10 mg by mouth every morning.   Ensure Take 237 mLs by mouth 2 (two) times daily between meals.   ferrous sulfate 325 (65 FE) MG tablet Take 325 mg by mouth daily with breakfast.   fluticasone 50 MCG/ACT nasal spray Commonly known as: FLONASE Place 1 spray into both  nostrils daily as needed for allergies or rhinitis (congestion).   levothyroxine 75 MCG tablet Commonly known as: SYNTHROID Take 75 mcg by mouth daily before breakfast.   miconazole 2 % cream Commonly known as: MICOTIN Apply 1 application topically See admin instructions. Apply to sacrum topically every shift for stage 2 ulcer.   multivitamin with minerals Tabs tablet Take 1 tablet by mouth daily. Start taking on: September 06, 2019   OLANZapine 5 MG tablet Commonly known as: ZYPREXA Take 5 mg by mouth 2 (two) times daily.   PROSTAT  PO Take 30 mLs by mouth 2 (two) times daily.   traZODone 50 MG tablet Commonly known as: DESYREL Take 50 mg by mouth at bedtime.   Vitamin D3 25 MCG (1000 UT) Caps Take 6,000 Units by mouth daily.       If you experience worsening of your admission symptoms, develop shortness of breath, life threatening emergency, suicidal or homicidal thoughts you must seek medical attention immediately by calling 911 or calling your MD immediately  if symptoms less severe.  You Must read complete instructions/literature along with all the possible adverse reactions/side effects for all the Medicines you take and that have been prescribed to you. Take any new Medicines after you have completely understood and accept all the possible adverse reactions/side effects.   Please note  You were cared for by a hospitalist during your hospital stay. If you have any questions about your discharge medications or the care you received while you were in the hospital after you are discharged, you can call the unit and asked to speak with the hospitalist on call if the hospitalist that took care of you is not available. Once you are discharged, your primary care physician will handle any further medical issues. Please note that NO REFILLS for any discharge medications will be authorized once you are discharged, as it is imperative that you return to your primary care physician (or establish a relationship with a primary care physician if you do not have one) for your aftercare needs so that they can reassess your need for medications and monitor your lab values. Today   SUBJECTIVE   Patient is awake alert. She drank water when offered to her. Baseline dementia does not communicate  VITAL SIGNS:  Blood pressure 93/63, pulse 67, temperature 98.4 F (36.9 C), temperature source Oral, resp. rate 16, height 5\' 3"  (1.6 m), weight 45 kg, SpO2 96 %.  I/O:    Intake/Output Summary (Last 24 hours) at 09/05/2019 1347 Last  data filed at 09/05/2019 0600 Gross per 24 hour  Intake 1277.23 ml  Output -  Net 1277.23 ml    PHYSICAL EXAMINATION:  GENERAL:  78 y.o.-year-old patient lying in the bed with no acute distress.   HEENT: Head atraumatic, normocephalic. Oropharynx and nasopharynx clear.  NECK:  Supple, no jugular venous distention. No thyroid enlargement, no tenderness. Cervical collar+ LUNGS: Normal breath sounds bilaterally, no wheezing, rales,rhonchi or crepitation. No use of accessory muscles of respiration.  CARDIOVASCULAR: S1, S2 normal. No murmurs, rubs, or gallops.  ABDOMEN: Soft, non-tender, non-distended. Bowel sounds present. No organomegaly or mass.  EXTREMITIES: No pedal edema, cyanosis, or clubbing.  NEUROLOGIC: grossly nonfocal. PSYCHIATRIC: The patient is alert and and shared baseline SKIN: RN  DATA REVIEW:   CBC  Recent Labs  Lab 09/04/19 0627  WBC 8.6  HGB 11.1*  HCT 34.4*  PLT 258    Chemistries  Recent Labs  Lab 09/04/19 0627  NA 143  K 3.2*  CL 105  CO2 28  GLUCOSE 86  BUN 14  CREATININE 0.54  CALCIUM 7.7*  AST 30  ALT 15  ALKPHOS 81  BILITOT 0.8    Microbiology Results   Recent Results (from the past 240 hour(s))  Urine culture     Status: Abnormal (Preliminary result)   Collection Time: 09/03/19  4:41 PM   Specimen: Urine, Random  Result Value Ref Range Status   Specimen Description   Final    URINE, RANDOM Performed at [facility], [address] [place], [place] [address]    Special Requests   Final    NONE Performed at [facility], [address] [place], [place] [address]    Culture (A)  Final    >=100,000 COLONIES/mL KLEBSIELLA PNEUMONIAE SUSCEPTIBILITIES TO FOLLOW Performed at [facility], [address], [place], [place] [address]    Report Status PENDING  Incomplete  SARS CORONAVIRUS 2 (TAT 6-24 HRS) Nasopharyngeal Nasopharyngeal Swab     Status: None   Collection Time: 09/03/19  9:37 PM   Specimen:  Nasopharyngeal Swab  Result Value Ref Range Status   SARS Coronavirus 2 NEGATIVE NEGATIVE Final    Comment: (NOTE) SARS-CoV-2 target nucleic acids are NOT DETECTED. The SARS-CoV-2 RNA is generally detectable in upper and lower respiratory specimens during the acute phase of infection. Negative results do not preclude SARS-CoV-2 infection, do not rule out co-infections with other pathogens, and should not be used as the sole basis for treatment or other patient management decisions. Negative results must be combined with clinical observations, patient history, and epidemiological information. The expected result is Negative. Fact Sheet for Patients: HairSlick.no Fact Sheet for Healthcare Providers: quierodirigir.com This test is not yet approved or cleared by the [place] FDA and  has been authorized for detection and/or diagnosis of SARS-CoV-2 by FDA under an Emergency Use Authorization (EUA). This EUA will remain  in effect (meaning this test can be used) for the duration of the COVID-19 declaration under Section 56 4(b)(1) of the Act, 21 U.S.C. section 360bbb-3(b)(1), unless the authorization is terminated or revoked sooner. Performed at [facility], [address], [place], [place] [address]     RADIOLOGY:  Dg Chest 1 View  Result Date: 09/03/2019 CLINICAL DATA:  Head trauma secondary to a fall. COVID-19. EXAM: CHEST  1 VIEW COMPARISON:  01/17/2019 FINDINGS: The heart size and pulmonary vascularity are normal. Aortic atherosclerosis. Multiple old healed right rib fractures. The right humeral head appears superiorly subluxed or dislocated. This may be projectional rather than a true dislocation or subluxation. No other acute bone abnormality. IMPRESSION: 1. No acute cardiopulmonary abnormalities. 2.  Aortic Atherosclerosis (ICD10-I70.0). 3. Possible superior subluxation or dislocation of the right humeral head. This  may be projectional. Electronically Signed   By: Francene Boyers M.D.   On: 09/03/2019 17:45   Dg Shoulder 1v Right  Result Date: 09/03/2019 CLINICAL DATA:  Right shoulder subluxation after a fall. EXAM: RIGHT SHOULDER - 1 VIEW 8:35 p.m. COMPARISON:  Radiographs dated 09/03/2019 6:31 p.m. FINDINGS: There is superior subluxation of the humeral head with respect of the glenoid with articulation with the undersurface of the acromion, likely due to a chronic full-thickness rotator cuff tear. No appreciable dislocation at this time. This appears appreciably different than on the chest x-ray of 09/03/2019 at 5:25 p.m. IMPRESSION: 1. Chronic full-thickness rotator cuff tear with superior subluxation of the humeral head with respect of the glenoid. 2.  No discrete dislocation. Electronically Signed   By: Francene Boyers M.D.   On: 09/03/2019 20:52   Dg Shoulder Right  Result Date: 09/03/2019 CLINICAL DATA:  Right shoulder subluxation or dislocation EXAM: RIGHT SHOULDER - 2+ VIEW COMPARISON:  None. FINDINGS: The humeral head appears to be anterior and superiorly subluxed in relation to the glenoid surface. No definite fracture seen. There is diffuse osteopenia. AC joint arthrosis is noted. IMPRESSION: Anterior superior subluxation of the humeral head. No definite fracture. Electronically Signed   By: Jonna Clark M.D.   On: 09/03/2019 18:56   Ct Head Wo Contrast  Result Date: 09/03/2019 CLINICAL DATA:  78 year old female with head trauma. EXAM: CT HEAD WITHOUT CONTRAST CT MAXILLOFACIAL WITHOUT CONTRAST CT CERVICAL SPINE WITHOUT CONTRAST TECHNIQUE: Multidetector CT imaging of the head, cervical spine, and maxillofacial structures were performed using the standard protocol without intravenous contrast. Multiplanar CT image reconstructions of the cervical spine and maxillofacial structures were also generated. COMPARISON:  Head CT dated 04/17/2017. FINDINGS: CT HEAD FINDINGS Brain: There is mild age-related atrophy and  chronic microvascular ischemic changes. There is no acute intracranial hemorrhage. No mass effect or midline shift. No extra-axial fluid collection. Vascular: No hyperdense vessel or unexpected calcification. Skull: Normal. Negative for fracture or focal lesion. Other: Mild right posterior scalp contusion. CT MAXILLOFACIAL FINDINGS Osseous: No acute fracture. No mandibular subluxation. Bilateral TMJ disease. Orbits: Negative. No traumatic or inflammatory finding. Sinuses: Clear. Soft tissues: Negative. CT CERVICAL SPINE FINDINGS Alignment: No acute subluxation. Grade 1 C4-C5 anterolisthesis. Skull base and vertebrae: There is nondisplaced fracture of the anterior ring of C1. No other acute fracture identified. The bones are osteopenic. Soft tissues and spinal canal: No prevertebral fluid or swelling. No visible canal hematoma. Disc levels: Multilevel degenerative changes with endplate irregularity and disc space narrowing most prominent at C4-C5, C5-C6, and C6-C7. Upper chest: A 4 mm right upper lobe subpleural nodule. Other: Bilateral carotid bulb calcified plaques. IMPRESSION: 1. No acute intracranial hemorrhage. Mild age-related atrophy and chronic microvascular ischemic changes. 2. Nondisplaced fracture of the anterior ring of C1. 3. No acute facial bone fractures. These results were called by telephone at the time of interpretation on 09/03/2019 at 5:35 pm to provider [facility] , who verbally acknowledged these results. Electronically Signed   By: Elgie Collard M.D.   On: 09/03/2019 17:49   Ct Cervical Spine Wo Contrast  Result Date: 09/03/2019 CLINICAL DATA:  78 year old female with head trauma. EXAM: CT HEAD WITHOUT CONTRAST CT MAXILLOFACIAL WITHOUT CONTRAST CT CERVICAL SPINE WITHOUT CONTRAST TECHNIQUE: Multidetector CT imaging of the head, cervical spine, and maxillofacial structures were performed using the standard protocol without intravenous contrast. Multiplanar CT image reconstructions of the  cervical spine and maxillofacial structures were also generated. COMPARISON:  Head CT dated 04/17/2017. FINDINGS: CT HEAD FINDINGS Brain: There is mild age-related atrophy and chronic microvascular ischemic changes. There is no acute intracranial hemorrhage. No mass effect or midline shift. No extra-axial fluid collection. Vascular: No hyperdense vessel or unexpected calcification. Skull: Normal. Negative for fracture or focal lesion. Other: Mild right posterior scalp contusion. CT MAXILLOFACIAL FINDINGS Osseous: No acute fracture. No mandibular subluxation. Bilateral TMJ disease. Orbits: Negative. No traumatic or inflammatory finding. Sinuses: Clear. Soft tissues: Negative. CT CERVICAL SPINE FINDINGS Alignment: No acute subluxation. Grade 1 C4-C5 anterolisthesis. Skull base and vertebrae: There is nondisplaced fracture of the anterior ring of C1. No other acute fracture identified. The bones are osteopenic. Soft tissues and spinal canal: No prevertebral fluid or swelling. No visible  canal hematoma. Disc levels: Multilevel degenerative changes with endplate irregularity and disc space narrowing most prominent at C4-C5, C5-C6, and C6-C7. Upper chest: A 4 mm right upper lobe subpleural nodule. Other: Bilateral carotid bulb calcified plaques. IMPRESSION: 1. No acute intracranial hemorrhage. Mild age-related atrophy and chronic microvascular ischemic changes. 2. Nondisplaced fracture of the anterior ring of C1. 3. No acute facial bone fractures. These results were called by telephone at the time of interpretation on 09/03/2019 at 5:35 pm to provider [facility] , who verbally acknowledged these results. Electronically Signed   By: Elgie Collard M.D.   On: 09/03/2019 17:49   Ct Maxillofacial Wo Contrast  Result Date: 09/03/2019 CLINICAL DATA:  78 year old female with head trauma. EXAM: CT HEAD WITHOUT CONTRAST CT MAXILLOFACIAL WITHOUT CONTRAST CT CERVICAL SPINE WITHOUT CONTRAST TECHNIQUE: Multidetector CT imaging of  the head, cervical spine, and maxillofacial structures were performed using the standard protocol without intravenous contrast. Multiplanar CT image reconstructions of the cervical spine and maxillofacial structures were also generated. COMPARISON:  Head CT dated 04/17/2017. FINDINGS: CT HEAD FINDINGS Brain: There is mild age-related atrophy and chronic microvascular ischemic changes. There is no acute intracranial hemorrhage. No mass effect or midline shift. No extra-axial fluid collection. Vascular: No hyperdense vessel or unexpected calcification. Skull: Normal. Negative for fracture or focal lesion. Other: Mild right posterior scalp contusion. CT MAXILLOFACIAL FINDINGS Osseous: No acute fracture. No mandibular subluxation. Bilateral TMJ disease. Orbits: Negative. No traumatic or inflammatory finding. Sinuses: Clear. Soft tissues: Negative. CT CERVICAL SPINE FINDINGS Alignment: No acute subluxation. Grade 1 C4-C5 anterolisthesis. Skull base and vertebrae: There is nondisplaced fracture of the anterior ring of C1. No other acute fracture identified. The bones are osteopenic. Soft tissues and spinal canal: No prevertebral fluid or swelling. No visible canal hematoma. Disc levels: Multilevel degenerative changes with endplate irregularity and disc space narrowing most prominent at C4-C5, C5-C6, and C6-C7. Upper chest: A 4 mm right upper lobe subpleural nodule. Other: Bilateral carotid bulb calcified plaques. IMPRESSION: 1. No acute intracranial hemorrhage. Mild age-related atrophy and chronic microvascular ischemic changes. 2. Nondisplaced fracture of the anterior ring of C1. 3. No acute facial bone fractures. These results were called by telephone at the time of interpretation on 09/03/2019 at 5:35 pm to provider [facility] , who verbally acknowledged these results. Electronically Signed   By: Elgie Collard M.D.   On: 09/03/2019 17:49     CODE STATUS:     Code Status Orders  (From admission, onward)          Start     Ordered   09/03/19 2303  Do not attempt resuscitation (DNR)  Continuous    Question Answer Comment  In the event of cardiac or respiratory ARREST Do not call a "code blue"   In the event of cardiac or respiratory ARREST Do not perform Intubation, CPR, defibrillation or ACLS   In the event of cardiac or respiratory ARREST Use medication by any route, position, wound care, and other measures to relive pain and suffering. May use oxygen, suction and manual treatment of airway obstruction as needed for comfort.      09/03/19 2302        Code Status History    Date Active Date Inactive Code Status Order ID Comments User Context   09/03/2019 2148 09/03/2019 2302 Full Code [id number]  Pearson Grippe, MD ED   Advance Care Planning Activity       TOTAL TIME TAKING CARE OF THIS PATIENT: *40* minutes.  Enedina Finner M.D on 09/05/2019 at 1:47 PM  Between 7am to 6pm - Pager - [phone] After 6pm go to [email or web address] - password [email or web address]  [facility]    CC: Primary care physician; Whitaker, [employer], PA-C

## 2019-09-05 NOTE — Discharge Instructions (Signed)
DYSPHAGIA 3 diet Cont Strawberry ensure

## 2019-09-06 LAB — URINE CULTURE: Culture: >=100000 — AB

## 2019-09-22 ENCOUNTER — Emergency Department: Payer: Medicare Other

## 2019-09-22 ENCOUNTER — Emergency Department
Admission: EM | Admit: 2019-09-22 | Discharge: 2019-09-22 | Disposition: A | Discharge status: Home / Self Care | Payer: Medicare Other | Source: Home / Self Care | Attending: Emergency Medicine | Admitting: Emergency Medicine

## 2019-09-22 ENCOUNTER — Other Ambulatory Visit: Payer: Self-pay

## 2019-09-22 ENCOUNTER — Emergency Department
Admission: EM | Admit: 2019-09-22 | Discharge: 2019-09-22 | Disposition: A | Discharge status: Home / Self Care | Payer: Medicare Other | Attending: Emergency Medicine | Admitting: Emergency Medicine

## 2019-09-22 ENCOUNTER — Encounter: Payer: Self-pay | Admitting: Emergency Medicine

## 2019-09-22 DIAGNOSIS — W19XXXA Unspecified fall, initial encounter: Secondary | ICD-10-CM | POA: Insufficient documentation

## 2019-09-22 DIAGNOSIS — Z4802 Encounter for removal of sutures: Secondary | ICD-10-CM | POA: Insufficient documentation

## 2019-09-22 DIAGNOSIS — Z87891 Personal history of nicotine dependence: Secondary | ICD-10-CM | POA: Insufficient documentation

## 2019-09-22 DIAGNOSIS — S022XXA Fracture of nasal bones, initial encounter for closed fracture: Secondary | ICD-10-CM

## 2019-09-22 DIAGNOSIS — Z96659 Presence of unspecified artificial knee joint: Secondary | ICD-10-CM | POA: Insufficient documentation

## 2019-09-22 DIAGNOSIS — S022XXB Fracture of nasal bones, initial encounter for open fracture: Secondary | ICD-10-CM | POA: Diagnosis not present

## 2019-09-22 DIAGNOSIS — S0990XA Unspecified injury of head, initial encounter: Secondary | ICD-10-CM

## 2019-09-22 DIAGNOSIS — E039 Hypothyroidism, unspecified: Secondary | ICD-10-CM | POA: Insufficient documentation

## 2019-09-22 DIAGNOSIS — I2510 Atherosclerotic heart disease of native coronary artery without angina pectoris: Secondary | ICD-10-CM | POA: Insufficient documentation

## 2019-09-22 DIAGNOSIS — S0101XA Laceration without foreign body of scalp, initial encounter: Secondary | ICD-10-CM | POA: Insufficient documentation

## 2019-09-22 DIAGNOSIS — Y92122 Bedroom in nursing home as the place of occurrence of the external cause: Secondary | ICD-10-CM | POA: Insufficient documentation

## 2019-09-22 DIAGNOSIS — F0390 Unspecified dementia without behavioral disturbance: Secondary | ICD-10-CM | POA: Insufficient documentation

## 2019-09-22 DIAGNOSIS — I10 Essential (primary) hypertension: Secondary | ICD-10-CM | POA: Insufficient documentation

## 2019-09-22 DIAGNOSIS — W19XXXD Unspecified fall, subsequent encounter: Secondary | ICD-10-CM

## 2019-09-22 DIAGNOSIS — Y999 Unspecified external cause status: Secondary | ICD-10-CM | POA: Insufficient documentation

## 2019-09-22 DIAGNOSIS — S0240CA Maxillary fracture, right side, initial encounter for closed fracture: Secondary | ICD-10-CM | POA: Diagnosis not present

## 2019-09-22 DIAGNOSIS — Z79899 Other long term (current) drug therapy: Secondary | ICD-10-CM | POA: Insufficient documentation

## 2019-09-22 DIAGNOSIS — Z7982 Long term (current) use of aspirin: Secondary | ICD-10-CM | POA: Insufficient documentation

## 2019-09-22 DIAGNOSIS — Y92129 Unspecified place in nursing home as the place of occurrence of the external cause: Secondary | ICD-10-CM | POA: Insufficient documentation

## 2019-09-22 DIAGNOSIS — Y939 Activity, unspecified: Secondary | ICD-10-CM | POA: Insufficient documentation

## 2019-09-22 DIAGNOSIS — S02401A Maxillary fracture, unspecified, initial encounter for closed fracture: Secondary | ICD-10-CM

## 2019-09-22 DIAGNOSIS — T1490XA Injury, unspecified, initial encounter: Secondary | ICD-10-CM

## 2019-09-22 LAB — CBC WITH DIFFERENTIAL/PLATELET
Abs Immature Granulocytes: 0.16 10*3/uL — ABNORMAL HIGH (ref 0.00–0.07)
Basophils Absolute: 0.1 10*3/uL (ref 0.0–0.1)
Basophils Relative: 0 %
Eosinophils Absolute: 0.1 10*3/uL (ref 0.0–0.5)
Eosinophils Relative: 1 %
HCT: 30.6 % — ABNORMAL LOW (ref 36.0–46.0)
Hemoglobin: 10.3 g/dL — ABNORMAL LOW (ref 12.0–15.0)
Immature Granulocytes: 1 %
Lymphocytes Relative: 8 %
Lymphs Abs: 1.3 10*3/uL (ref 0.7–4.0)
MCH: 29.1 pg (ref 26.0–34.0)
MCHC: 33.7 g/dL (ref 30.0–36.0)
MCV: 86.4 fL (ref 80.0–100.0)
Monocytes Absolute: 1 10*3/uL (ref 0.1–1.0)
Monocytes Relative: 6 %
Neutro Abs: 15.1 10*3/uL — ABNORMAL HIGH (ref 1.7–7.7)
Neutrophils Relative %: 84 %
Platelets: 251 10*3/uL (ref 150–400)
RBC: 3.54 MIL/uL — ABNORMAL LOW (ref 3.87–5.11)
RDW: 14.3 % (ref 11.5–15.5)
WBC: 17.8 10*3/uL — ABNORMAL HIGH (ref 4.0–10.5)
nRBC: 0 % (ref 0.0–0.2)

## 2019-09-22 LAB — TROPONIN I (HIGH SENSITIVITY): Troponin I (High Sensitivity): 4 ng/L (ref <18)

## 2019-09-22 LAB — BASIC METABOLIC PANEL
Anion gap: 11 (ref 5–15)
BUN: 15 mg/dL (ref 8–23)
CO2: 32 mmol/L (ref 22–32)
Calcium: 8.2 mg/dL — ABNORMAL LOW (ref 8.9–10.3)
Chloride: 95 mmol/L — ABNORMAL LOW (ref 98–111)
Creatinine, Ser: 0.61 mg/dL (ref 0.44–1.00)
GFR calc Af Amer: >60 mL/min (ref >60)
GFR calc non Af Amer: >60 mL/min (ref >60)
Glucose, Bld: 111 mg/dL — ABNORMAL HIGH (ref 70–99)
Potassium: 3.6 mmol/L (ref 3.5–5.1)
Sodium: 138 mmol/L (ref 135–145)

## 2019-09-22 MED ORDER — ACETAMINOPHEN 500 MG PO TABS: 1000.0000 mg | ORAL_TABLET | Freq: Once | ORAL | Status: DC

## 2019-09-22 MED ORDER — LIDOCAINE-EPINEPHRINE 2 %-1:100000 IJ SOLN
20.0000 mL | Freq: Once | INTRAMUSCULAR | Status: DC
  Filled 2019-09-22: qty 1

## 2019-09-22 MED ORDER — LORAZEPAM 2 MG/ML IJ SOLN
0.5000 mg | Freq: Once | INTRAMUSCULAR | Status: AC
  Administered 2019-09-22: 0.5 mg via INTRAVENOUS
  Filled 2019-09-22: qty 1

## 2019-09-22 MED ORDER — LIDOCAINE 5 % EX PTCH
1.0000 | MEDICATED_PATCH | CUTANEOUS | Status: DC
  Administered 2019-09-22: 1 via TRANSDERMAL
  Filled 2019-09-22: qty 1

## 2019-09-22 MED ORDER — DOXYCYCLINE HYCLATE 100 MG PO CAPS: 100.0000 mg | ORAL_CAPSULE | Freq: Two times a day (BID) | ORAL | 0 refills | Status: AC

## 2019-09-22 NOTE — ED Notes (Signed)
Cleaned patient and changed linen soiled with feces tech Marcie Bal helped.AS

## 2019-09-22 NOTE — ED Notes (Addendum)
Pt unable to  verbalize understanding of discharge instructions and sign for discharge due AMS. Discharge instructions sent with EMS for review by facility who was informed of pt's return. NAD at this time.

## 2019-09-22 NOTE — ED Triage Notes (Signed)
Pt to ED by EMS after an unwitnessed fall. Pt with new laceration to right side of forehead and hematoma to right eye.

## 2019-09-22 NOTE — ED Notes (Signed)
EMS called for transport by ED secretary Merton Border

## 2019-09-22 NOTE — ED Notes (Signed)
Informed daughter Jeannene Patella who is the patient's POA that patient will be discharged back to the facility, daughter states she is going to call the facility to inform them that they need to make sure she has padding up to protect the patient.

## 2019-09-22 NOTE — ED Triage Notes (Signed)
Patient to rm 26 via EMS from local nsg home for an unwitnessed fall.  Patient with blood on face especially across nose.  Dried blood noted to right side of hair.  Patient with multiple bruising and abrasion in varying stages of healing.

## 2019-09-22 NOTE — ED Notes (Signed)
Attempted to call [facility] to inform them the patient would be returning, no answer at facility.  Daughter did state that she would call the facility to make sure her room was ready for her when she returned.

## 2019-09-22 NOTE — ED Provider Notes (Signed)
[facility] Emergency Department Provider Note   ____________________________________________   First MD Initiated Contact with Patient 09/22/19 [phone]     (approximate)  I have reviewed the triage vital signs and the nursing notes.   HISTORY  Chief Complaint Fall    HPI Monica Downs is a 78 y.o. female with past medical history of dementia, CAD, hypertension, and hypothyroidism presents to the ED following fall.  History is limited given patient's dementia, she did he is minimally verbally interactive at baseline.  Per EMS, [facility] resides [facility] and was found down on the ground this morning beside her bed by staff.  Fall was but not witnessed, but staff noted significant bleeding and swelling from around her nose.  They had stated she is at her baseline mental status and had otherwise been doing well with no recent fevers or other complaints.  She is not anticoagulated.        Past Medical History:  Diagnosis Date  . Allergic rhinitis   . Anxiety   . CAD (coronary artery disease)    a. 07/2007 Cath/PCI: LM nl, LAD 99p (3.0 x 18 Xience EES), D1 90ost, LCX nl, RCA nl;  b. 09/2014 MV: EF 72%, attenuation artifact, no ischemia.  . Clotting disorder ([place])    LEGS;throat  . Falls   . History of subarachnoid hemorrhage    a. 04/2016 In setting of fall-->traumatic brain injury.  Marland Kitchen HLD (hyperlipidemia)   . Hypertension   . Hypothyroidism   . Incontinence   . Left Forearm AV Fistula    a. Asymptomatic.  No blood draws/IV's;  b. 06/2013 confirmed by L FA duplex.  . Mitral regurgitation    a. 02/2011 Echo: EF 72%, trace TR, mild MR/AI; b. 01/2018 Echo: EF 60-65%, no rwma, Gr2 DD, mild AI/MR. Nl RV fxn. Mildly dil RV/RA. Mod TR. Nl PASP.  Marland Kitchen Obstructive sleep apnea   . Panic attacks   . Traumatic brain injury ([place])    a. 04/2016 in setting of fall/SAH.    Patient Active Problem List   Diagnosis Date Noted  . Altered mental status 09/04/2019  .  Acute cystitis with hematuria   . Nondisplaced posterior arch fracture of first cervical vertebra with routine healing   . COVID-19   . Fall   . Hypokalemia 09/03/2019  . Acute lower UTI 09/03/2019  . AMS (altered mental status) 09/03/2019  . Elevated troponin 09/03/2019  . Unspecified dementia with behavioral disturbance ([place]) 01/02/2019  . Traumatic subarachnoid hemorrhage ([place]) 04/11/2016  . 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 CAD (coronary artery disease)   . HLD (hyperlipidemia)     Past Surgical History:  Procedure Laterality Date  . BUNIONETTE EXCISION    . CARDIAC CATHETERIZATION  07/2007   99% mid LAD stenosis  . CORONARY STENT PLACEMENT  07/2007   Mid LAD: 3.0 X 18 mm Xience stent  . MULTIPLE TOOTH EXTRACTIONS    . THYROIDECTOMY    . TOTAL KNEE ARTHROPLASTY     left    Prior to Admission medications   Medication Sig Start Date End Date Taking? Authorizing Provider  aspirin 81 MG tablet Take 81 mg by mouth daily.     [provider]  atorvastatin (LIPITOR) 40 MG tablet TAKE ONE TABLET BY MOUTH EVERY DAY Patient taking differently: Take 40 mg by mouth daily.  12/14/18   Wellington Hampshire, MD  Azelastine HCl 137 MCG/SPRAY  SOLN Place 1 spray into both nostrils 2 (two) times daily.    [provider]  Cholecalciferol (VITAMIN D3) 1000 units CAPS Take 6,000 Units by mouth daily.     [provider]  denosumab (PROLIA) 60 MG/ML SOLN injection Inject 60 mg into the skin every 6 (six) months. Administer in upper arm, thigh, or abdomen    [provider]  donepezil (ARICEPT) 10 MG tablet Take 10 mg by mouth every morning.  09/02/16 09/04/19  [provider]  doxycycline (VIBRAMYCIN) 100 MG capsule Take 1 capsule (100 mg total) by mouth 2 (two) times daily for 7 days. 09/22/19 09/29/19  Chesley Noon, MD  ENSURE (ENSURE) Take 237 mLs by mouth 2 (two) times daily between meals.    [provider]  ferrous sulfate 325 (65 FE) MG tablet Take 325 mg by mouth daily with breakfast.    [provider]  fluticasone (FLONASE) 50 MCG/ACT nasal spray Place 1 spray into both nostrils daily as needed for allergies or rhinitis (congestion).    [provider]  levothyroxine (SYNTHROID, LEVOTHROID) 75 MCG tablet Take 75 mcg by mouth daily before breakfast.  01/08/16   [provider]  miconazole (MICOTIN) 2 % cream Apply 1 application topically See admin instructions. Apply to sacrum topically every shift for stage 2 ulcer.    [provider]  Multiple Vitamin (MULTIVITAMIN WITH MINERALS) TABS tablet Take 1 tablet by mouth daily. 09/06/19   Enedina Finner, MD  OLANZapine (ZYPREXA) 5 MG tablet Take 5 mg by mouth 2 (two) times daily.    [provider]  Pollen Extracts (PROSTAT PO) Take 30 mLs by mouth 2 (two) times daily.    [provider]  traZODone (DESYREL) 50 MG tablet Take 50 mg by mouth at bedtime.    [provider]    Allergies Azithromycin, Hydrocodone-acetaminophen, Mobic [meloxicam], Ondansetron hcl, Penicillins, Percocet [oxycodone-acetaminophen], Reclast [zoledronic acid], Vicodin [hydrocodone-acetaminophen], Codeine, Ibandronic acid, and Oxycodone  Family History  Problem Relation Age of Onset  . Heart disease Mother   . Hyperlipidemia Mother   . Tuberculosis Mother   . Colon cancer Father   . Colon cancer Other   . Lymphoma Other   . Heart disease Other   . Breast cancer Neg Hx     Social History Social History   Tobacco Use  . Smoking status: Former Smoker    Packs/day: 0.10    Years: 2.00    Pack years: 0.20    Types: Cigarettes    Quit date: 10/04/1991    Years since quitting: 27.9  . Smokeless tobacco: Never Used  . Tobacco comment: smoked on and off  Substance Use Topics  . Alcohol use: No  . Drug use: No    Review of Systems Unable to obtain secondary to  dementia  ____________________________________________   PHYSICAL EXAM:  VITAL SIGNS: ED Triage Vitals  Enc Vitals Group     BP 09/22/19 0506 100/61     Pulse Rate 09/22/19 0506 83     Resp 09/22/19 0506 12     Temp 09/22/19 0506 99 F (37.2 C)     Temp Source 09/22/19 0506 Axillary     SpO2 09/22/19 0506 100 %     Weight 09/22/19 0505 110 lb 11.2 oz (50.2 kg)     Height 09/22/19 0505  (1.6 m)     Head Circumference --      Peak Flow --      Pain  Score --      Pain Loc --      Pain Edu? --      Excl. in GC? --     Constitutional: Alert with mumbling speech Eyes: Conjunctivae are normal. Head: Small to hematoma and old sutured wound to right temple, well-healed.  Dried blood to posterior scalp with no obvious laceration, no scalp step-offs. Nose: Laceration to the bridge of nose with obvious deformity and associated edema, dried blood to bilateral nares with no septal hematoma noted. Mouth/Throat: Mucous membranes are moist. Neck: Normal ROM Cardiovascular: Normal rate, regular rhythm. Grossly normal heart sounds. Respiratory: Normal respiratory effort.  No retractions. Lungs CTAB.  Anterior chest wall tenderness. Gastrointestinal: Soft and nontender. No distention. Genitourinary: deferred Musculoskeletal: No lower extremity tenderness nor edema.  Pelvis stable and without tenderness.  No tenderness along bilateral upper extremities and shoulders. Neurologic: Mumbling speech. No gross focal neurologic deficits are appreciated, moving all extremities. Skin:  Skin is warm, dry and intact. No rash noted. Psychiatric: Mood and affect are normal. Speech and behavior are normal.  ____________________________________________   LABS (all labs ordered are listed, but only abnormal results are displayed)  Labs Reviewed  BASIC METABOLIC PANEL - Abnormal; Notable for the following components:      Result Value   Chloride 95 (*)    Glucose, Bld 111 (*)    Calcium 8.2 (*)     All other components within normal limits  CBC WITH DIFFERENTIAL/PLATELET - Abnormal; Notable for the following components:   WBC 17.8 (*)    RBC 3.54 (*)    Hemoglobin 10.3 (*)    HCT 30.6 (*)    Neutro Abs 15.1 (*)    Abs Immature Granulocytes 0.16 (*)    All other components within normal limits  TROPONIN I (HIGH SENSITIVITY)   ____________________________________________  EKG  ED ECG REPORT I, Chesley Noon, the attending physician, personally viewed and interpreted this ECG.   Date: 09/22/2019  EKG Time: 5:04  Rate: 81  Rhythm: normal sinus rhythm  Axis: Normal  Intervals:none  ST&T Change: None   PROCEDURES  Procedure(s) performed (including Critical Care):  Marland KitchenMarland KitchenLaceration Repair  Date/Time: 09/22/2019 6:11 AM Performed by: Chesley Noon, MD Authorized by: Chesley Noon, MD   Consent:    Consent obtained:  Verbal   Consent given by:  Patient and healthcare agent Anesthesia (see MAR for exact dosages):    Anesthesia method:  Local infiltration   Local anesthetic:  Lidocaine 1% WITH epi Laceration details:    Location:  Face   Face location:  Nose   Length (cm):  2 Repair type:    Repair type:  Simple Pre-procedure details:    Preparation:  Patient was prepped and draped in usual sterile fashion and imaging obtained to evaluate for foreign bodies Exploration:    Wound exploration: wound explored through full range of motion     Contaminated: no   Treatment:    Area cleansed with:  Saline   Amount of cleaning:  Standard   Irrigation solution:  Sterile saline   Irrigation method:  Pressure wash   Visualized foreign bodies/material removed: no   Skin repair:    Repair method:  Sutures   Suture size:  5-0   Suture material:  Nylon   Suture technique:  Simple interrupted   Number of sutures:  4 Approximation:    Approximation:  Loose Post-procedure details:    Dressing:  Antibiotic ointment  .Suture Removal  Date/Time: 09/22/2019 7:17  AM Performed by: Chesley NoonJessup, Lyly Canizales, MD Authorized by: Chesley NoonJessup, Talita Recht, MD   Consent:    Consent obtained:  Verbal   Consent given by:  Guardian and patient Location:    Location:  Head/neck   Head/neck location:  Forehead Procedure details:    Wound appearance:  No signs of infection, good wound healing and clean   Number of sutures removed:  15 Post-procedure details:    Post-removal:  No dressing applied   Patient tolerance of procedure:  Tolerated well, no immediate complications     ____________________________________________   INITIAL IMPRESSION / ASSESSMENT AND PLAN / ED COURSE       78 year old female with history of dementia presents to the ED from nursing facility after being found down on the ground beside her bed with blood to her head and nose.  She is at her baseline mental status per EMS and upon review of chart it seems that she is minimally verbally interactive at baseline.  Description from her recent admission of her mental status appears consistent with her presentation today.  We will obtain imaging of her head, C-spine, maxillofacial CT, and chest x-ray.  She has old sutures in place to her right temple that are appropriate for removal given placement in the ED almost 3 weeks ago.  She also has laceration to the bridge of her nose with obvious deformity, will likely require repair.  Given no clear context of fall, will check EKG and labs.  Imaging is significant for bilateral nasal bone fractures with minimally displaced maxillary sinus fracture.  She continues to have bleeding from the laceration to the bridge of her nose, which was repaired with loose sutures with subsequent hemostasis.  CT head and C-spine are negative for acute process, C-spine CT does show subacute C1 fracture, which daughter states patient was just cleared from with removal of cervical collar.  With cleaning of dried blood to her scalp, no subsequent scalp laceration was visualized and it  appears that noted blood came from wound to her nose.  Lab work is pending, and if this is unremarkable she would be appropriate for discharge back to nursing facility.  Lab work shows leukocytosis but is otherwise unremarkable.  Vital signs not consistent with sepsis.  There is no evidence of infectious processes at this time, chest x-ray negative for acute process.  Patient continues to complain of soreness around her chest as well as at her nose, will apply Lidoderm patch and give dose of Tylenol.      ____________________________________________   FINAL CLINICAL IMPRESSION(S) / ED DIAGNOSES  Final diagnoses:  Fall, subsequent encounter  Open fracture of nasal bone, initial encounter  Closed fracture of maxillary sinus, initial encounter [facility](HCC)     ED Discharge Orders         Ordered    doxycycline (VIBRAMYCIN) 100 MG capsule  2 times daily     09/22/19 0708           Note:  This document was prepared using Dragon voice recognition software and may include unintentional dictation errors.   Chesley NoonJessup, Anais Koenen, MD 09/22/19 [phone]

## 2019-09-22 NOTE — ED Notes (Signed)
This RN spoke with [place] to inform them of pt's return by EMS.

## 2019-09-22 NOTE — ED Notes (Signed)
Patient transported to CT 

## 2019-09-22 NOTE — ED Provider Notes (Signed)
[facility] Emergency Department Provider Note   ____________________________________________    I have reviewed the triage vital signs and the nursing notes.   HISTORY  Chief Complaint Fall   History limited by severe dementia  HPI Monica Downs is a 78 y.o. female who presents after a fall.  Patient with frequent falls, brought in by EMS after unwitnessed fall.  Here recently for similar event.  Has known anterior C1 fracture, patient unable to provide any history  Past Medical History:  Diagnosis Date  . Allergic rhinitis   . Anxiety   . CAD (coronary artery disease)    a. 07/2007 Cath/PCI: LM nl, LAD 99p (3.0 x 18 Xience EES), D1 90ost, LCX nl, RCA nl;  b. 09/2014 MV: EF 72%, attenuation artifact, no ischemia.  . Clotting disorder ([facility])    LEGS;throat  . Falls   . History of subarachnoid hemorrhage    a. 04/2016 In setting of fall-->traumatic brain injury.  Marland Kitchen HLD (hyperlipidemia)   . Hypertension   . Hypothyroidism   . Incontinence   . Left Forearm AV Fistula    a. Asymptomatic.  No blood draws/IV's;  b. 06/2013 confirmed by L FA duplex.  . Mitral regurgitation    a. 02/2011 Echo: EF 72%, trace TR, mild MR/AI; b. 01/2018 Echo: EF 60-65%, no rwma, Gr2 DD, mild AI/MR. Nl RV fxn. Mildly dil RV/RA. Mod TR. Nl PASP.  Marland Kitchen Obstructive sleep apnea   . Panic attacks   . Traumatic brain injury ([facility])    a. 04/2016 in setting of fall/SAH.    Patient Active Problem List   Diagnosis Date Noted  . Altered mental status 09/04/2019  . Acute cystitis with hematuria   . Nondisplaced posterior arch fracture of first cervical vertebra with routine healing   . COVID-19   . Fall   . Hypokalemia 09/03/2019  . Acute lower UTI 09/03/2019  . AMS (altered mental status) 09/03/2019  . Elevated troponin 09/03/2019  . Unspecified dementia with behavioral disturbance ([facility]) 01/02/2019  . Traumatic subarachnoid hemorrhage ([facility]) 04/11/2016  . A-V fistula ([facility])  06/28/2013  . 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 CAD (coronary artery disease)   . HLD (hyperlipidemia)     Past Surgical History:  Procedure Laterality Date  . BUNIONETTE EXCISION    . CARDIAC CATHETERIZATION  07/2007   99% mid LAD stenosis  . CORONARY STENT PLACEMENT  07/2007   Mid LAD: 3.0 X 18 mm Xience stent  . MULTIPLE TOOTH EXTRACTIONS    . THYROIDECTOMY    . TOTAL KNEE ARTHROPLASTY     left    Prior to Admission medications   Medication Sig Start Date End Date Taking? Authorizing Provider  aspirin 81 MG tablet Take 81 mg by mouth daily.     [provider]  atorvastatin (LIPITOR) 40 MG tablet TAKE ONE TABLET BY MOUTH EVERY DAY Patient taking differently: Take 40 mg by mouth daily.  12/14/18   Iran Ouch, MD  Azelastine HCl 137 MCG/SPRAY SOLN Place 1 spray into both nostrils 2 (two) times daily.    [provider]  Cholecalciferol (VITAMIN D3) 1000 units CAPS Take 6,000 Units by mouth daily.     [provider]  denosumab (PROLIA) 60 MG/ML SOLN injection Inject 60 mg into the skin every 6 (six) months. Administer in upper arm, thigh, or abdomen    [provider]  donepezil (ARICEPT) 10 MG tablet Take 10 mg by mouth  every morning.  09/02/16 09/04/19  [provider]  doxycycline (VIBRAMYCIN) 100 MG capsule Take 1 capsule (100 mg total) by mouth 2 (two) times daily for 7 days. 09/22/19 09/29/19  Chesley NoonJessup, Charles, MD  ENSURE (ENSURE) Take 237 mLs by mouth 2 (two) times daily between meals.    [provider]  ferrous sulfate 325 (65 FE) MG tablet Take 325 mg by mouth daily with breakfast.    [provider]  fluticasone (FLONASE) 50 MCG/ACT nasal spray Place 1 spray into both nostrils daily as needed for allergies or rhinitis (congestion).    [provider]  levothyroxine (SYNTHROID, LEVOTHROID) 75 MCG tablet Take 75 mcg by mouth daily before breakfast.  01/08/16   [provider]    miconazole (MICOTIN) 2 % cream Apply 1 application topically See admin instructions. Apply to sacrum topically every shift for stage 2 ulcer.    [provider]  Multiple Vitamin (MULTIVITAMIN WITH MINERALS) TABS tablet Take 1 tablet by mouth daily. 09/06/19   Enedina FinnerPatel, Sona, MD  OLANZapine (ZYPREXA) 5 MG tablet Take 5 mg by mouth 2 (two) times daily.    [provider]  Pollen Extracts (PROSTAT PO) Take 30 mLs by mouth 2 (two) times daily.    [provider]  traZODone (DESYREL) 50 MG tablet Take 50 mg by mouth at bedtime.    [provider]     Allergies Azithromycin, Hydrocodone-acetaminophen, Mobic [meloxicam], Ondansetron hcl, Penicillins, Percocet [oxycodone-acetaminophen], Reclast [zoledronic acid], Vicodin [hydrocodone-acetaminophen], Codeine, Ibandronic acid, and Oxycodone  Family History  Problem Relation Age of Onset  . Heart disease Mother   . Hyperlipidemia Mother   . Tuberculosis Mother   . Colon cancer Father   . Colon cancer Other   . Lymphoma Other   . Heart disease Other   . Breast cancer Neg Hx     Social History Social History   Tobacco Use  . Smoking status: Former Smoker    Packs/day: 0.10    Years: 2.00    Pack years: 0.20    Types: Cigarettes    Quit date: 10/04/1991    Years since quitting: 27.9  . Smokeless tobacco: Never Used  . Tobacco comment: smoked on and off  Substance Use Topics  . Alcohol use: No  . Drug use: No    Review of Systems     ____________________________________________   PHYSICAL EXAM:  VITAL SIGNS: ED Triage Vitals  Enc Vitals Group     BP 09/22/19 1022 93/75     Pulse Rate 09/22/19 1022 97     Resp 09/22/19 1022 (!) 21     Temp 09/22/19 1022 98.3 F (36.8 C)     Temp Source 09/22/19 1022 Oral     SpO2 09/22/19 1020 99 %     Weight --      Height --      Head Circumference --      Peak Flow --      Pain Score --      Pain Loc --      Pain Edu? --      Excl. in GC? --      Constitutional: Alert but disoriented Eyes: Conjunctivae are normal.  Bruising around the right orbit Head: Stellate laceration to the right forehead Nose: No epistaxis, mild swelling and bruising to the nose Mouth/Throat: Mucous membranes are moist.    Cardiovascular: Normal rate, regular rhythm.   Good peripheral circulation.  No chest wall tenderness palpation Respiratory: Normal  respiratory effort.  No retractions.  Gastrointestinal: Soft and nontender. No distention.   Musculoskeletal: No vertebral tenderness palpation, no pain with axial load on both hips normal range of motion of the upper extremities and lower extremities.  No tenderness palpation to the pelvis Neurologic:  Normal speech and language. No gross focal neurologic deficits are appreciated.  Skin:  Skin is warm, dry and intact. No rash noted. Psychiatric: Mood and affect are normal. Speech and behavior are normal.  ____________________________________________   LABS (all labs ordered are listed, but only abnormal results are displayed)  Labs Reviewed - No data to display ____________________________________________  EKG  None ____________________________________________  RADIOLOGY CT demonstrates bilateral nasal fracture, no intracranial abnormality ____________________________________________   PROCEDURES  Procedure(s) performed: No  Procedures   Critical Care performed: No ____________________________________________   INITIAL IMPRESSION / ASSESSMENT AND PLAN / ED COURSE  Pertinent labs & imaging results that were available during my care of the patient were reviewed by me and considered in my medical decision making (see chart for details).  Patient presents after unwitnessed fall, will obtain imaging  Imaging demonstrates new fractures of both nasal bones, unchanged C1 anterior ring fracture.  No intracranial abnormality.  Steri-Strips used to close laceration on the forehead, tetanus  is up-to-date.  ENT follow-up    ____________________________________________   FINAL CLINICAL IMPRESSION(S) / ED DIAGNOSES  Final diagnoses:  Fall, initial encounter  Injury of head, initial encounter  Laceration of scalp, initial encounter  Closed fracture of nasal bone, initial encounter        Note:  This document was prepared using Dragon voice recognition software and may include unintentional dictation errors.   Lavonia Drafts, MD 09/22/19 [phone]

## 2019-09-22 NOTE — ED Notes (Signed)
Pt's wound and hair cleaned and temporary dressing placed.

## 2019-09-30 ENCOUNTER — Telehealth: Payer: Self-pay | Admitting: Pulmonary Disease

## 2019-09-30 NOTE — Telephone Encounter (Signed)
Called and spoke to pt's daughter, Jeannene Patella (DPR). [name] stated that pt has recently been transferred from memory care to skilled nursing. [name] stated that pt is supposed to be wearing cpap, however skilled nursing does not have record of pt being on cpap and they are unable to locate cpap machine. [name] requested that I pull a download to see if pt had been wearing cpap. Per our records, pt last wore cpap on 06/22/2019. [name] stated that skilled nursing would be ordering a new machine. [name] had questions regarding billing for machine. I have advised [name] to contact [employer] regarding billing questions.  Nothing further is needed.

## 2019-10-17 ENCOUNTER — Emergency Department: Payer: Medicare Other

## 2019-10-17 ENCOUNTER — Other Ambulatory Visit: Payer: Self-pay

## 2019-10-17 ENCOUNTER — Emergency Department
Admission: EM | Admit: 2019-10-17 | Discharge: 2019-10-17 | Disposition: A | Discharge status: Skilled Nursing Facility | Payer: Medicare Other | Attending: Emergency Medicine | Admitting: Emergency Medicine

## 2019-10-17 DIAGNOSIS — Z87891 Personal history of nicotine dependence: Secondary | ICD-10-CM | POA: Insufficient documentation

## 2019-10-17 DIAGNOSIS — Z8616 Personal history of COVID-19: Secondary | ICD-10-CM | POA: Insufficient documentation

## 2019-10-17 DIAGNOSIS — S0181XA Laceration without foreign body of other part of head, initial encounter: Secondary | ICD-10-CM | POA: Diagnosis not present

## 2019-10-17 DIAGNOSIS — Y999 Unspecified external cause status: Secondary | ICD-10-CM | POA: Diagnosis not present

## 2019-10-17 DIAGNOSIS — I10 Essential (primary) hypertension: Secondary | ICD-10-CM | POA: Diagnosis not present

## 2019-10-17 DIAGNOSIS — Y9389 Activity, other specified: Secondary | ICD-10-CM | POA: Insufficient documentation

## 2019-10-17 DIAGNOSIS — Y92128 Other place in nursing home as the place of occurrence of the external cause: Secondary | ICD-10-CM | POA: Insufficient documentation

## 2019-10-17 DIAGNOSIS — Z7982 Long term (current) use of aspirin: Secondary | ICD-10-CM | POA: Diagnosis not present

## 2019-10-17 DIAGNOSIS — E039 Hypothyroidism, unspecified: Secondary | ICD-10-CM | POA: Diagnosis not present

## 2019-10-17 DIAGNOSIS — W19XXXA Unspecified fall, initial encounter: Secondary | ICD-10-CM | POA: Diagnosis not present

## 2019-10-17 DIAGNOSIS — F0391 Unspecified dementia with behavioral disturbance: Secondary | ICD-10-CM | POA: Insufficient documentation

## 2019-10-17 DIAGNOSIS — S0990XA Unspecified injury of head, initial encounter: Secondary | ICD-10-CM

## 2019-10-17 DIAGNOSIS — Z79899 Other long term (current) drug therapy: Secondary | ICD-10-CM | POA: Diagnosis not present

## 2019-10-17 NOTE — Discharge Instructions (Addendum)
Ms. Bunting had a CT of her head and cervical spine.  The CT head was negative.  The cervical spine shows chronic, old fractures with no new findings.  Her left forehead wound was repaired with Dermabond.  She should return to the ER for altered mental status, severe headache, vomiting, or any other new or worsening symptoms.

## 2019-10-17 NOTE — ED Notes (Signed)
Ems here to take pt to [facility].  Pt alert.  No acute distress

## 2019-10-17 NOTE — ED Provider Notes (Signed)
[facility] Emergency Department Provider Note ____________________________________________   First MD Initiated Contact with Patient 10/17/19 1247     (approximate)  I have reviewed the triage vital signs and the nursing notes.   HISTORY  Chief Complaint Fall  Level 5 caveat: History of present illness limited due to dementia  HPI Monica Downs is a 79 y.o. female with PMH as noted below who presents after an unwitnessed fall.  The patient was found on the floor and noted to have a laceration on her left forehead.  She does not remember what happened.  She reports some pain to her head and neck.  She denies other injuries.  Per EMS, facility staff noted that the patient was at her baseline mental status.  Past Medical History:  Diagnosis Date  . Allergic rhinitis   . Anxiety   . CAD (coronary artery disease)    a. 07/2007 Cath/PCI: LM nl, LAD 99p (3.0 x 18 Xience EES), D1 90ost, LCX nl, RCA nl;  b. 09/2014 MV: EF 72%, attenuation artifact, no ischemia.  . Clotting disorder (HCC)    LEGS;throat  . Falls   . History of subarachnoid hemorrhage    a. 04/2016 In setting of fall-->traumatic brain injury.  Marland Kitchen HLD (hyperlipidemia)   . Hypertension   . Hypothyroidism   . Incontinence   . Left Forearm AV Fistula    a. Asymptomatic.  No blood draws/IV's;  b. 06/2013 confirmed by L FA duplex.  . Mitral regurgitation    a. 02/2011 Echo: EF 72%, trace TR, mild MR/AI; b. 01/2018 Echo: EF 60-65%, no rwma, Gr2 DD, mild AI/MR. Nl RV fxn. Mildly dil RV/RA. Mod TR. Nl PASP.  Marland Kitchen Obstructive sleep apnea   . Panic attacks   . Traumatic brain injury (HCC)    a. 04/2016 in setting of fall/SAH.    Patient Active Problem List   Diagnosis Date Noted  . Altered mental status 09/04/2019  . Acute cystitis with hematuria   . Nondisplaced posterior arch fracture of first cervical vertebra with routine healing   . COVID-19   . Fall   . Hypokalemia 09/03/2019  . Acute lower UTI  09/03/2019  . AMS (altered mental status) 09/03/2019  . Elevated troponin 09/03/2019  . Unspecified dementia with behavioral disturbance (HCC) 01/02/2019  . Traumatic subarachnoid hemorrhage (HCC) 04/11/2016  . 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 CAD (coronary artery disease)   . HLD (hyperlipidemia)     Past Surgical History:  Procedure Laterality Date  . BUNIONETTE EXCISION    . CARDIAC CATHETERIZATION  07/2007   99% mid LAD stenosis  . CORONARY STENT PLACEMENT  07/2007   Mid LAD: 3.0 X 18 mm Xience stent  . MULTIPLE TOOTH EXTRACTIONS    . THYROIDECTOMY    . TOTAL KNEE ARTHROPLASTY     left    Prior to Admission medications   Medication Sig Start Date End Date Taking? Authorizing Provider  aspirin 81 MG tablet Take 81 mg by mouth daily.    Yes [provider]  atorvastatin (LIPITOR) 40 MG tablet TAKE ONE TABLET BY MOUTH EVERY DAY Patient taking differently: Take 40 mg by mouth at bedtime.  12/14/18  Yes Iran Ouch, MD  Azelastine HCl 137 MCG/SPRAY SOLN Place 1 spray into both nostrils 2 (two) times daily.   Yes [provider]  Cholecalciferol (VITAMIN D3) 1000 units CAPS Take 1,000 Units by mouth daily.  Yes [provider]  denosumab (PROLIA) 60 MG/ML SOLN injection Inject 60 mg into the skin every 6 (six) months. Administer in upper arm, thigh, or abdomen   Yes [provider]  donepezil (ARICEPT) 10 MG tablet Take 10 mg by mouth daily.    Yes [provider]  ENSURE (ENSURE) Take 237 mLs by mouth 2 (two) times daily between meals.   Yes [provider]  ferrous sulfate 325 (65 FE) MG tablet Take 325 mg by mouth daily with breakfast.   Yes [provider]  fluticasone (FLONASE) 50 MCG/ACT nasal spray Place 1 spray into both nostrils daily as needed for allergies or rhinitis (congestion).   Yes [provider]  levothyroxine (SYNTHROID, LEVOTHROID) 75 MCG  tablet Take 75 mcg by mouth daily before breakfast.  01/08/16  Yes [provider]  Multiple Vitamin (MULTIVITAMIN WITH MINERALS) TABS tablet Take 1 tablet by mouth daily. 09/06/19  Yes Enedina Finner, MD  mupirocin ointment (BACTROBAN) 2 % Apply 1 application topically 2 (two) times daily. (apply to skin tears)    Yes [provider]  OLANZapine (ZYPREXA) 5 MG tablet Take 5 mg by mouth 2 (two) times daily.   Yes [provider]  Pollen Extracts (PROSTAT PO) Take 30 mLs by mouth 2 (two) times daily.   Yes [provider]  traZODone (DESYREL) 50 MG tablet Take 50 mg by mouth at bedtime.   Yes [provider]    Allergies Azithromycin, Hydrocodone-acetaminophen, Mobic [meloxicam], Ondansetron hcl, Penicillins, Percocet [oxycodone-acetaminophen], Reclast [zoledronic acid], Vicodin [hydrocodone-acetaminophen], Codeine, Ibandronic acid, and Oxycodone  Family History  Problem Relation Age of Onset  . Heart disease Mother   . Hyperlipidemia Mother   . Tuberculosis Mother   . Colon cancer Father   . Colon cancer Other   . Lymphoma Other   . Heart disease Other   . Breast cancer Neg Hx     Social History Social History   Tobacco Use  . Smoking status: Former Smoker    Packs/day: 0.10    Years: 2.00    Pack years: 0.20    Types: Cigarettes    Quit date: 10/04/1991    Years since quitting: 28.0  . Smokeless tobacco: Never Used  . Tobacco comment: smoked on and off  Substance Use Topics  . Alcohol use: No  . Drug use: No    Review of Systems Level 5 caveat: Unable to obtain review of systems due to dementia    ____________________________________________   PHYSICAL EXAM:  VITAL SIGNS: ED Triage Vitals [10/17/19 1241]  Enc Vitals Group     BP 113/70     Pulse Rate 72     Resp 16     Temp 97.9 F (36.6 C)     Temp Source Oral     SpO2 95 %     Weight 110 lb 10.7 oz (50.2 kg)     Height 5' (1.524 m)     Head Circumference      Peak  Flow      Pain Score 0     Pain Loc      Pain Edu?      Excl. in GC?     Constitutional: Alert and oriented. Well appearing and in no acute distress. Eyes: Conjunctivae are normal.  Head: Left forehead with approximately 3 cm vertical superficial laceration. Nose: No congestion/rhinnorhea. Mouth/Throat: Mucous membranes are moist.   Neck: Normal range of motion.  No midline cervical spinal tenderness.  Cardiovascular: Normal rate, regular rhythm. Good peripheral circulation. Respiratory: Normal respiratory effort.  No retractions.  Gastrointestinal: Soft and nontender. No distention.  Genitourinary: No CVA tenderness. Musculoskeletal: Full range of motion all extremities.  No midline spinal tenderness.  No step-off or crepitus. Neurologic: Motor intact in all extremities. Skin:  Skin is warm and dry. No rash noted. Psychiatric: Calm and cooperative.  ____________________________________________   LABS (all labs ordered are listed, but only abnormal results are displayed)  Labs Reviewed - No data to display ____________________________________________  EKG   ____________________________________________  RADIOLOGY  CT head: No ICH or other acute findings CT cervical spine: Several redemonstrated chronic fractures with no acute traumatic findings  ____________________________________________   PROCEDURES  Procedure(s) performed: Yes  .Marland KitchenLaceration Repair  Date/Time: 10/17/2019 3:23 PM Performed by: Dionne Bucy, MD Authorized by: Dionne Bucy, MD   Consent:    Consent obtained:  Verbal   Consent given by:  Patient   Risks discussed:  Infection, pain, retained foreign body, poor cosmetic result and poor wound healing Anesthesia (see MAR for exact dosages):    Anesthesia method:  None Laceration details:    Length (cm):  3   Depth (mm):  2 Repair type:    Repair type:  Simple Exploration:    Hemostasis achieved with:  Direct pressure   Wound  exploration: entire depth of wound probed and visualized     Contaminated: no   Treatment:    Area cleansed with:  Saline   Amount of cleaning:  Standard   Irrigation solution:  Sterile saline   Visualized foreign bodies/material removed: no   Skin repair:    Repair method:  Tissue adhesive Approximation:    Approximation:  Close Post-procedure details:    Dressing:  Open (no dressing)   Patient tolerance of procedure:  Tolerated well, no immediate complications    Critical Care performed: No ____________________________________________   INITIAL IMPRESSION / ASSESSMENT AND PLAN / ED COURSE  Pertinent labs & imaging results that were available during my care of the patient were reviewed by me and considered in my medical decision making (see chart for details).  79 year old female with PMH as noted above presents after an unwitnessed fall at her facility.  She has a laceration to the left forehead but no other apparent injuries.  The patient does not clearly remember what happened.  She reports some pain to her neck but denies pain in the rest of her back, her extremities, or other areas.  On exam, the patient is frail but relatively comfortable appearing.  She has a superficial laceration to the left forehead.  Neurologic exam is nonfocal.  There is no other evidence of trauma.  Overall presentation is most consistent with mechanical fall.  Given that the patient has normal vital signs and is alert and at her baseline mental status, there is no indication for further medical work-up at this time.  We will obtain a CT head and cervical spine and reassess.  ----------------------------------------- 3:23 PM on 10/17/2019 -----------------------------------------  CT head is negative for acute findings.  CT cervical spine also shows no acute findings although there are several old fractures that are redemonstrated.  The patient has no significant acute cervical spinal pain or  tenderness.  I repaired the left forehead laceration with tissue glue.  The patient is stable for discharge back to her facility.  Discharge instructions and return precautions have been provided.  ____________________________________________   FINAL CLINICAL IMPRESSION(S) / ED DIAGNOSES  Final diagnoses:  Laceration of forehead, initial encounter  Injury of head, initial encounter      NEW MEDICATIONS STARTED DURING THIS VISIT:  New Prescriptions   No medications on file     Note:  This document was prepared using Dragon voice recognition software and may include unintentional dictation errors.    Arta Silence, MD 10/17/19 1524

## 2019-10-17 NOTE — ED Notes (Signed)
Patient transported to CT 

## 2019-10-17 NOTE — ED Notes (Signed)
[facility]  CALLED  FOR  TRANSPORT 

## 2019-10-17 NOTE — ED Triage Notes (Addendum)
Pt arrived via [facility] from [facility] for a fall. Pt has lac to left side of head, gauze applied and wrapped by ems. Pt denies pain. Pt takes ASA, no blood thinners.

## 2019-10-17 NOTE — ED Notes (Signed)
Pt waiting on ems.  Pt in hallway bed.  Pt awake.  siderails up x 2

## 2019-10-17 NOTE — ED Notes (Signed)
Resumed care from dee rn.  Pt alert.  Pt waiting on transport to [facility].  Pt in hallway bed,

## 2019-12-12 ENCOUNTER — Telehealth: Payer: Self-pay

## 2019-12-12 NOTE — Telephone Encounter (Signed)
Lm for pt's daughter, [name] (DPR).

## 2019-12-12 NOTE — Telephone Encounter (Signed)
Received Rx request from [employer] for cpap supplies.  Former [facility] pt due for [employer].  Lm to schedule OV.

## 2019-12-13 NOTE — Telephone Encounter (Signed)
Spoke to pt's daughter, [name](DPR) and relayed below message.  Pt has been scheduled for 01/23/2020 at 4:00 with Dr. Belia Heman. Pt is a former [facility] pt, however this was first available and is only a slot.  Dr. Belia Heman please advise if slot is okay. Thanks.

## 2019-12-17 NOTE — Telephone Encounter (Signed)
Spoke with Dr. Belia Heman this is fine to keep patient in the 15 minute slot.   Nothing further needed at this time.

## 2019-12-24 ENCOUNTER — Telehealth: Payer: Self-pay | Admitting: Internal Medicine

## 2019-12-24 NOTE — Telephone Encounter (Signed)
ATC and went straight to VM. I will call back

## 2019-12-24 NOTE — Telephone Encounter (Signed)
LMTCB again before the end of the day so that I can access patient's symptoms.

## 2019-12-24 NOTE — Telephone Encounter (Signed)
LM for [name] to call back

## 2019-12-24 NOTE — Telephone Encounter (Signed)
Pt called back. Please return call.  

## 2019-12-25 NOTE — Telephone Encounter (Signed)
I called and spoke with patient's daughter [name] and she states that she is a former patient of Dr. Bard Herbert and she has an appt. To re-establish on 01/23/20 for her OSA. She wanted to see if it was a sooner appt. Due to her having a cough and congestion. I looked through the schedule and there was nothing any sooner. She states that her mother has dementia and the doctors at her facility have been treating her for this but she wanted the Pulmonary doctor to take a look. I confirmed that she was not in any distress. She states that she does not care for the facility doctors is all. I advised her that we will call if anything sooner becomes available. She verbalized understanding.   I will forward this to Dr. Belia Heman as an Lorain Childes.

## 2019-12-25 NOTE — Telephone Encounter (Signed)
Thank you. I recommend OV with APP if available

## 2019-12-25 NOTE — Telephone Encounter (Signed)
LMTCB for Monica Downs 

## 2020-01-23 ENCOUNTER — Ambulatory Visit (INDEPENDENT_AMBULATORY_CARE_PROVIDER_SITE_OTHER): Payer: Medicare Other | Admitting: Internal Medicine

## 2020-01-23 ENCOUNTER — Other Ambulatory Visit: Payer: Self-pay

## 2020-01-23 ENCOUNTER — Telehealth: Payer: Self-pay | Admitting: Internal Medicine

## 2020-01-23 ENCOUNTER — Encounter: Payer: Self-pay | Admitting: Internal Medicine

## 2020-01-23 DIAGNOSIS — F0390 Unspecified dementia without behavioral disturbance: Secondary | ICD-10-CM | POA: Diagnosis not present

## 2020-01-23 DIAGNOSIS — G4733 Obstructive sleep apnea (adult) (pediatric): Secondary | ICD-10-CM

## 2020-01-23 DIAGNOSIS — F03C0 Unspecified dementia, severe, without behavioral disturbance, psychotic disturbance, mood disturbance, and anxiety: Secondary | ICD-10-CM

## 2020-01-23 NOTE — Patient Instructions (Signed)
Continue CPAP as prescribed Obtain download for CPAP compliance Contact DME company to obtain supplies Recommend hospice and palliative care referral

## 2020-01-23 NOTE — Telephone Encounter (Signed)
Patient was seen in office today and daughter [name] was called at [phone] and spoke with Dr. Belia Heman. Patient was dropped off by transport and was not accompanied by anyone at visit. Expressed to daughter that she needs to come to appts with her. She expressed understanding. Nothing further needed at this time.

## 2020-01-23 NOTE — Progress Notes (Signed)
PULMONARY/SLEEP OFFICE FOLLOW UP NOTE  Requesting MD/Service: Arida. Primary MD - Arlana Pouch Date of initial consultation: 06/17/16 Reason for consultation: Sleep apnea  PT PROFILE: 52 F remote smoker (minimal) with h/o fall and traumatic SAH, history of CAD, prior diagnosis of OSA, previously intolerant to CPAP referred for further eval/mgmt of OSA  PROBLEMS Fall [W19.XXXA] Acute cystitis with hematuria [N30.01] Fall, initial encounter [W19.XXXA] Subluxation of right shoulder joint, initial encounter [S43.001A] Laceration of scalp without foreign body, initial encounter [S01.01XA] Closed nondisplaced fracture of posterior arch of first cervical vertebra with routine healing, subsequent encounter [S12.031D] Altered mental status [R41.82] SEVERE DEMENTIA COVID 19 infection 08/2019  DATA: PSG 07/29/16: Severe OSA with AHI 61/hr. CPAP recommendation of 11 cm H2O Compliance 01/21-02/19/18: Used 28/30 nights, avg 7 hr 16 mins per night. Significant leak noted. Still with AI 10.7/hr Compliance 08/05-09/03/18: Use 29/30 nights, > 4 hrs: 26/29 nights, avg duration 7h , avg AHI 12.5/hr Compliance 03/20-0418/19: Use 28/30 nights, > 4 hrs: 25/30 nights, avg duration 5h , avg AHI 5.9/hr   CC Follow up OSA  HPI Severe dementia Apparently uses CPAP for severe OSA history obtained from Daughter by phone  UNABLE TO PROVIDE ROS SEVERE DEMENTIA  VS BP  104/58  HR 92  Physical Examination:   General Appearance: No distress , severe dementia, thin and frail Neuro:without focal findings,  speech garbled HEENT: PERRLA, EOM intact.   Pulmonary: normal breath sounds, No wheezing.  CardiovascularNormal S1,S2.  No m/r/g.   Abdomen: Benign, Soft, non-tender. Renal:  No costovertebral tenderness  GU:  Not performed at this time. Endoc: No evident thyromegaly Skin:   warm, no rashes, no ecchymosis  Extremities: normal, no cyanosis, clubbing. PSYCHIATRIC: Severe dementia   DATA:   BMP  Latest Ref Rng & Units 09/22/2019 09/04/2019 09/03/2019  Glucose 70 - 99 mg/dL 426(S) 86 341(D)  BUN 8 - 23 mg/dL 15 14 17   Creatinine 0.44 - 1.00 mg/dL 6.22 2.97  Sodium 135 - 145 mmol/L 138 143 144  Potassium 3.5 - 5.1 mmol/L 3.6 3.2(L) 3.3(L)  Chloride 98 - 111 mmol/L 95(L) 105 101  CO2 22 - 32 mmol/L 32 28 33(H)  Calcium 8.9 - 10.3 mg/dL 8.2(L) 7.7(L) 8.6(L)    CBC Latest Ref Rng & Units 09/22/2019 09/04/2019 09/03/2019  WBC 4.0 - 10.5 K/uL 17.8(H) 8.6 8.4  Hemoglobin 12.0 - 15.0 g/dL 10.3(L) 11.1(L) 11.9(L)  Hematocrit 36.0 - 46.0 % 30.6(L) 34.4(L) 36.4  Platelets 150 - 400 K/uL 251 258 279    CXR:  NNF  ASSESSMENT/PLAN Severe OSA Continue CPAP as prescribed Will need to contact DME company to get supplies Need compliance report to assess AHI  Patient with multiple medical issues in the setting of severe dementia Patient has very poor prognosis I recommend hospice and palliative care referral Recommend DNR/DNI status    COVID-19 EDUCATION: The signs and symptoms of COVID-19 were discussed with the patient and how to seek care for testing.  The importance of social distancing was discussed today. Hand Washing Techniques and avoid touching face was advised.     MEDICATION ADJUSTMENTS/LABS AND TESTS ORDERED:  Continue CPAP as tolerated  CURRENT MEDICATIONS REVIEWED AT LENGTH WITH PATIENT TODAY   Patient satisfied with Plan of action and management. All questions answered  Follow up in 1 year   Hortencia Martire 06-24-1995, M.D.  [facility]  Medical Director [facility] Medical Director [facility] Cardio-Pulmonary Department

## 2020-06-30 IMAGING — CT CT CERVICAL SPINE W/O CM
3 of 4 series · 10 of 33 positions shown, 11 images · non-contrast
Comparison: CT head/cervical spine 09/22/2019

CLINICAL DATA: Head trauma, minor.  Neck trauma.

EXAM:
CT HEAD WITHOUT CONTRAST
CT CERVICAL SPINE WITHOUT CONTRAST
TECHNIQUE: Multidetector CT imaging of the head and cervical spine was
performed following the standard protocol without intravenous
contrast. Multiplanar CT image reconstructions of the cervical spine
were also generated.

[Series 3: c spine soft · axial · 0.48mm/px · z∈[-271,-235]mm · 3 of 63 slices shown]
[im 9/63  soft-tissue]
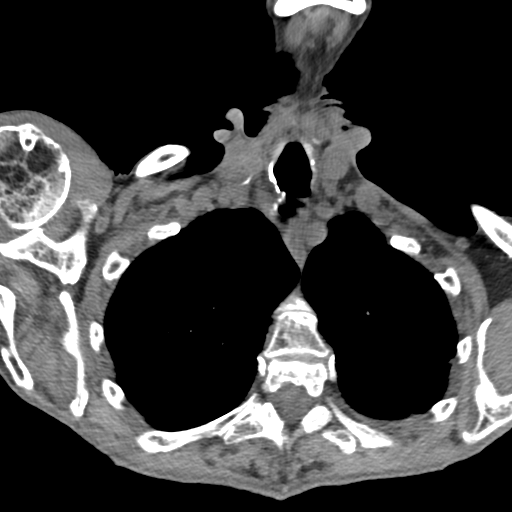
[im 18/63  soft-tissue]
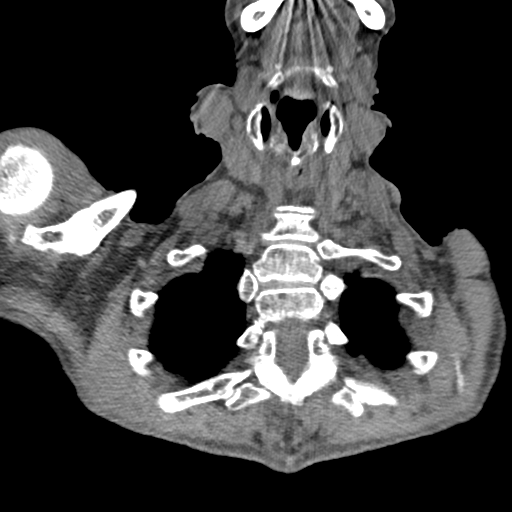
[im 27/63  soft-tissue]
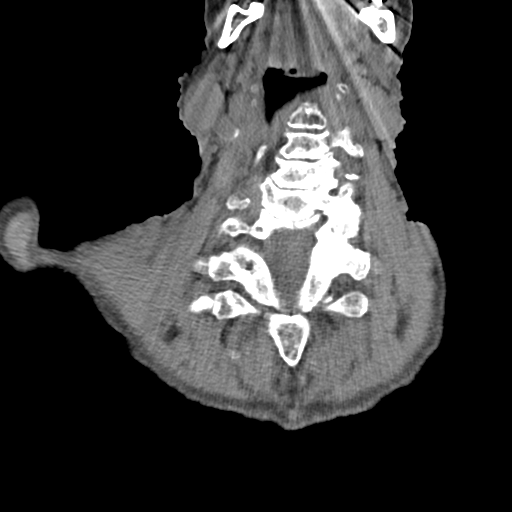

[Series 7: coronal bone · axial · 0.22mm/px · z∈[-238,-181]mm · 4 of 53 slices shown, 5 images]
[im 11/53  soft-tissue]
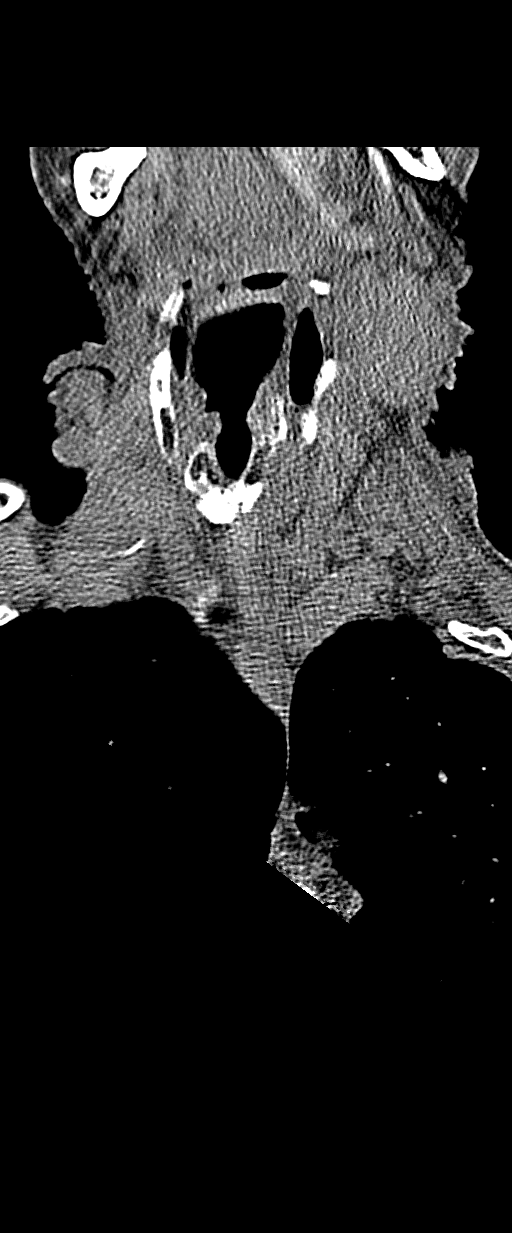
[im 11/53  bone]
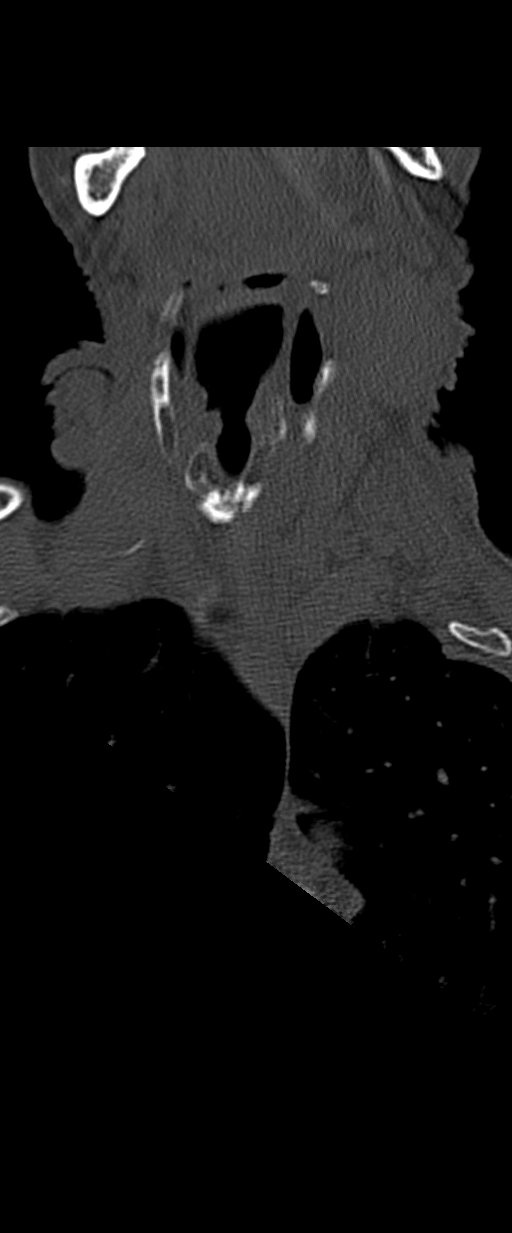
[im 21/53  bone]
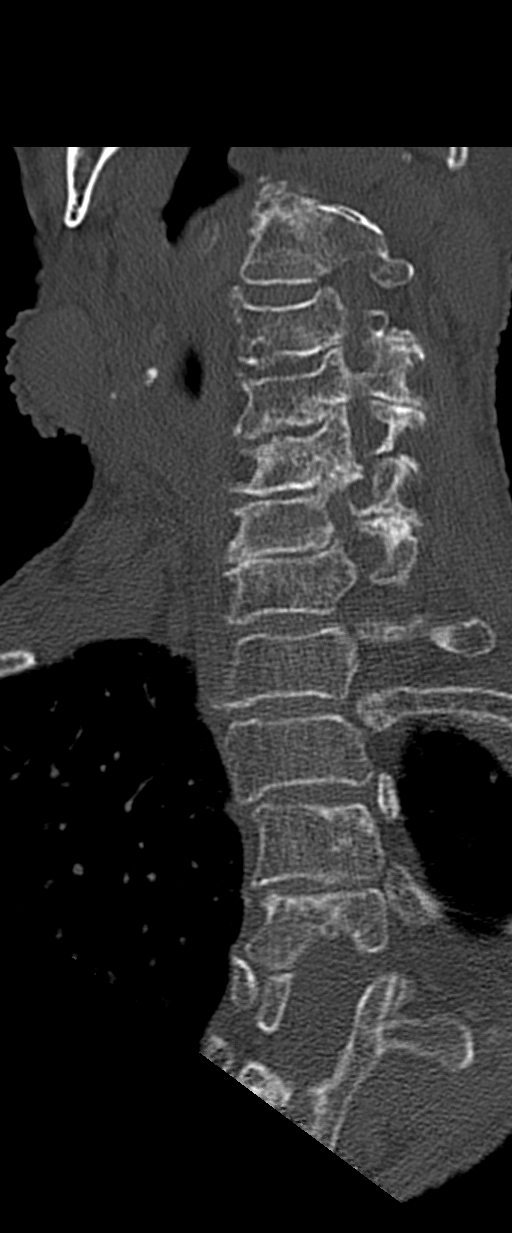
[im 32/53  bone]
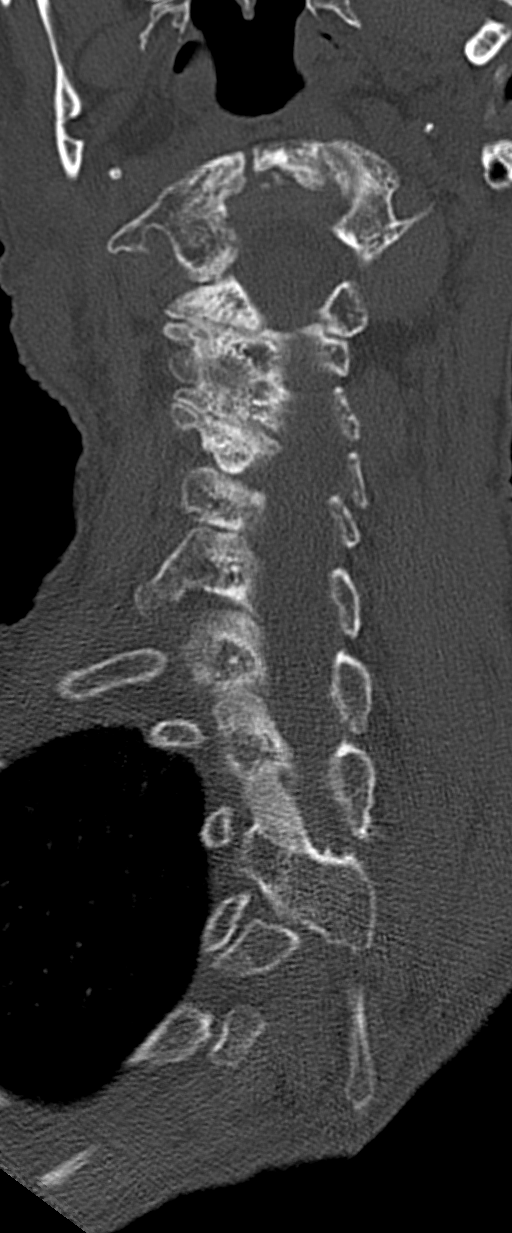
[im 42/53  bone]
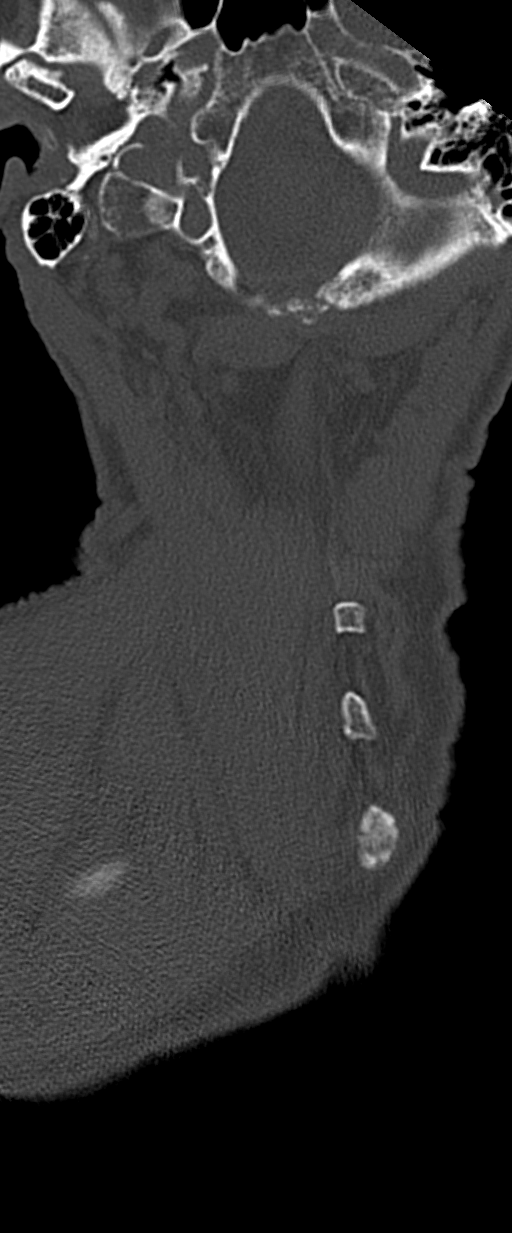

[Series 8: orthogonal bone · coronal · 0.20mm/px · 3 of 139 slices shown]
[im 47/139  bone]
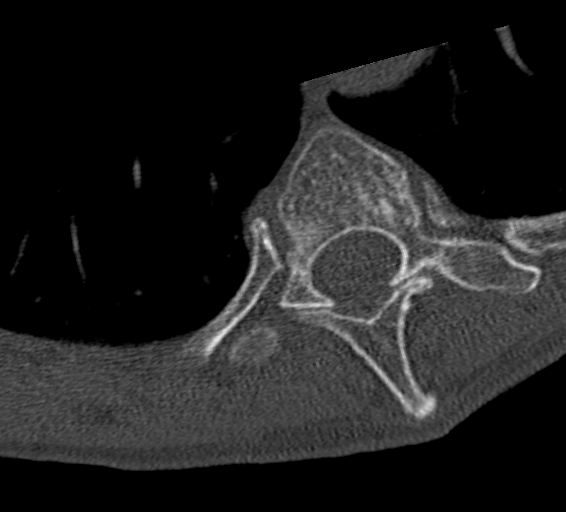
[im 62/139  bone]
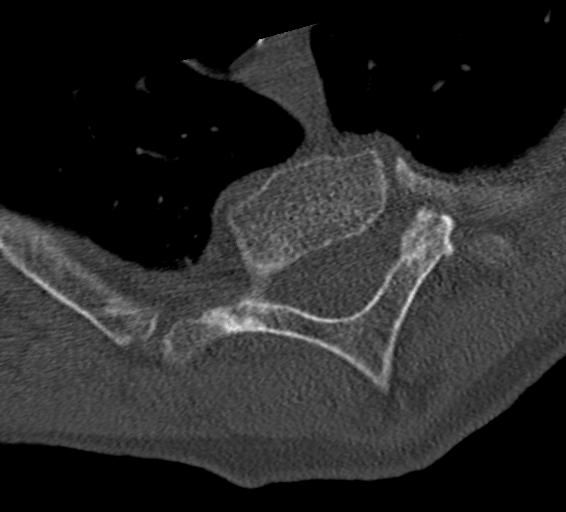
[im 77/139  bone]
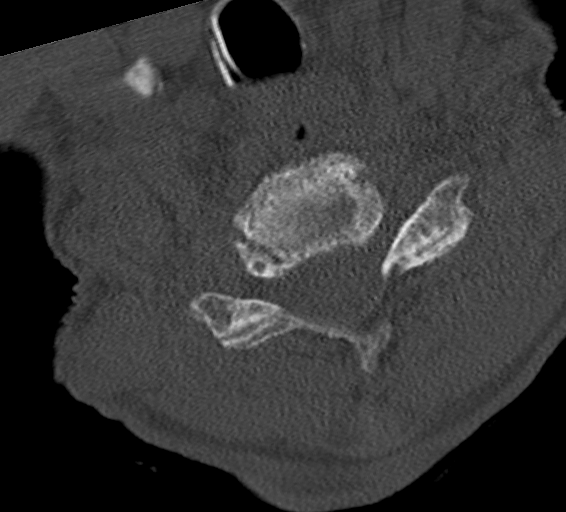

[10 of 33 positions shown; findings below may reference images not displayed]

FINDINGS: CT HEAD FINDINGS

Brain:

No evidence of acute intracranial hemorrhage.

No demarcated cortical infarction.

No evidence of intracranial mass.

No midline shift or extra-axial fluid collection.

Stable generalized parenchymal atrophy and chronic small vessel
ischemic disease.

Vascular: No hyperdense vessel. Atherosclerotic calcifications.

Skull: Normal. Negative for fracture or focal lesion.

Sinuses/Orbits: Visualized orbits demonstrate no acute abnormality.
Redemonstrated known comminuted bilateral nasal bone fractures. No
significant paranasal sinus disease at the imaged levels. Left
mastoid effusion.

Other: Small residual right anterior scalp hematoma (series 2, image
14).

CT CERVICAL SPINE FINDINGS

Alignment: Reversal of the expected cervical lordosis. Unchanged
mild grade 1 anterolisthesis at C3-C4 and C4-C5.

Skull base and vertebrae: Unchanged mildly displaced fracture of the
anterior arch of C1. No new cervical spine fracture is identified.
Redemonstrated small minimally displaced chronic fracture of the T2
spinous process. Unchanged T3 and T4 anterior wedge compression
deformities with the greatest degree of height loss present at the
T4 level, approximately 50%.

Soft tissues and spinal canal: No prevertebral fluid or swelling. No
visible canal hematoma.

Disc levels: Cervical spondylosis with multilevel disc height loss,
posterior disc osteophytes, uncovertebral and facet hypertrophy. No
high-grade bony spinal canal narrowing. Degenerative fusion of the
right C3-C4 articular pillars.

Upper chest: No consolidation within the imaged lung apices. No
visible pneumothorax.
IMPRESSION: CT head:

1. No evidence of acute intracranial abnormality.
2. Stable generalized parenchymal atrophy and chronic small vessel
ischemic disease.
3. Redemonstrated no comminuted bilateral nasal bone fractures.
4. Left mastoid effusion.

CT cervical spine:

1. Redemonstrated known mildly displaced fracture of the anterior
arch of C1. No new cervical spine fracture is identified.
2. Unchanged T3 and T4 anterior wedge compression deformities.
3. Redemonstrated chronic T2 spinous process fracture.

## 2020-06-30 IMAGING — CT CT HEAD W/O CM
3 series · 15 of 47 positions shown, 18 images · non-contrast
Comparison: CT head/cervical spine 09/22/2019

CLINICAL DATA: Head trauma, minor.  Neck trauma.

EXAM:
CT HEAD WITHOUT CONTRAST
CT CERVICAL SPINE WITHOUT CONTRAST
TECHNIQUE: Multidetector CT imaging of the head and cervical spine was
performed following the standard protocol without intravenous
contrast. Multiplanar CT image reconstructions of the cervical spine
were also generated.

[Series 2: head wo · axial · 0.47mm/px · z∈[-185,-60]mm · 9 of 30 slices shown, 12 images]
[im 3/30  brain]
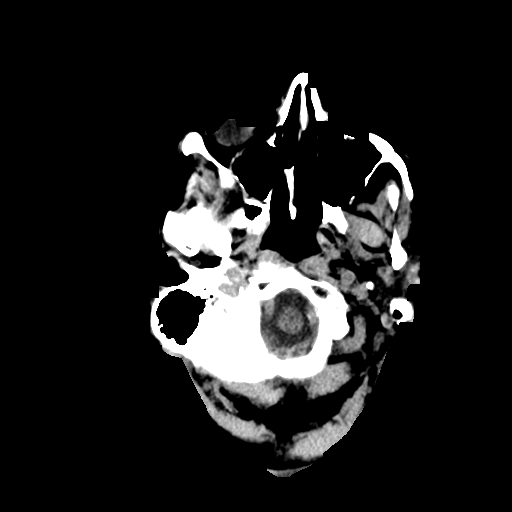
[im 3/30  bone]
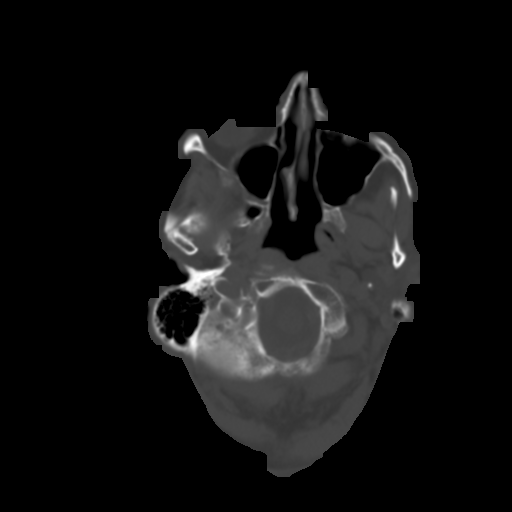
[im 6/30  brain]
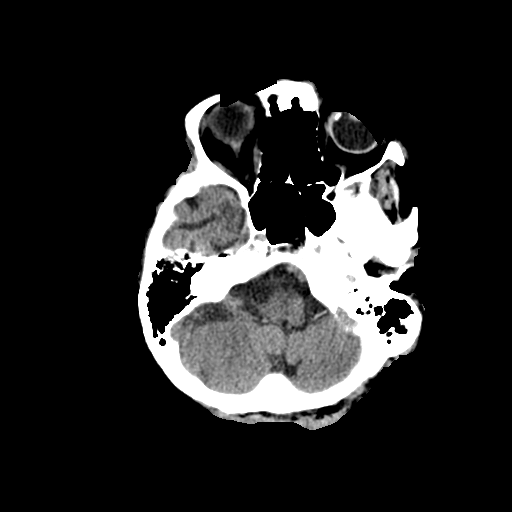
[im 9/30  brain]
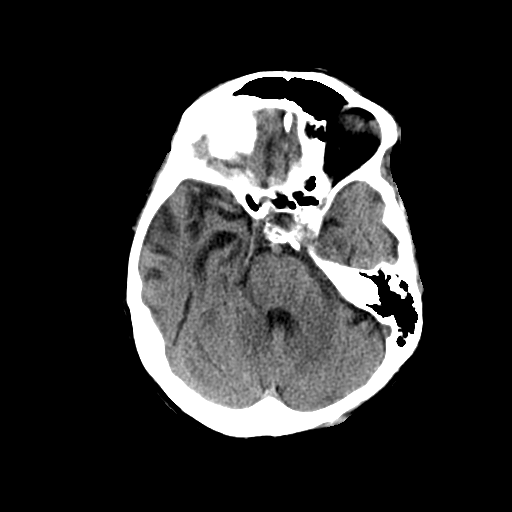
[im 12/30  brain]
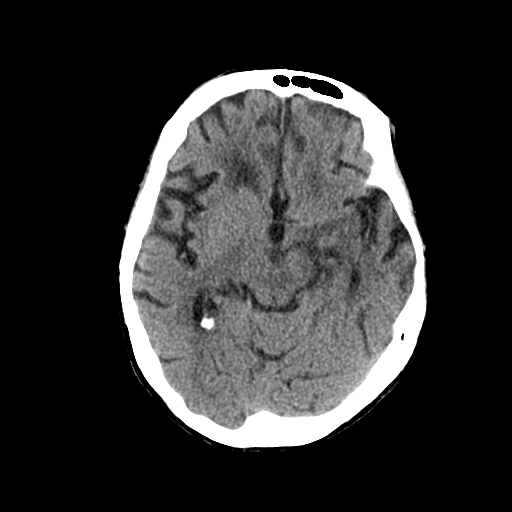
[im 16/30  brain]
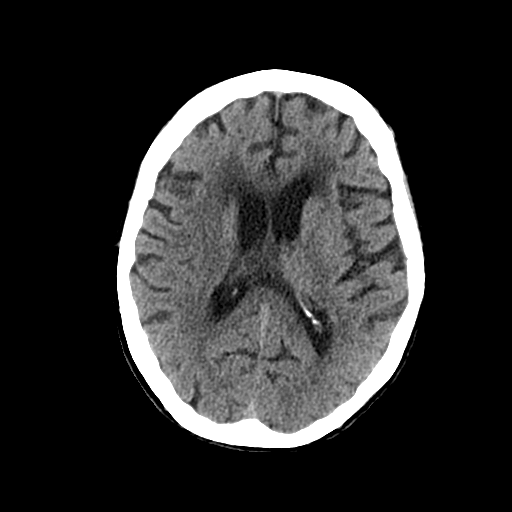
[im 16/30  bone]
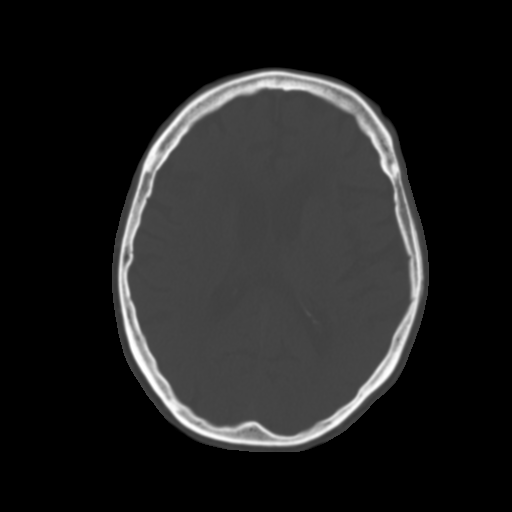
[im 19/30  brain]
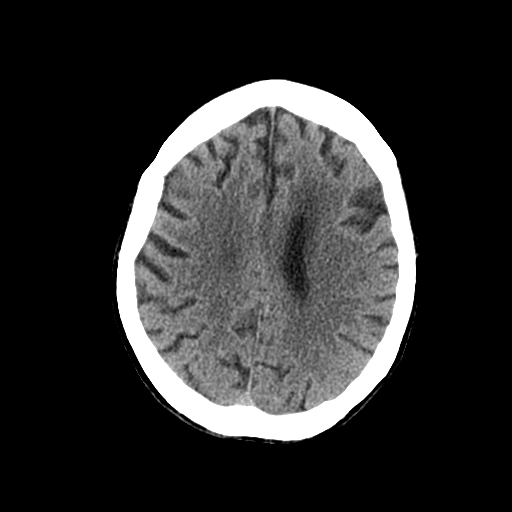
[im 22/30  brain]
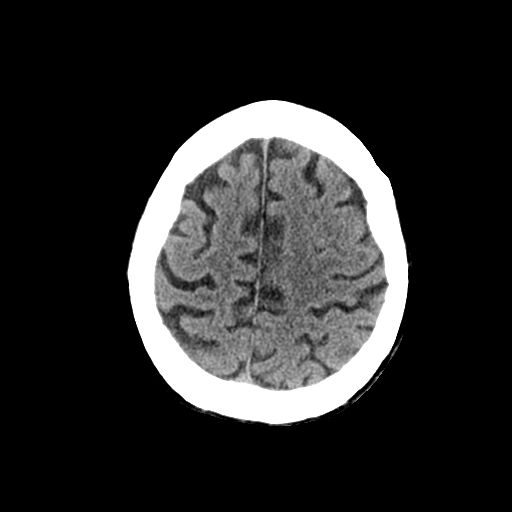
[im 25/30  brain]
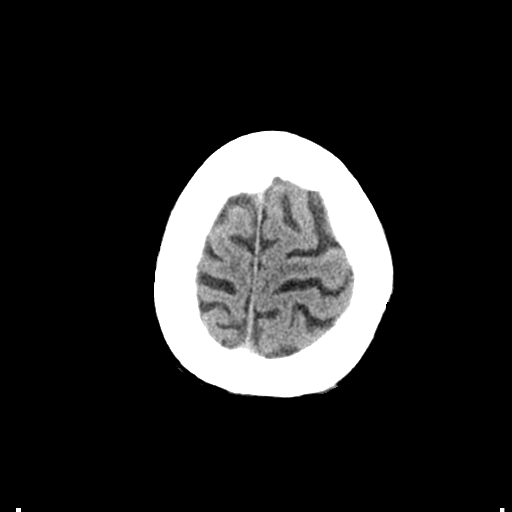
[im 28/30  brain]
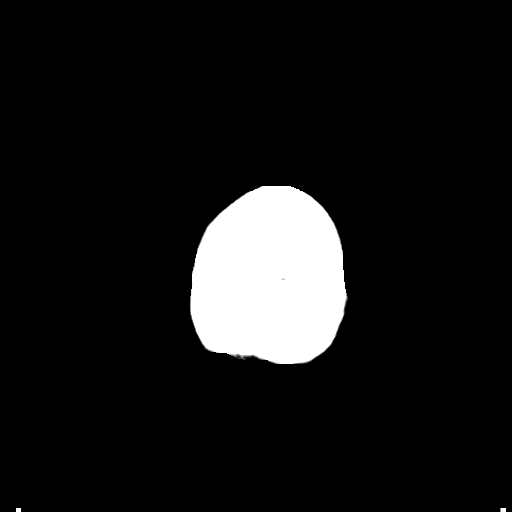
[im 28/30  bone]
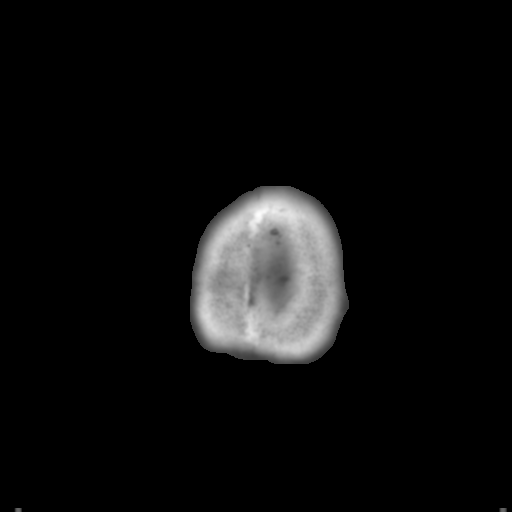

[Series 4: coronal soft tissue · coronal · 0.31mm/px · 3 of 65 slices shown]
[im 22/65  brain]
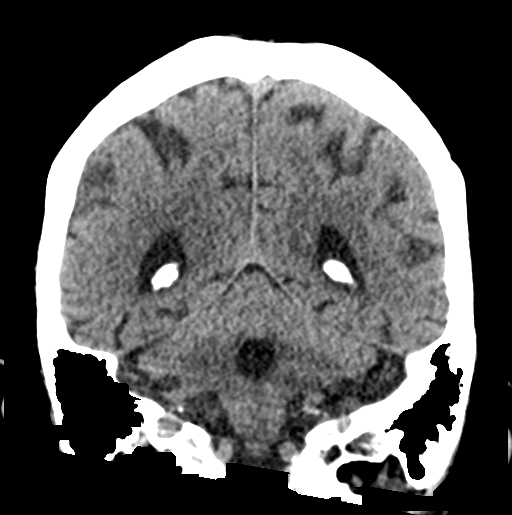
[im 29/65  brain]
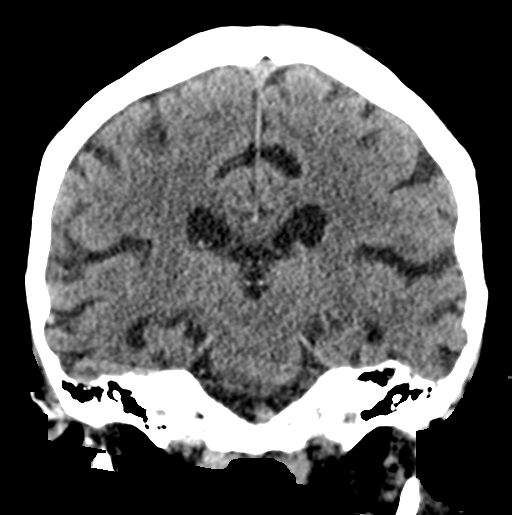
[im 36/65  brain]
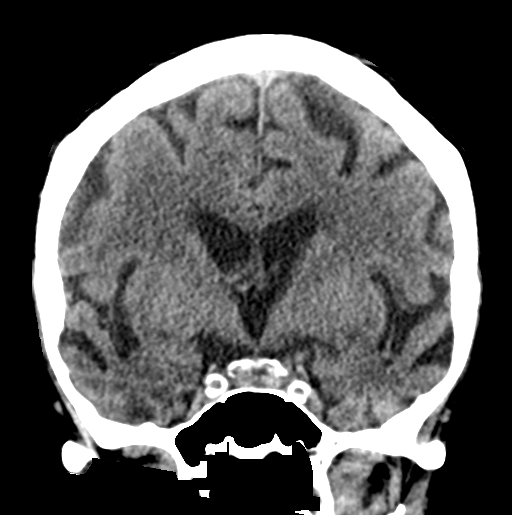

[Series 5: sagittal soft tissue · sagittal · 0.31mm/px · 3 of 53 slices shown]
[im 20/53  brain]
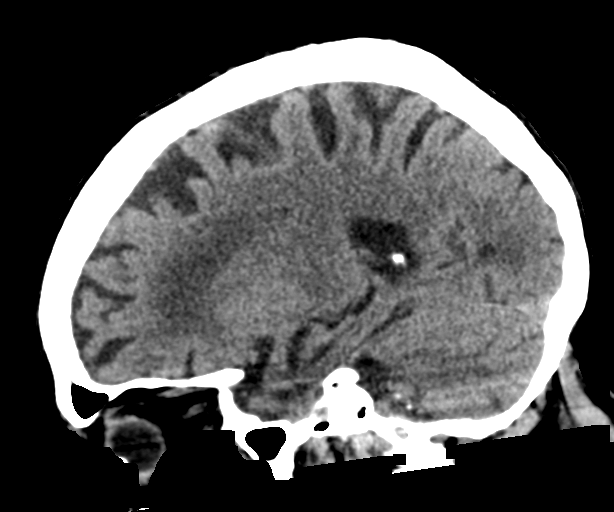
[im 27/53  brain]
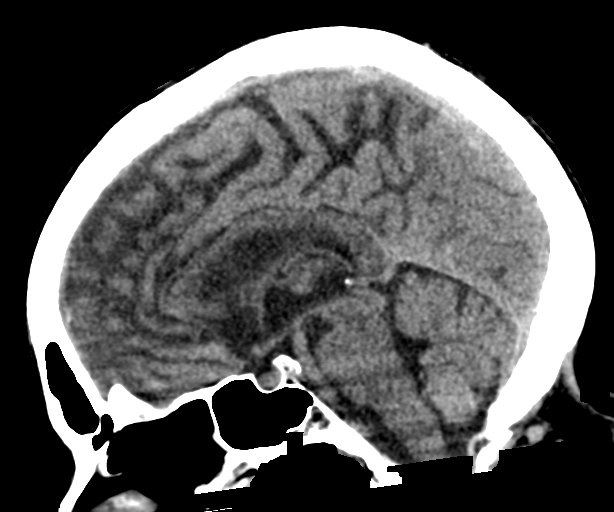
[im 33/53  brain]
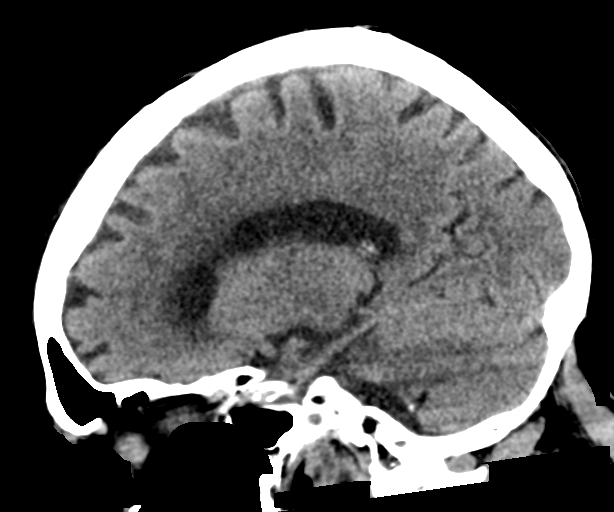

[15 of 47 positions shown; findings below may reference images not displayed]

FINDINGS: CT HEAD FINDINGS

Brain:

No evidence of acute intracranial hemorrhage.

No demarcated cortical infarction.

No evidence of intracranial mass.

No midline shift or extra-axial fluid collection.

Stable generalized parenchymal atrophy and chronic small vessel
ischemic disease.

Vascular: No hyperdense vessel. Atherosclerotic calcifications.

Skull: Normal. Negative for fracture or focal lesion.

Sinuses/Orbits: Visualized orbits demonstrate no acute abnormality.
Redemonstrated known comminuted bilateral nasal bone fractures. No
significant paranasal sinus disease at the imaged levels. Left
mastoid effusion.

Other: Small residual right anterior scalp hematoma (series 2, image
14).

CT CERVICAL SPINE FINDINGS

Alignment: Reversal of the expected cervical lordosis. Unchanged
mild grade 1 anterolisthesis at C3-C4 and C4-C5.

Skull base and vertebrae: Unchanged mildly displaced fracture of the
anterior arch of C1. No new cervical spine fracture is identified.
Redemonstrated small minimally displaced chronic fracture of the T2
spinous process. Unchanged T3 and T4 anterior wedge compression
deformities with the greatest degree of height loss present at the
T4 level, approximately 50%.

Soft tissues and spinal canal: No prevertebral fluid or swelling. No
visible canal hematoma.

Disc levels: Cervical spondylosis with multilevel disc height loss,
posterior disc osteophytes, uncovertebral and facet hypertrophy. No
high-grade bony spinal canal narrowing. Degenerative fusion of the
right C3-C4 articular pillars.

Upper chest: No consolidation within the imaged lung apices. No
visible pneumothorax.
IMPRESSION: CT head:

1. No evidence of acute intracranial abnormality.
2. Stable generalized parenchymal atrophy and chronic small vessel
ischemic disease.
3. Redemonstrated no comminuted bilateral nasal bone fractures.
4. Left mastoid effusion.

CT cervical spine:

1. Redemonstrated known mildly displaced fracture of the anterior
arch of C1. No new cervical spine fracture is identified.
2. Unchanged T3 and T4 anterior wedge compression deformities.
3. Redemonstrated chronic T2 spinous process fracture.

## 2020-08-14 ENCOUNTER — Ambulatory Visit: Payer: Medicare Other | Admitting: Nurse Practitioner

## 2020-08-17 NOTE — Progress Notes (Signed)
Cardiology Office Note    Date:  08/20/2020   ID:  Monica Downs, DOB 08-01-1941, MRN [id number]  PCP:  Wilford CornerWhitaker, Jason Hestle, PA-C  Cardiologist:  Lorine BearsMuhammad Arida, MD  Electrophysiologist:  None   Chief Complaint: Follow up  History of Present Illness:   Monica Downs is a 79 y.o. female with history of CAD status post prior LAD stenting, diastolic dysfunction, DVT, HTN, HLD, mitral regurgitation, hypothyroidism, COVID-19 in 08/2019, multiple falls with traumatic brain injury and history of subarachnoid hemorrhage in 04/2016 as well as fracture of C1, progressive severe dementia with behavioral disturbance, anemia, and OSA intolerant to CPAP who presents for follow-up of her CAD.  She has a history of CAD status post PCI/DES to the LAD in 07/2007.  She was evaluated in 09/2014 with progressive fatigue and underwent stress testing which was nonischemic.  Echo in 01/2018 showed an EF of 60 to 65%, no regional wall motion abnormalities, grade 2 diastolic dysfunction, mild aortic insufficiency and mitral regurgitation, mildly dilated RV cavity size with normal RV systolic function, mild right atrial dilatation, moderate tricuspid regurgitation, and normal PASP.  She was last seen in the office in 08/2018 and was doing well from a cardiac perspective, living locally with her daughter and remained relatively active without symptoms suggestive of angina.  Since she was last seen in our office she has had multiple ER visits and hospital admissions for varying issues including multiple falls complicated by closed head injury, C1 fracture, shoulder subluxation, and progressive dementia with behavioral disturbance/agitation  She was admitted to the hospital in 09/2019 with metabolic encephalopathy felt to be secondary to acute cystitis in the setting of advanced dementia.  High-sensitivity troponin was mildly elevated, peaking at 47, and felt to be consistent with demand ischemia.  Echo, as read by  outside cardiology group, showed an EF of 55 to 60%, normal RV systolic function and ventricular cavity size, mild mitral regurgitation, and trivial aortic insufficiency.  She comes in accompanied by her daughter today who provides the history secondary to the patient's underlying dementia with subsequent aphasia.  The patient has done well from a cardiac perspective since she was last seen.  She has not exhibited any signs or symptoms of chest pain or dyspnea.  No syncopal episodes.  She continues to have a good appetite and is able to feed herself.  No lower extremity swelling, abdominal distention, orthopnea.  She is tolerating aspirin and atorvastatin without issues.  The patient's daughter does not have any cardiac concerns at this time.   Labs independently reviewed: 09/2019 - Hgb 10.3, PLT 251, potassium 3.6, BUN 15, serum creatinine 0.61, BUN 3.3, AST/ALT normal 05/2019 - magnesium 1.9 01/2019 - TC 164, TG 72, HDL 59, LDL 91, A1c 5.4, TSH 6.96, free T4 0.77, T3 normal  Past Medical History:  Diagnosis Date  . Allergic rhinitis   . Anxiety   . CAD (coronary artery disease)    a. 07/2007 Cath/PCI: LM nl, LAD 99p (3.0 x 18 Xience EES), D1 90ost, LCX nl, RCA nl;  b. 09/2014 MV: EF 72%, attenuation artifact, no ischemia.  . Clotting disorder (HCC)    LEGS;throat  . Falls   . History of subarachnoid hemorrhage    a. 04/2016 In setting of fall-->traumatic brain injury.  Marland Kitchen. HLD (hyperlipidemia)   . Hypertension   . Hypothyroidism   . Incontinence   . Left Forearm AV Fistula    a. Asymptomatic.  No blood draws/IV's;  b. 06/2013  confirmed by L FA duplex.  . Mitral regurgitation    a. 02/2011 Echo: EF 72%, trace TR, mild MR/AI; b. 01/2018 Echo: EF 60-65%, no rwma, Gr2 DD, mild AI/MR. Nl RV fxn. Mildly dil RV/RA. Mod TR. Nl PASP.  Marland Kitchen Obstructive sleep apnea   . Panic attacks   . Traumatic brain injury (HCC)    a. 04/2016 in setting of fall/SAH.    Past Surgical History:  Procedure Laterality  Date  . BUNIONETTE EXCISION    . CARDIAC CATHETERIZATION  07/2007   99% mid LAD stenosis  . CORONARY STENT PLACEMENT  07/2007   Mid LAD: 3.0 X 18 mm Xience stent  . MULTIPLE TOOTH EXTRACTIONS    . THYROIDECTOMY    . TOTAL KNEE ARTHROPLASTY     left    Current Medications: Current Meds  Medication Sig  . aspirin 81 MG tablet Take 81 mg by mouth daily.   Marland Kitchen atorvastatin (LIPITOR) 40 MG tablet TAKE ONE TABLET BY MOUTH EVERY DAY  . Azelastine HCl 137 MCG/SPRAY SOLN Place 1 spray into both nostrils 2 (two) times daily.  . Cholecalciferol (VITAMIN D3) 1000 units CAPS Take 1,000 Units by mouth daily.   Marland Kitchen denosumab (PROLIA) 60 MG/ML SOLN injection Inject 60 mg into the skin every 6 (six) months. Administer in upper arm, thigh, or abdomen  . donepezil (ARICEPT) 10 MG tablet Take 10 mg by mouth daily.   Marland Kitchen ENSURE (ENSURE) Take 237 mLs by mouth 2 (two) times daily between meals.  . ferrous sulfate 325 (65 FE) MG tablet Take 325 mg by mouth daily with breakfast.  . fluticasone (FLONASE) 50 MCG/ACT nasal spray Place 1 spray into both nostrils daily as needed for allergies or rhinitis (congestion).  Marland Kitchen levothyroxine (SYNTHROID, LEVOTHROID) 75 MCG tablet Take 75 mcg by mouth daily before breakfast.   . Multiple Vitamin (MULTIVITAMIN WITH MINERALS) TABS tablet Take 1 tablet by mouth daily.  . mupirocin ointment (BACTROBAN) 2 % Apply 1 application topically 2 (two) times daily. (apply to skin tears)   . sertraline (ZOLOFT) 25 MG tablet Take 25 mg by mouth daily.   . traZODone (DESYREL) 50 MG tablet Take 50 mg by mouth at bedtime.    Allergies:   Azithromycin, Hydrocodone-acetaminophen, Mobic [meloxicam], Ondansetron hcl, Penicillins, Percocet [oxycodone-acetaminophen], Reclast [zoledronic acid], Vicodin [hydrocodone-acetaminophen], Codeine, Ibandronic acid, and Oxycodone   Social History   Socioeconomic History  . Marital status: Widowed    Spouse name: Not on file  . Number of children: 1  .  Years of education: Not on file  . Highest education level: Not on file  Occupational History  . Occupation: retired  Tobacco Use  . Smoking status: Former Smoker    Packs/day: 0.10    Years: 2.00    Pack years: 0.20    Types: Cigarettes    Quit date: 10/04/1991    Years since quitting: 28.8  . Smokeless tobacco: Never Used  . Tobacco comment: smoked on and off  Substance and Sexual Activity  . Alcohol use: No  . Drug use: No  . Sexual activity: Not on file  Other Topics Concern  . Not on file  Social History Narrative  . Not on file   Social Determinants of Health   Financial Resource Strain:   . Difficulty of Paying Living Expenses: Not on file  Food Insecurity:   . Worried About Programme researcher, broadcasting/film/video in the Last Year: Not on file  . Ran Out of Food in the  Last Year: Not on file  Transportation Needs:   . Lack of Transportation (Medical): Not on file  . Lack of Transportation (Non-Medical): Not on file  Physical Activity:   . Days of Exercise per Week: Not on file  . Minutes of Exercise per Session: Not on file  Stress:   . Feeling of Stress : Not on file  Social Connections:   . Frequency of Communication with Friends and Family: Not on file  . Frequency of Social Gatherings with Friends and Family: Not on file  . Attends Religious Services: Not on file  . Active Member of Clubs or Organizations: Not on file  . Attends Banker Meetings: Not on file  . Marital Status: Not on file     Family History:  The patient's family history includes Colon cancer in her father and another family member; Heart disease in her mother and another family member; Hyperlipidemia in her mother; Lymphoma in an other family member; Tuberculosis in her mother. There is no history of Breast cancer.  ROS:   Review of Systems  Unable to perform ROS: Dementia     EKGs/Labs/Other Studies Reviewed:    Studies reviewed were summarized above. The additional studies were reviewed  today:  2D echo 09/2019: 1. Left ventricular ejection fraction, by visual estimation, is 55 to  60%. The left ventricle has normal function. There is no left ventricular  hypertrophy.  2. Global right ventricle has normal systolic function.The right  ventricular size is normal. No increase in right ventricular wall  thickness.  3. Left atrial size was normal.  4. Right atrial size was not assessed.  5. The mitral valve is normal in structure. Mild mitral valve  regurgitation.  6. The tricuspid valve is not assessed. Tricuspid valve regurgitation is  not demonstrated.  7. The aortic valve is normal in structure. Aortic valve regurgitation is  trivial.  8. The pulmonic valve was not assessed. Pulmonic valve regurgitation is  not visualized.  9. The interatrial septum was not assessed. __________  2D echo 01/2018: - Left ventricle: The cavity size was normal. Systolic function was  normal. The estimated ejection fraction was in the range of 60%  to 65%. Wall motion was normal; there were no regional wall  motion abnormalities. Features are consistent with a pseudonormal  left ventricular filling pattern, with concomitant abnormal  relaxation and increased filling pressure (grade 2 diastolic  dysfunction).  - Aortic valve: There was mild regurgitation.  - Mitral valve: There was mild regurgitation.  - Left atrium: The atrium was normal in size.  - Right ventricle: The cavity size was mildly dilated. Wall  thickness was normal. Systolic function was normal.  - Right atrium: The atrium was mildly dilated.  - Tricuspid valve: There was moderate regurgitation.  - Pulmonary arteries: Systolic pressure was within the normal  range.  __________  Nuclear stress test 09/2014: No significant ischemia, GI uptake artifact noted, EF 69%, overall low risk scan   EKG:  EKG is ordered today.  The EKG ordered today demonstrates NSR, 71 bpm, RBBB, baseline artifact  secondary to underlying tremor  Recent Labs: 09/04/2019: ALT 15 09/22/2019: BUN 15; Creatinine, Ser 0.61; Hemoglobin 10.3; Platelets 251; Potassium 3.6; Sodium 138  Recent Lipid Panel    Component Value Date/Time   CHOL 163 02/19/2015 0939   TRIG 64 02/19/2015 0939   HDL 75 02/19/2015 0939   CHOLHDL 2.2 02/19/2015 0939   LDLCALC 75 02/19/2015 0939    PHYSICAL  EXAM:    VS:  BP (!) 112/52 (BP Location: Left Arm, Patient Position: Sitting, Cuff Size: Normal)   Pulse 71   Ht 5' (1.524 m)   SpO2 96%   BMI 21.61 kg/m   BMI: Body mass index is 21.61 kg/m.  Physical Exam Vitals reviewed.  Constitutional:      Appearance: She is well-developed.     Comments: Elderly and frail-appearing  HENT:     Head: Normocephalic and atraumatic.  Eyes:     General:        Right eye: No discharge.        Left eye: No discharge.  Neck:     Vascular: No JVD.  Cardiovascular:     Rate and Rhythm: Normal rate and regular rhythm.     Pulses: No midsystolic click and no opening snap.          Posterior tibial pulses are 2+ on the right side and 2+ on the left side.     Heart sounds: Normal heart sounds, S1 normal and S2 normal. Heart sounds not distant. No murmur heard.  No friction rub.  Pulmonary:     Effort: Pulmonary effort is normal. No respiratory distress.     Breath sounds: Normal breath sounds. No decreased breath sounds, wheezing or rales.  Chest:     Chest wall: No tenderness.  Abdominal:     General: There is no distension.     Palpations: Abdomen is soft.     Tenderness: There is no abdominal tenderness.  Musculoskeletal:     Cervical back: Normal range of motion.  Skin:    General: Skin is warm and dry.     Nails: There is no clubbing.  Neurological:     Mental Status: She is alert and oriented to person, place, and time.  Psychiatric:        Speech: Speech normal.        Behavior: Behavior normal.        Thought Content: Thought content normal.        Judgment:  Judgment normal.     Wt Readings from Last 3 Encounters:  10/17/19 110 lb 10.7 oz (50.2 kg)  09/22/19 110 lb 11.2 oz (50.2 kg)  09/03/19 99 lb 3.3 oz (45 kg)     ASSESSMENT & PLAN:   1. CAD involving the native coronary arteries without angina: She is doing well without any symptoms concerning for angina.  She is status post LAD stenting in 2008 with subsequent low risk Myoview in 09/2014.  EKG is stable when compared to prior.  She remains on aspirin and atorvastatin.  No indication for ischemic testing at this time.  2. Diastolic dysfunction: She appears euvolemic and well compensated.  Continue optimal blood pressure control.  She is not requiring standing diuretic.  3. HTN: Blood pressure is well controlled not requiring antihypertensive therapy.  4. HLD: LDL 91 from 01/2019.  She remains on atorvastatin and is followed by her living facility.  5. Mitral regurgitation: No murmur noted on exam today.  Given her advanced age and significant comorbid conditions even if she had progressive valvulopathy on echo I do not think she would be a candidate for intervention.  6. Frequent falls: Mostly wheelchair-bound at this time.  Continue to work with staff at her living facility.  7. Severe progressive dementia: Per living facility.  Disposition: F/u with Dr. Kirke Corin or an APP in 12 months, sooner if needed.   Medication Adjustments/Labs and Tests  Ordered: Current medicines are reviewed at length with the patient today.  Concerns regarding medicines are outlined above. Medication changes, Labs and Tests ordered today are summarized above and listed in the Patient Instructions accessible in Encounters.   Signed, Eula Listen, PA-C 08/20/2020 3:52 PM     [facility] [place] [address] [place], [place] [address] [phone]

## 2020-08-20 ENCOUNTER — Encounter: Payer: Self-pay | Admitting: Physician Assistant

## 2020-08-20 ENCOUNTER — Other Ambulatory Visit: Payer: Self-pay

## 2020-08-20 ENCOUNTER — Ambulatory Visit (INDEPENDENT_AMBULATORY_CARE_PROVIDER_SITE_OTHER): Payer: Medicare Other | Admitting: Physician Assistant

## 2020-08-20 VITALS — BP 112/52 | HR 71 | Ht 60.0 in

## 2020-08-20 DIAGNOSIS — I10 Essential (primary) hypertension: Secondary | ICD-10-CM | POA: Diagnosis not present

## 2020-08-20 DIAGNOSIS — I2510 Atherosclerotic heart disease of native coronary artery without angina pectoris: Secondary | ICD-10-CM

## 2020-08-20 DIAGNOSIS — R296 Repeated falls: Secondary | ICD-10-CM

## 2020-08-20 DIAGNOSIS — E785 Hyperlipidemia, unspecified: Secondary | ICD-10-CM | POA: Diagnosis not present

## 2020-08-20 DIAGNOSIS — I5189 Other ill-defined heart diseases: Secondary | ICD-10-CM

## 2020-08-20 DIAGNOSIS — I340 Nonrheumatic mitral (valve) insufficiency: Secondary | ICD-10-CM

## 2020-08-20 DIAGNOSIS — F0390 Unspecified dementia without behavioral disturbance: Secondary | ICD-10-CM

## 2020-08-20 NOTE — Patient Instructions (Signed)
Medication Instructions:   1. Your physician recommends that you continue on your current medications as directed. Please refer to the Current Medication list given to you today.  *If you need a refill on your cardiac medications before your next appointment, please call your pharmacy*   Lab Work:  1. None Ordered  If you have labs (blood work) drawn today and your tests are completely normal, you will receive your results only by: . MyChart Message (if you have MyChart) OR . A paper copy in the mail If you have any lab test that is abnormal or we need to change your treatment, we will call you to review the results.   Testing/Procedures:  1. None Ordered   Follow-Up: At [facility], you and your health needs are our priority.  As part of our continuing mission to provide you with exceptional heart care, we have created designated Provider Care Teams.  These Care Teams include your primary Cardiologist (physician) and Advanced Practice Providers (APPs -  Physician Assistants and Nurse Practitioners) who all work together to provide you with the care you need, when you need it.  We recommend signing up for the patient portal called "MyChart".  Sign up information is provided on this After Visit Summary.  MyChart is used to connect with patients for Virtual Visits (Telemedicine).  Patients are able to view lab/test results, encounter notes, upcoming appointments, etc.  Non-urgent messages can be sent to your provider as well.   To learn more about what you can do with MyChart, go to [email or web address].    Your next appointment:   12 month(s)  The format for your next appointment:   In Person  Provider:   You may see Muhammad Arida, MD or one of the following Advanced Practice Providers on your designated Care Team:     Ryan Dunn, PA-C  

## 2020-12-16 ENCOUNTER — Emergency Department
Admission: EM | Admit: 2020-12-16 | Discharge: 2020-12-16 | Disposition: A | Discharge status: Home / Self Care | Payer: Medicare Other | Attending: Emergency Medicine | Admitting: Emergency Medicine

## 2020-12-16 ENCOUNTER — Other Ambulatory Visit: Payer: Self-pay

## 2020-12-16 ENCOUNTER — Encounter: Payer: Self-pay | Admitting: Emergency Medicine

## 2020-12-16 ENCOUNTER — Emergency Department: Payer: Medicare Other

## 2020-12-16 DIAGNOSIS — E039 Hypothyroidism, unspecified: Secondary | ICD-10-CM | POA: Insufficient documentation

## 2020-12-16 DIAGNOSIS — I2510 Atherosclerotic heart disease of native coronary artery without angina pectoris: Secondary | ICD-10-CM | POA: Diagnosis not present

## 2020-12-16 DIAGNOSIS — W19XXXA Unspecified fall, initial encounter: Secondary | ICD-10-CM | POA: Diagnosis not present

## 2020-12-16 DIAGNOSIS — F0391 Unspecified dementia with behavioral disturbance: Secondary | ICD-10-CM | POA: Diagnosis not present

## 2020-12-16 DIAGNOSIS — Z96652 Presence of left artificial knee joint: Secondary | ICD-10-CM | POA: Insufficient documentation

## 2020-12-16 DIAGNOSIS — Z8616 Personal history of COVID-19: Secondary | ICD-10-CM | POA: Insufficient documentation

## 2020-12-16 DIAGNOSIS — Z87891 Personal history of nicotine dependence: Secondary | ICD-10-CM | POA: Diagnosis not present

## 2020-12-16 DIAGNOSIS — Z23 Encounter for immunization: Secondary | ICD-10-CM | POA: Diagnosis not present

## 2020-12-16 DIAGNOSIS — I119 Hypertensive heart disease without heart failure: Secondary | ICD-10-CM | POA: Diagnosis not present

## 2020-12-16 DIAGNOSIS — S4992XA Unspecified injury of left shoulder and upper arm, initial encounter: Secondary | ICD-10-CM | POA: Diagnosis present

## 2020-12-16 DIAGNOSIS — Z79899 Other long term (current) drug therapy: Secondary | ICD-10-CM | POA: Insufficient documentation

## 2020-12-16 DIAGNOSIS — Z7982 Long term (current) use of aspirin: Secondary | ICD-10-CM | POA: Diagnosis not present

## 2020-12-16 DIAGNOSIS — S41112A Laceration without foreign body of left upper arm, initial encounter: Secondary | ICD-10-CM | POA: Diagnosis not present

## 2020-12-16 DIAGNOSIS — S12091G Other nondisplaced fracture of first cervical vertebra, subsequent encounter for fracture with delayed healing: Secondary | ICD-10-CM | POA: Diagnosis not present

## 2020-12-16 MED ORDER — TETANUS-DIPHTH-ACELL PERTUSSIS 5-2.5-18.5 LF-MCG/0.5 IM SUSY
0.5000 mL | PREFILLED_SYRINGE | Freq: Once | INTRAMUSCULAR | Status: AC
  Administered 2020-12-16: 0.5 mL via INTRAMUSCULAR
  Filled 2020-12-16: qty 0.5

## 2020-12-16 NOTE — Discharge Instructions (Addendum)
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 If she begins to complain of pain of her upper neck, I suggest notifying her MD for further imaging and possible referral if indicated. The treatment would mostly be a cervical collar, which has its own risks given her age and likely intolerance.

## 2020-12-16 NOTE — ED Notes (Signed)
Attempted to call report to [facility] healthcare

## 2020-12-16 NOTE — ED Provider Notes (Signed)
[facility] Emergency Department Provider Note  ____________________________________________   Event Date/Time   First MD Initiated Contact with Patient 12/16/20 1746     (approximate)  I have reviewed the triage vital signs and the nursing notes.   HISTORY  Chief Complaint Fall    HPI Monica NonesLinda S Downs is a 80 y.o. female  Here with fall. 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 Pt had unwitnessed fall at her facility. Was found down with superficial skin tears to left hand and wrist. No apparent head trauma. Not on anticoagulation.   Level 5 caveat invoked as remainder of history, ROS, and physical exam limited due to patient's dementia         Past Medical History:  Diagnosis Date  . Allergic rhinitis   . Anxiety   . CAD (coronary artery disease)    a. 07/2007 Cath/PCI: LM nl, LAD 99p (3.0 x 18 Xience EES), D1 90ost, LCX nl, RCA nl;  b. 09/2014 MV: EF 72%, attenuation artifact, no ischemia.  . Clotting disorder (HCC)    LEGS;throat  . Falls   . History of subarachnoid hemorrhage    a. 04/2016 In setting of fall-->traumatic brain injury.  Marland Kitchen. HLD (hyperlipidemia)   . Hypertension   . Hypothyroidism   . Incontinence   . Left Forearm AV Fistula    a. Asymptomatic.  No blood draws/IV's;  b. 06/2013 confirmed by L FA duplex.  . Mitral regurgitation    a. 02/2011 Echo: EF 72%, trace TR, mild MR/AI; b. 01/2018 Echo: EF 60-65%, no rwma, Gr2 DD, mild AI/MR. Nl RV fxn. Mildly dil RV/RA. Mod TR. Nl PASP.  Marland Kitchen. Obstructive sleep apnea   . Panic attacks   . Traumatic brain injury (HCC)    a. 04/2016 in setting of fall/SAH.    Patient Active Problem List   Diagnosis Date Noted  . Altered mental status 09/04/2019  . Acute cystitis with hematuria   . Nondisplaced posterior arch fracture of first cervical vertebra with routine healing   . COVID-19   . Fall   . Hypokalemia 09/03/2019  . Acute lower UTI 09/03/2019  . AMS (altered mental status)  09/03/2019  . Elevated troponin 09/03/2019  . Unspecified dementia with behavioral disturbance (HCC) 01/02/2019  . Traumatic subarachnoid hemorrhage (HCC) 04/11/2016  . 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 CAD (coronary artery disease)   . HLD (hyperlipidemia)     Past Surgical History:  Procedure Laterality Date  . BUNIONETTE EXCISION    . CARDIAC CATHETERIZATION  07/2007   99% mid LAD stenosis  . CORONARY STENT PLACEMENT  07/2007   Mid LAD: 3.0 X 18 mm Xience stent  . MULTIPLE TOOTH EXTRACTIONS    . THYROIDECTOMY    . TOTAL KNEE ARTHROPLASTY     left    Prior to Admission medications   Medication Sig Start Date End Date Taking? Authorizing Provider  aspirin 81 MG tablet Take 81 mg by mouth daily.     [provider]  atorvastatin (LIPITOR) 40 MG tablet TAKE ONE TABLET BY MOUTH EVERY DAY 12/14/18   Iran OuchArida, Muhammad A, MD  Azelastine HCl 137 MCG/SPRAY SOLN Place 1 spray into both nostrils 2 (two) times daily.    [provider]  Cholecalciferol (VITAMIN D3) 1000 units CAPS Take 1,000 Units by mouth daily.     [provider]  denosumab (PROLIA) 60 MG/ML SOLN injection Inject 60 mg into the skin every  6 (six) months. Administer in upper arm, thigh, or abdomen    [provider]  donepezil (ARICEPT) 10 MG tablet Take 10 mg by mouth daily.     [provider]  ENSURE (ENSURE) Take 237 mLs by mouth 2 (two) times daily between meals.    [provider]  ferrous sulfate 325 (65 FE) MG tablet Take 325 mg by mouth daily with breakfast.    [provider]  fluticasone (FLONASE) 50 MCG/ACT nasal spray Place 1 spray into both nostrils daily as needed for allergies or rhinitis (congestion).    [provider]  levothyroxine (SYNTHROID, LEVOTHROID) 75 MCG tablet Take 75 mcg by mouth daily before breakfast.  01/08/16   [provider]  Multiple Vitamin (MULTIVITAMIN WITH  MINERALS) TABS tablet Take 1 tablet by mouth daily. 09/06/19   Enedina Finner, MD  mupirocin ointment (BACTROBAN) 2 % Apply 1 application topically 2 (two) times daily. (apply to skin tears)     [provider]  sertraline (ZOLOFT) 25 MG tablet Take 25 mg by mouth daily.  08/11/20   [provider]  traZODone (DESYREL) 50 MG tablet Take 50 mg by mouth at bedtime.    [provider]    Allergies Azithromycin, Hydrocodone-acetaminophen, Mobic [meloxicam], Ondansetron hcl, Penicillins, Percocet [oxycodone-acetaminophen], Reclast [zoledronic acid], Vicodin [hydrocodone-acetaminophen], Codeine, Ibandronic acid, and Oxycodone  Family History  Problem Relation Age of Onset  . Heart disease Mother   . Hyperlipidemia Mother   . Tuberculosis Mother   . Colon cancer Father   . Colon cancer Other   . Lymphoma Other   . Heart disease Other   . Breast cancer Neg Hx     Social History Social History   Tobacco Use  . Smoking status: Former Smoker    Packs/day: 0.10    Years: 2.00    Pack years: 0.20    Types: Cigarettes    Quit date: 10/04/1991    Years since quitting: 29.2  . Smokeless tobacco: Never Used  . Tobacco comment: smoked on and off  Substance Use Topics  . Alcohol use: No  . Drug use: No    Review of Systems  Review of Systems  Unable to perform ROS: Dementia     ____________________________________________  PHYSICAL EXAM:      VITAL SIGNS: ED Triage Vitals  Enc Vitals Group     BP 12/16/20 1756 122/69     Pulse Rate 12/16/20 1756 79     Resp 12/16/20 1756 20     Temp 12/16/20 1756 98.4 F (36.9 C)     Temp Source 12/16/20 1756 Oral     SpO2 12/16/20 1756 96 %     Weight 12/16/20 1800 135 lb (61.2 kg)     Height 12/16/20 1800 5\' 4"  (1.626 m)     Head Circumference --      Peak Flow --      Pain Score --      Pain Loc --      Pain Edu? --      Excl. in GC? --      Physical Exam Vitals and nursing note reviewed.  Constitutional:       General: She is not in acute distress.    Appearance: She is well-developed.  HENT:     Head: Normocephalic and atraumatic.  Eyes:     Conjunctiva/sclera: Conjunctivae normal.  Cardiovascular:     Rate and Rhythm: Normal rate and regular rhythm.  Heart sounds: Normal heart sounds.  Pulmonary:     Effort: Pulmonary effort is normal. No respiratory distress.     Breath sounds: No wheezing.  Abdominal:     General: There is no distension.  Musculoskeletal:     Cervical back: Neck supple.  Skin:    General: Skin is warm.     Capillary Refill: Capillary refill takes less than 2 seconds.     Findings: No rash.     Comments: Superficial skin tears to L hand and arm, no deep lacerations, no deep or bleeding wounds.  Neurological:     Mental Status: She is alert and oriented to person, place, and time.     Motor: No abnormal muscle tone.       ____________________________________________   LABS (all labs ordered are listed, but only abnormal results are displayed)  Labs Reviewed - No data to display  ____________________________________________  EKG: normal sinus rhythm, VR 76. QRS 135, QTc 490. RBBB. No acute ST elevation or depression.  ________________________________________  RADIOLOGY All imaging, including plain films, CT scans, and ultrasounds, independently reviewed by me, and interpretations confirmed via formal radiology reads.  ED MD interpretation:   CT Head: NAICA CT C Spine: Chronic C1 fx, subacute vs acute posterior arch fx  Official radiology report(s): CT Head Wo Contrast  Result Date: 12/16/2020 CLINICAL DATA:  80 year old female with history of head trauma. EXAM: CT HEAD WITHOUT CONTRAST TECHNIQUE: Contiguous axial images were obtained from the base of the skull through the vertex without intravenous contrast. COMPARISON:  Head CT 10/17/2019. FINDINGS: Brain: Mild cerebral atrophy. Patchy and confluent areas of decreased attenuation are noted  throughout the deep and periventricular white matter of the cerebral hemispheres bilaterally, compatible with chronic microvascular ischemic disease. No evidence of acute infarction, hemorrhage, hydrocephalus, extra-axial collection or mass lesion/mass effect. Vascular: No hyperdense vessel or unexpected calcification. Skull: Normal. Negative for fracture or focal lesion. Sinuses/Orbits: No acute finding. Other: None. IMPRESSION: 1. No evidence of significant acute traumatic injury to the skull or brain. 2. Mild cerebral atrophy with severe chronic microvascular ischemic changes in the cerebral white matter, as above. Electronically Signed   By: Trudie Reed M.D.   On: 12/16/2020 18:38   CT Cervical Spine Wo Contrast  Result Date: 12/16/2020 CLINICAL DATA:  Neck trauma unwitnessed fall EXAM: CT CERVICAL SPINE WITHOUT CONTRAST TECHNIQUE: Multidetector CT imaging of the cervical spine was performed without intravenous contrast. Multiplanar CT image reconstructions were also generated. COMPARISON:  CT 10/17/2019, 09/22/2019, 09/03/2019 FINDINGS: Alignment: Stable 3 mm anterolisthesis C3 on C4 and 4 mm anterolisthesis C4 on C5. Chronic malalignment at the clivus and tip of dens. Facet alignment is maintained. Skull base and vertebrae: Chronic fracture through the anterior arch of C1 without significant change in alignment. Now seen is a nondisplaced fracture through the left posterior arch of C1, series 2, image number 16, series 8, image number 30. Potential mildly sclerotic margins. Chronic superior endplate deformity at T3. Soft tissues and spinal canal: No prevertebral fluid or swelling. No visible canal hematoma. Disc levels: Moderate severe degenerative changes at multiple levels. Hypertrophic facet degenerative change throughout the cervical spine. Upper chest: Negative. Other: None IMPRESSION: 1. Chronic fracture through the anterior arch of C1 with interval finding of acute to subacute nondisplaced  fracture at the left posterior arch of C1. Overall cervical alignment is grossly unchanged. 2. Multiple level degenerative change Critical Value/emergent results were called by telephone at the time of interpretation on 12/16/2020 at 7:39  pm to provider Shaune Pollack , who verbally acknowledged these results. Electronically Signed   By: Jasmine Pang M.D.   On: 12/16/2020 19:39    ____________________________________________  PROCEDURES   Procedure(s) performed (including Critical Care):  Procedures  ____________________________________________  INITIAL IMPRESSION / MDM / ASSESSMENT AND PLAN / ED COURSE  As part of my medical decision making, I reviewed the following data within the electronic MEDICAL RECORD NUMBER Nursing notes reviewed and incorporated, Old chart reviewed, Notes from prior ED visits, and [place] Controlled Substance Database       *Monica Downs was evaluated in Emergency Department on 12/16/2020 for the symptoms described in the history of present illness. She was evaluated in the context of the global COVID-19 pandemic, which necessitated consideration that the patient might be at risk for infection with the SARS-CoV-2 virus that causes COVID-19. Institutional protocols and algorithms that pertain to the evaluation of patients at risk for COVID-19 are in a state of rapid change based on information released by regulatory bodies including the CDC and federal and state organizations. These policies and algorithms were followed during the patient's care in the ED.  Some ED evaluations and interventions may be delayed as a result of limited staffing during the pandemic.*     Medical Decision Making:  80 yo F here with fall. Superficial skin tears noted on L forearm. Tetanus updated and wounds cleaned. CT imaging obtained, shows likely chronic C1 fx. There is mention of acute to subacute posterior element fx. Pt has no TTP here on serial exams. Discussed with Dr. Myer Haff. Given  absence of any TTP, and likely chronic appearance on CT, will advise supportive care. Does not need collar and given her dementia and prior intolerance of collars, feel this is reasonable. Will d/c to facility with instructions to follow-up if pt begins to c/o pain in the area/appears to be in pain with head movement.  ____________________________________________  FINAL CLINICAL IMPRESSION(S) / ED DIAGNOSES  Final diagnoses:  Fall, initial encounter  Skin tear of left upper arm without complication, initial encounter  Other closed nondisplaced fracture of first cervical vertebra with delayed healing, subsequent encounter     MEDICATIONS GIVEN DURING THIS VISIT:  Medications  Tdap (BOOSTRIX) injection 0.5 mL (0.5 mLs Intramuscular Given 12/16/20 1910)     ED Discharge Orders    None       Note:  This document was prepared using Dragon voice recognition software and may include unintentional dictation errors.   Shaune Pollack, MD 12/16/20 2037

## 2020-12-16 NOTE — ED Notes (Signed)
This RN attempted to call report to [facility].

## 2020-12-16 NOTE — ED Triage Notes (Signed)
Pt to ED via EMS from [facility]. Per EMS, Pt had unwitnessed fall. Pt has skin tear to left forearm. Pt does not speak but is alert to painful stimuli. EMS vitals were WDL.

## 2020-12-16 NOTE — ED Notes (Signed)
Verbal consent from daughter to transport pt back to [facility] via EMS.

## 2020-12-16 NOTE — ED Notes (Signed)
Attempted to call report to [facility] 

## 2021-01-23 ENCOUNTER — Inpatient Hospital Stay
Admission: EM | Admit: 2021-01-23 | Discharge: 2021-01-26 | DRG: 070 | Disposition: A | Discharge status: Skilled Nursing Facility | Payer: Medicare Other | Attending: Internal Medicine | Admitting: Internal Medicine

## 2021-01-23 ENCOUNTER — Observation Stay: Payer: Medicare Other

## 2021-01-23 ENCOUNTER — Emergency Department: Payer: Medicare Other

## 2021-01-23 ENCOUNTER — Other Ambulatory Visit: Payer: Self-pay

## 2021-01-23 DIAGNOSIS — R404 Transient alteration of awareness: Secondary | ICD-10-CM

## 2021-01-23 DIAGNOSIS — G4733 Obstructive sleep apnea (adult) (pediatric): Secondary | ICD-10-CM | POA: Diagnosis present

## 2021-01-23 DIAGNOSIS — Z79899 Other long term (current) drug therapy: Secondary | ICD-10-CM

## 2021-01-23 DIAGNOSIS — R0600 Dyspnea, unspecified: Secondary | ICD-10-CM

## 2021-01-23 DIAGNOSIS — J189 Pneumonia, unspecified organism: Secondary | ICD-10-CM | POA: Diagnosis present

## 2021-01-23 DIAGNOSIS — Z8673 Personal history of transient ischemic attack (TIA), and cerebral infarction without residual deficits: Secondary | ICD-10-CM

## 2021-01-23 DIAGNOSIS — Z79890 Hormone replacement therapy (postmenopausal): Secondary | ICD-10-CM

## 2021-01-23 DIAGNOSIS — R4182 Altered mental status, unspecified: Secondary | ICD-10-CM | POA: Diagnosis present

## 2021-01-23 DIAGNOSIS — Z20822 Contact with and (suspected) exposure to covid-19: Secondary | ICD-10-CM | POA: Diagnosis present

## 2021-01-23 DIAGNOSIS — R636 Underweight: Secondary | ICD-10-CM

## 2021-01-23 DIAGNOSIS — Z96652 Presence of left artificial knee joint: Secondary | ICD-10-CM | POA: Diagnosis present

## 2021-01-23 DIAGNOSIS — E039 Hypothyroidism, unspecified: Secondary | ICD-10-CM | POA: Diagnosis present

## 2021-01-23 DIAGNOSIS — Z87891 Personal history of nicotine dependence: Secondary | ICD-10-CM

## 2021-01-23 DIAGNOSIS — Z8249 Family history of ischemic heart disease and other diseases of the circulatory system: Secondary | ICD-10-CM

## 2021-01-23 DIAGNOSIS — Z7982 Long term (current) use of aspirin: Secondary | ICD-10-CM

## 2021-01-23 DIAGNOSIS — S069XAA Unspecified intracranial injury with loss of consciousness status unknown, initial encounter: Secondary | ICD-10-CM | POA: Insufficient documentation

## 2021-01-23 DIAGNOSIS — Z83438 Family history of other disorder of lipoprotein metabolism and other lipidemia: Secondary | ICD-10-CM

## 2021-01-23 DIAGNOSIS — Z8616 Personal history of COVID-19: Secondary | ICD-10-CM

## 2021-01-23 DIAGNOSIS — S069X9A Unspecified intracranial injury with loss of consciousness of unspecified duration, initial encounter: Secondary | ICD-10-CM | POA: Insufficient documentation

## 2021-01-23 DIAGNOSIS — I2510 Atherosclerotic heart disease of native coronary artery without angina pectoris: Secondary | ICD-10-CM | POA: Diagnosis present

## 2021-01-23 DIAGNOSIS — E785 Hyperlipidemia, unspecified: Secondary | ICD-10-CM | POA: Diagnosis present

## 2021-01-23 DIAGNOSIS — R532 Functional quadriplegia: Secondary | ICD-10-CM | POA: Diagnosis present

## 2021-01-23 DIAGNOSIS — E871 Hypo-osmolality and hyponatremia: Principal | ICD-10-CM

## 2021-01-23 DIAGNOSIS — Z7401 Bed confinement status: Secondary | ICD-10-CM

## 2021-01-23 DIAGNOSIS — F03C0 Unspecified dementia, severe, without behavioral disturbance, psychotic disturbance, mood disturbance, and anxiety: Secondary | ICD-10-CM

## 2021-01-23 DIAGNOSIS — E890 Postprocedural hypothyroidism: Secondary | ICD-10-CM | POA: Diagnosis present

## 2021-01-23 DIAGNOSIS — Z880 Allergy status to penicillin: Secondary | ICD-10-CM

## 2021-01-23 DIAGNOSIS — Z955 Presence of coronary angioplasty implant and graft: Secondary | ICD-10-CM

## 2021-01-23 DIAGNOSIS — Z881 Allergy status to other antibiotic agents status: Secondary | ICD-10-CM

## 2021-01-23 DIAGNOSIS — G9341 Metabolic encephalopathy: Principal | ICD-10-CM | POA: Diagnosis present

## 2021-01-23 DIAGNOSIS — I4510 Unspecified right bundle-branch block: Secondary | ICD-10-CM | POA: Diagnosis present

## 2021-01-23 DIAGNOSIS — E861 Hypovolemia: Secondary | ICD-10-CM | POA: Diagnosis present

## 2021-01-23 DIAGNOSIS — Z681 Body mass index (BMI) 19 or less, adult: Secondary | ICD-10-CM

## 2021-01-23 DIAGNOSIS — Z8679 Personal history of other diseases of the circulatory system: Secondary | ICD-10-CM

## 2021-01-23 DIAGNOSIS — Z66 Do not resuscitate: Secondary | ICD-10-CM | POA: Diagnosis present

## 2021-01-23 DIAGNOSIS — Z886 Allergy status to analgesic agent status: Secondary | ICD-10-CM

## 2021-01-23 DIAGNOSIS — I340 Nonrheumatic mitral (valve) insufficiency: Secondary | ICD-10-CM | POA: Diagnosis present

## 2021-01-23 DIAGNOSIS — R1310 Dysphagia, unspecified: Secondary | ICD-10-CM

## 2021-01-23 DIAGNOSIS — E43 Unspecified severe protein-calorie malnutrition: Secondary | ICD-10-CM | POA: Diagnosis present

## 2021-01-23 DIAGNOSIS — Z885 Allergy status to narcotic agent status: Secondary | ICD-10-CM

## 2021-01-23 DIAGNOSIS — I10 Essential (primary) hypertension: Secondary | ICD-10-CM | POA: Diagnosis present

## 2021-01-23 DIAGNOSIS — F0390 Unspecified dementia without behavioral disturbance: Secondary | ICD-10-CM | POA: Diagnosis present

## 2021-01-23 DIAGNOSIS — R55 Syncope and collapse: Secondary | ICD-10-CM

## 2021-01-23 LAB — CBC WITH DIFFERENTIAL/PLATELET
Abs Immature Granulocytes: 0.02 10*3/uL (ref 0.00–0.07)
Basophils Absolute: 0.1 10*3/uL (ref 0.0–0.1)
Basophils Relative: 1 %
Eosinophils Absolute: 0.3 10*3/uL (ref 0.0–0.5)
Eosinophils Relative: 4 %
HCT: 34 % — ABNORMAL LOW (ref 36.0–46.0)
Hemoglobin: 11.6 g/dL — ABNORMAL LOW (ref 12.0–15.0)
Immature Granulocytes: 0 %
Lymphocytes Relative: 32 %
Lymphs Abs: 2.3 10*3/uL (ref 0.7–4.0)
MCH: 31 pg (ref 26.0–34.0)
MCHC: 34.1 g/dL (ref 30.0–36.0)
MCV: 90.9 fL (ref 80.0–100.0)
Monocytes Absolute: 0.6 10*3/uL (ref 0.1–1.0)
Monocytes Relative: 9 %
Neutro Abs: 3.8 10*3/uL (ref 1.7–7.7)
Neutrophils Relative %: 54 %
Platelets: 253 10*3/uL (ref 150–400)
RBC: 3.74 MIL/uL — ABNORMAL LOW (ref 3.87–5.11)
RDW: 13.2 % (ref 11.5–15.5)
WBC: 7.1 10*3/uL (ref 4.0–10.5)
nRBC: 0 % (ref 0.0–0.2)

## 2021-01-23 LAB — BASIC METABOLIC PANEL
Anion gap: 11 (ref 5–15)
Anion gap: 9 (ref 5–15)
BUN: 13 mg/dL (ref 8–23)
BUN: 11 mg/dL (ref 8–23)
CO2: 27 mmol/L (ref 22–32)
CO2: 25 mmol/L (ref 22–32)
Calcium: 8.4 mg/dL — ABNORMAL LOW (ref 8.9–10.3)
Calcium: 8.1 mg/dL — ABNORMAL LOW (ref 8.9–10.3)
Chloride: 87 mmol/L — ABNORMAL LOW (ref 98–111)
Chloride: 93 mmol/L — ABNORMAL LOW (ref 98–111)
Creatinine, Ser: 0.48 mg/dL (ref 0.44–1.00)
Creatinine, Ser: 0.46 mg/dL (ref 0.44–1.00)
GFR, Estimated: >60 mL/min (ref >60)
GFR, Estimated: >60 mL/min (ref >60)
Glucose, Bld: 130 mg/dL — ABNORMAL HIGH (ref 70–99)
Glucose, Bld: 116 mg/dL — ABNORMAL HIGH (ref 70–99)
Potassium: 3.6 mmol/L (ref 3.5–5.1)
Potassium: 3.9 mmol/L (ref 3.5–5.1)
Sodium: 125 mmol/L — ABNORMAL LOW (ref 135–145)
Sodium: 127 mmol/L — ABNORMAL LOW (ref 135–145)

## 2021-01-23 LAB — CBC
HCT: 31.9 % — ABNORMAL LOW (ref 36.0–46.0)
Hemoglobin: 10.7 g/dL — ABNORMAL LOW (ref 12.0–15.0)
MCH: 30.6 pg (ref 26.0–34.0)
MCHC: 33.5 g/dL (ref 30.0–36.0)
MCV: 91.1 fL (ref 80.0–100.0)
Platelets: 236 10*3/uL (ref 150–400)
RBC: 3.5 MIL/uL — ABNORMAL LOW (ref 3.87–5.11)
RDW: 13.2 % (ref 11.5–15.5)
WBC: 9.6 10*3/uL (ref 4.0–10.5)
nRBC: 0 % (ref 0.0–0.2)

## 2021-01-23 LAB — URINALYSIS, COMPLETE (UACMP) WITH MICROSCOPIC
Bacteria, UA: NONE SEEN
Bilirubin Urine: NEGATIVE
Glucose, UA: NEGATIVE mg/dL
Hgb urine dipstick: NEGATIVE
Ketones, ur: 5 mg/dL — AB
Leukocytes,Ua: NEGATIVE
Nitrite: NEGATIVE
Protein, ur: NEGATIVE mg/dL
Specific Gravity, Urine: 1.019 (ref 1.005–1.030)
Squamous Epithelial / HPF: NONE SEEN (ref 0–5)
pH: 5 (ref 5.0–8.0)

## 2021-01-23 LAB — OSMOLALITY, URINE: Osmolality, Ur: 698 mOsm/kg (ref 300–900)

## 2021-01-23 LAB — SODIUM, URINE, RANDOM: Sodium, Ur: 132 mmol/L

## 2021-01-23 LAB — OSMOLALITY: Osmolality: 262 mOsm/kg — ABNORMAL LOW (ref 275–295)

## 2021-01-23 LAB — BRAIN NATRIURETIC PEPTIDE: B Natriuretic Peptide: 16.2 pg/mL (ref 0.0–100.0)

## 2021-01-23 LAB — TROPONIN I (HIGH SENSITIVITY): Troponin I (High Sensitivity): 4 ng/L (ref <18)

## 2021-01-23 MED ORDER — ONDANSETRON HCL 4 MG/2ML IJ SOLN: 4.0000 mg | Freq: Four times a day (QID) | INTRAMUSCULAR | Status: DC | PRN

## 2021-01-23 MED ORDER — ONDANSETRON HCL 4 MG PO TABS: 4.0000 mg | ORAL_TABLET | Freq: Four times a day (QID) | ORAL | Status: DC | PRN

## 2021-01-23 MED ORDER — ENOXAPARIN SODIUM 30 MG/0.3ML ~~LOC~~ SOLN
30.0000 mg | SUBCUTANEOUS | Status: DC
  Administered 2021-01-23 – 2021-01-25 (×3): 30 mg via SUBCUTANEOUS
  Filled 2021-01-23 (×3): qty 0.3

## 2021-01-23 MED ORDER — SODIUM CHLORIDE 0.9 % IV BOLUS
1000.0000 mL | Freq: Once | INTRAVENOUS | Status: AC
  Administered 2021-01-23: 1000 mL via INTRAVENOUS

## 2021-01-23 MED ORDER — SODIUM CHLORIDE 0.9 % IV SOLN: INTRAVENOUS | Status: AC

## 2021-01-23 MED ORDER — PROMETHAZINE HCL 25 MG RE SUPP
12.5000 mg | Freq: Four times a day (QID) | RECTAL | Status: DC | PRN
  Filled 2021-01-23: qty 1

## 2021-01-23 NOTE — ED Provider Notes (Signed)
[facility] Emergency Department Provider Note ____________________________________________   Event Date/Time   First MD Initiated Contact with Patient 01/23/21 1721     (approximate)  I have reviewed the triage vital signs and the nursing notes.   HISTORY  Chief Complaint Shortness of Breath  Level 5 caveat: History of present illness limited due to dementia/nonverbal status  HPI Monica Downs is a 80 y.o. female with PMH as noted below including dementia, CAD, hypertension, and TBI who presents with apparent shortness of breath that was reported by nursing home staff to EMS.  We are unable to obtain any additional history.  The patient herself is nonverbal and is unable to give history.  Past Medical History:  Diagnosis Date  . Allergic rhinitis   . Anxiety   . CAD (coronary artery disease)    a. 07/2007 Cath/PCI: LM nl, LAD 99p (3.0 x 18 Xience EES), D1 90ost, LCX nl, RCA nl;  b. 09/2014 MV: EF 72%, attenuation artifact, no ischemia.  . Clotting disorder (HCC)    LEGS;throat  . Falls   . History of subarachnoid hemorrhage    a. 04/2016 In setting of fall-->traumatic brain injury.  Marland Kitchen HLD (hyperlipidemia)   . Hypertension   . Hypothyroidism   . Incontinence   . Left Forearm AV Fistula    a. Asymptomatic.  No blood draws/IV's;  b. 06/2013 confirmed by L FA duplex.  . Mitral regurgitation    a. 02/2011 Echo: EF 72%, trace TR, mild MR/AI; b. 01/2018 Echo: EF 60-65%, no rwma, Gr2 DD, mild AI/MR. Nl RV fxn. Mildly dil RV/RA. Mod TR. Nl PASP.  Marland Kitchen Obstructive sleep apnea   . Panic attacks   . Traumatic brain injury (HCC)    a. 04/2016 in setting of fall/SAH.    Patient Active Problem List   Diagnosis Date Noted  . TBI (traumatic brain injury) (HCC) 01/23/2021  . Hyponatremia 01/23/2021  . History of subarachnoid hemorrhage 01/23/2021  . Dysphagia 01/23/2021  . Dyspnea 01/23/2021  . Underweight 01/23/2021  . Altered mental status 09/04/2019  . Acute  cystitis with hematuria   . Nondisplaced posterior arch fracture of first cervical vertebra with routine healing   . COVID-19   . Fall   . Hypokalemia 09/03/2019  . Acute lower UTI 09/03/2019  . AMS (altered mental status) 09/03/2019  . Elevated troponin 09/03/2019  . Unspecified dementia with behavioral disturbance (HCC) 01/02/2019  . Advanced dementia (HCC) 12/30/2018  . Essential hypertension 01/26/2018  . History of TIA (transient ischemic attack) 01/26/2018  . Traumatic subarachnoid hemorrhage (HCC) 04/11/2016  . Hypothyroidism, unspecified 01/15/2016  . 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 Atherosclerotic heart disease of native coronary artery without angina pectoris 02/06/2013  . 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 CAD (coronary artery disease)   . HLD (hyperlipidemia)     Past Surgical History:  Procedure Laterality Date  . BUNIONETTE EXCISION    . CARDIAC CATHETERIZATION  07/2007   99% mid LAD stenosis  . CORONARY STENT PLACEMENT  07/2007   Mid LAD: 3.0 X 18 mm Xience stent  . MULTIPLE TOOTH EXTRACTIONS    . THYROIDECTOMY    . TOTAL KNEE ARTHROPLASTY     left    Prior to Admission medications   Medication Sig Start Date End Date Taking? Authorizing Provider  aspirin 81 MG tablet Take 81 mg by mouth daily.     [provider]  atorvastatin (LIPITOR) 40 MG tablet TAKE ONE TABLET  BY MOUTH EVERY DAY 12/14/18   Iran Ouch, MD  Azelastine HCl 137 MCG/SPRAY SOLN Place 1 spray into both nostrils 2 (two) times daily.    [provider]  Cholecalciferol (VITAMIN D3) 1000 units CAPS Take 1,000 Units by mouth daily.     [provider]  denosumab (PROLIA) 60 MG/ML SOLN injection Inject 60 mg into the skin every 6 (six) months. Administer in upper arm, thigh, or abdomen    [provider]  donepezil (ARICEPT) 10 MG tablet Take 10 mg by mouth daily.     [provider]  ENSURE (ENSURE) Take 237 mLs by mouth 2 (two) times  daily between meals.    [provider]  ferrous sulfate 325 (65 FE) MG tablet Take 325 mg by mouth daily with breakfast.    [provider]  fluticasone (FLONASE) 50 MCG/ACT nasal spray Place 1 spray into both nostrils daily as needed for allergies or rhinitis (congestion).    [provider]  levothyroxine (SYNTHROID, LEVOTHROID) 75 MCG tablet Take 75 mcg by mouth daily before breakfast.  01/08/16   [provider]  Multiple Vitamin (MULTIVITAMIN WITH MINERALS) TABS tablet Take 1 tablet by mouth daily. 09/06/19   Enedina Finner, MD  mupirocin ointment (BACTROBAN) 2 % Apply 1 application topically 2 (two) times daily. (apply to skin tears)     [provider]  sertraline (ZOLOFT) 25 MG tablet Take 25 mg by mouth daily.  08/11/20   [provider]  traZODone (DESYREL) 50 MG tablet Take 50 mg by mouth at bedtime.    [provider]    Allergies Azithromycin, Hydrocodone-acetaminophen, Mobic [meloxicam], Ondansetron hcl, Penicillins, Percocet [oxycodone-acetaminophen], Reclast [zoledronic acid], Vicodin [hydrocodone-acetaminophen], Codeine, Ibandronic acid, Oxycodone, and Zofran [ondansetron]  Family History  Problem Relation Age of Onset  . Heart disease Mother   . Hyperlipidemia Mother   . Tuberculosis Mother   . Colon cancer Father   . Colon cancer Other   . Lymphoma Other   . Heart disease Other   . Breast cancer Neg Hx     Social History Social History   Tobacco Use  . Smoking status: Former Smoker    Packs/day: 0.10    Years: 2.00    Pack years: 0.20    Types: Cigarettes    Quit date: 10/04/1991    Years since quitting: 29.3  . Smokeless tobacco: Never Used  . Tobacco comment: smoked on and off  Substance Use Topics  . Alcohol use: No  . Drug use: No    Review of Systems Level 5 caveat: Unable to obtain review of systems due to dementia/nonverbal  status    ____________________________________________   PHYSICAL EXAM:  VITAL SIGNS: ED Triage Vitals  Enc Vitals Group     BP      Pulse      Resp      Temp      Temp src      SpO2      Weight      Height      Head Circumference      Peak Flow      Pain Score      Pain Loc      Pain Edu?      Excl. in GC?     Constitutional: Alert, nonverbal.  Comfortable appearing, in no acute distress. Eyes: Conjunctivae are normal.  EOMI.  PERRLA. Head: Atraumatic. Nose: No congestion/rhinnorhea. Mouth/Throat: Mucous membranes are moist.   Neck:  Normal range of motion.  Cardiovascular: Normal rate, regular rhythm. Grossly normal heart sounds.  Good peripheral circulation. Respiratory: Normal respiratory effort.  No retractions. Lungs CTAB. Gastrointestinal: Soft and nontender. No distention.  Genitourinary: No flank tenderness. Musculoskeletal: No lower extremity edema.  Extremities warm and well perfused.  Neurologic: Nonverbal.  Motor intact in all extremities. Skin:  Skin is warm and dry. No rash noted. Psychiatric: Unable to assess.  ____________________________________________   LABS (all labs ordered are listed, but only abnormal results are displayed)  Labs Reviewed  BASIC METABOLIC PANEL - Abnormal; Notable for the following components:      Result Value   Sodium 125 (*)    Chloride 87 (*)    Glucose, Bld 130 (*)    Calcium 8.4 (*)    All other components within normal limits  CBC WITH DIFFERENTIAL/PLATELET - Abnormal; Notable for the following components:   RBC 3.74 (*)    Hemoglobin 11.6 (*)    HCT 34.0 (*)    All other components within normal limits  BASIC METABOLIC PANEL - Abnormal; Notable for the following components:   Sodium 127 (*)    Chloride 93 (*)    Glucose, Bld 116 (*)    Calcium 8.1 (*)    All other components within normal limits  URINALYSIS, COMPLETE (UACMP) WITH MICROSCOPIC - Abnormal; Notable for the following components:   Color,  Urine YELLOW (*)    APPearance HAZY (*)    Ketones, ur 5 (*)    All other components within normal limits  OSMOLALITY - Abnormal; Notable for the following components:   Osmolality 262 (*)    All other components within normal limits  CBC - Abnormal; Notable for the following components:   RBC 3.50 (*)    Hemoglobin 10.7 (*)    HCT 31.9 (*)    All other components within normal limits  SARS CORONAVIRUS 2 (TAT 6-24 HRS)  BRAIN NATRIURETIC PEPTIDE  OSMOLALITY, URINE  SODIUM, URINE, RANDOM  PROCALCITONIN  BASIC METABOLIC PANEL  CBC  TROPONIN I (HIGH SENSITIVITY)   ____________________________________________  EKG  ED ECG REPORT I, Dionne BucySebastian Taran Haynesworth, the attending physician, personally viewed and interpreted this ECG.  Date: 01/23/2021 EKG Time: 1735 Rate: 64 Rhythm: normal sinus rhythm QRS Axis: normal Intervals: RBBB ST/T Wave abnormalities: normal Narrative Interpretation: no evidence of acute ischemia; no significant change when compared to EKG of 12/16/2020  ____________________________________________  RADIOLOGY  Chest x-ray interpreted by me shows left lung opacity, atelectasis versus infiltrate  ____________________________________________   PROCEDURES  Procedure(s) performed: No  Procedures  Critical Care performed: No ____________________________________________   INITIAL IMPRESSION / ASSESSMENT AND PLAN / ED COURSE  Pertinent labs & imaging results that were available during my care of the patient were reviewed by me and considered in my medical decision making (see chart for details).  80 year old female with PMH as noted above including CAD, hypertension, and currently nonverbal (either due to dementia or TBI?)  presents with apparent shortness of breath that was reported by facility staff to EMS.  However, the patient did not demonstrate any respiratory distress while en route.  She is unable to give any history.  I reviewed the past medical  records in Epic; the patient was most recently seen in the ED on 3/16 after an unwitnessed fall with a negative work-up, and previously in January 2020 also with an unwitnessed fall.  On exam currently, the patient is alert.  She is comfortable appearing.  Her vital signs are  normal.  She appears somewhat frail but physical exam is otherwise unremarkable for acute findings.  Lungs are clear and she does not demonstrate any increased work of breathing or respiratory distress.  We will obtain a chest x-ray and labs to work-up with a complaint of possible shortness of breath.  However if the patient remains asymptomatic and the work-up is negative I anticipate discharge back to the facility.  ----------------------------------------- 12:30 AM on 01/24/2021 -----------------------------------------  Lab work-up revealed hyponatremia.  Overall the patient appears more likely hypovolemic and slightly dehydrated rather than hypervolemic.  After a liter of normal saline the sodium did improve somewhat.  I was able to obtain further history from the patient's daughter on the phone who states that the nursing home told her that the patient had actually syncopized.  Therefore, we will plan for admission for further observation.  I consulted Dr. Para March from the hospitalist service.  Chest x-ray did note a possible infiltrate, however the patient clinically has no evidence of pneumonia.  She is afebrile with no leukocytosis, cough, or other respiratory symptoms.   ____________________________________________   FINAL CLINICAL IMPRESSION(S) / ED DIAGNOSES  Final diagnoses:  Hyponatremia  Syncope, unspecified syncope type      NEW MEDICATIONS STARTED DURING THIS VISIT:  Current Discharge Medication List       Note:  This document was prepared using Dragon voice recognition software and may include unintentional dictation errors.   Dionne Bucy, MD 01/24/21 [phone]

## 2021-01-23 NOTE — ED Notes (Signed)
Pt unable to sign MSE at this time. EDP was at bedside when pt arrived.

## 2021-01-23 NOTE — ED Notes (Signed)
Daughter Monica Downs visited pt and was updated on POC.

## 2021-01-23 NOTE — ED Notes (Signed)
Pt has returned from CT dept via stretcher-awake and alert; nonverbal.. cardiac monitoring and pulse ox monitoring resumed

## 2021-01-23 NOTE — ED Notes (Signed)
Pt returned from CT °

## 2021-01-23 NOTE — H&P (Signed)
History and Physical    Monica Downs MGQ:[id number] DOB: [birth year] DOA: 01/23/2021  PCP: Wilford Corner, PA-C   Patient coming from: [facility]  I have personally briefly reviewed patient's old medical records in [facility]  Chief Complaint: Altered mental status, shortness of breath  HPI: Monica Downs is a 80 y.o. female with medical history significant for Hypothyroidism, TIA, traumatic subarachnoid hemorrhage, CAD with LAD DES, advanced dementia, bedbound, mostly nonverbal, incontinent and total care dependent, but mostly alert at baseline and with good appetite albeit on pured diet due to dysphagia related to dementia, who was brought in for evaluation of a brief episode of altered mental status associated with shortness of breath.  Most of the history is taken from the daughter over the phone due to patient's dementia.  She states that patient was at baseline until about 2 weeks ago when she noticed that she was very lethargic and sleeping a lot which is not usual for her.  She requested that she receive physical therapy which was initiated yesterday.  Today as she was sitting on the side of the bed she slumped and became short of breath but quickly came around.  She did not fall.  According to her daughter, of late her oral intake was very diminished and typically she has a robust appetite in spite of her advanced dementia.  She states that she chokes on her food quite a bit and this would interrupt the administration of her food by the staff.  States she had an episode of choking on the day prior to coming in. ED course: On arrival, patient was alert, afebrile, BP 102/74, pulse 63 with O2 sat 96% on room air.  Blood work significant for sodium of 125.  Otherwise unremarkable.  Troponin normal at 4, BNP 16, WBC 7100.  Creatinine 0.46. EKG, viewed and interpreted myself sinus rhythm at 64 with right bundle branch block and no acute ST-T wave changes Imaging: Small  focal opacity within the left midlung may represent atelectasis or pneumonia.  Radiographic follow-up after appropriate treatment is recommended to document resolution  Patient was given a normal saline bolus in the emergency room.  Hospitalist consulted for admission.  Review of Systems: Unable to obtain due to dementia   Past Medical History:  Diagnosis Date  . Allergic rhinitis   . Anxiety   . CAD (coronary artery disease)    a. 07/2007 Cath/PCI: LM nl, LAD 99p (3.0 x 18 Xience EES), D1 90ost, LCX nl, RCA nl;  b. 09/2014 MV: EF 72%, attenuation artifact, no ischemia.  . Clotting disorder (HCC)    LEGS;throat  . Falls   . History of subarachnoid hemorrhage    a. 04/2016 In setting of fall-->traumatic brain injury.  Marland Kitchen HLD (hyperlipidemia)   . Hypertension   . Hypothyroidism   . Incontinence   . Left Forearm AV Fistula    a. Asymptomatic.  No blood draws/IV's;  b. 06/2013 confirmed by L FA duplex.  . Mitral regurgitation    a. 02/2011 Echo: EF 72%, trace TR, mild MR/AI; b. 01/2018 Echo: EF 60-65%, no rwma, Gr2 DD, mild AI/MR. Nl RV fxn. Mildly dil RV/RA. Mod TR. Nl PASP.  Marland Kitchen Obstructive sleep apnea   . Panic attacks   . Traumatic brain injury (HCC)    a. 04/2016 in setting of fall/SAH.    Past Surgical History:  Procedure Laterality Date  . BUNIONETTE EXCISION    . CARDIAC CATHETERIZATION  07/2007  99% mid LAD stenosis  . CORONARY STENT PLACEMENT  07/2007   Mid LAD: 3.0 X 18 mm Xience stent  . MULTIPLE TOOTH EXTRACTIONS    . THYROIDECTOMY    . TOTAL KNEE ARTHROPLASTY     left     reports that she quit smoking about 29 years ago. Her smoking use included cigarettes. She has a 0.20 pack-year smoking history. She has never used smokeless tobacco. She reports that she does not drink alcohol and does not use drugs.  Allergies  Allergen Reactions  . Azithromycin Anaphylaxis  . Hydrocodone-Acetaminophen Anaphylaxis  . Mobic [Meloxicam] Swelling  . Ondansetron Hcl Other (See  Comments) and Swelling    BLOOD CLOT;caused cardiac arrest BLOOD CLOT;caused cardiac arrest  . Penicillins Shortness Of Breath    Has patient had a PCN reaction causing immediate rash, facial/tongue/throat swelling, SOB or lightheadedness with hypotension: Yes Has patient had a PCN reaction causing severe rash involving mucus membranes or skin necrosis: No Has patient had a PCN reaction that required hospitalization: No Has patient had a PCN reaction occurring within the last 10 years: No If all of the above answers are "NO", then may proceed with Cephalosporin use.   Marland Kitchen Percocet [Oxycodone-Acetaminophen] Anaphylaxis  . Reclast [Zoledronic Acid] Swelling  . Vicodin [Hydrocodone-Acetaminophen] Anaphylaxis  . Codeine Other (See Comments)    Went into cardiac arrest at [facility] with this after surgery  . Ibandronic Acid   . Oxycodone Swelling    Swelling of tongue  . Zofran [Ondansetron]     BLOOD CLOT;caused cardiac arrest    Family History  Problem Relation Age of Onset  . Heart disease Mother   . Hyperlipidemia Mother   . Tuberculosis Mother   . Colon cancer Father   . Colon cancer Other   . Lymphoma Other   . Heart disease Other   . Breast cancer Neg Hx       Prior to Admission medications   Medication Sig Start Date End Date Taking? Authorizing Provider  aspirin 81 MG tablet Take 81 mg by mouth daily.     [provider]  atorvastatin (LIPITOR) 40 MG tablet TAKE ONE TABLET BY MOUTH EVERY DAY 12/14/18   Iran Ouch, MD  Azelastine HCl 137 MCG/SPRAY SOLN Place 1 spray into both nostrils 2 (two) times daily.    [provider]  Cholecalciferol (VITAMIN D3) 1000 units CAPS Take 1,000 Units by mouth daily.     [provider]  denosumab (PROLIA) 60 MG/ML SOLN injection Inject 60 mg into the skin every 6 (six) months. Administer in upper arm, thigh, or abdomen    [provider]  donepezil (ARICEPT) 10 MG tablet Take 10 mg by mouth daily.      [provider]  ENSURE (ENSURE) Take 237 mLs by mouth 2 (two) times daily between meals.    [provider]  ferrous sulfate 325 (65 FE) MG tablet Take 325 mg by mouth daily with breakfast.    [provider]  fluticasone (FLONASE) 50 MCG/ACT nasal spray Place 1 spray into both nostrils daily as needed for allergies or rhinitis (congestion).    [provider]  levothyroxine (SYNTHROID, LEVOTHROID) 75 MCG tablet Take 75 mcg by mouth daily before breakfast.  01/08/16   [provider]  Multiple Vitamin (MULTIVITAMIN WITH MINERALS) TABS tablet Take 1 tablet by mouth daily. 09/06/19   Enedina Finner, MD  mupirocin ointment (BACTROBAN) 2 % Apply 1 application topically 2 (two) times daily. (  apply to skin tears)     [provider]  sertraline (ZOLOFT) 25 MG tablet Take 25 mg by mouth daily.  08/11/20   [provider]  traZODone (DESYREL) 50 MG tablet Take 50 mg by mouth at bedtime.    [provider]    Physical Exam: Vitals:   01/23/21 1915 01/23/21 2000 01/23/21 2030 01/23/21 2100  BP:  127/78 100/67 108/78  Pulse: 68     Resp: 15 14 13    Temp:      TempSrc:      SpO2: 98%     Weight:      Height:         Vitals:   01/23/21 1915 01/23/21 2000 01/23/21 2030 01/23/21 2100  BP:  127/78 100/67 108/78  Pulse: 68     Resp: 15 14 13    Temp:      TempSrc:      SpO2: 98%     Weight:      Height:          Constitutional: Frail-appearing, nonverbal. Not in any apparent distress HEENT:      Head: Normocephalic and atraumatic.         Eyes: PERLA, EOMI, Conjunctivae are normal. Sclera is non-icteric.       Mouth/Throat: Mucous membranes are moist.       Neck: Supple with no signs of meningismus. Cardiovascular: Regular rate and rhythm. No murmurs, gallops, or rubs. 2+ symmetrical distal pulses are present . No JVD. No LE edema Respiratory: Respiratory effort normal .Lungs sounds clear bilaterally. No wheezes, crackles,  or rhonchi.  Gastrointestinal: Soft, non tender, and non distended with positive bowel sounds.  Genitourinary: No CVA tenderness. Musculoskeletal:  Lower extremity contractures no cyanosis, or erythema of extremities. Neurologic:  Face is symmetric. Moving all extremities. No gross focal neurologic deficits . Skin: Skin is warm, dry.  Rash below left breast Psychiatric: Flat affect, though unable to adequately assess due to dementia   Labs on Admission: I have personally reviewed following labs and imaging studies  CBC: Recent Labs  Lab 01/23/21 1722  WBC 7.1  NEUTROABS 3.8  HGB 11.6*  HCT 34.0*  MCV 90.9  PLT 253   Basic Metabolic Panel: Recent Labs  Lab 01/23/21 1722 01/23/21 1924  NA 125* 127*  K 3.6 3.9  CL 87* 93*  CO2 27 25  GLUCOSE 130* 116*  BUN 13 11  CREATININE 0.48 0.46  CALCIUM 8.4* 8.1*   GFR: Estimated Creatinine Clearance: 37.5 mL/min (by C-G formula based on SCr of 0.46 mg/dL). Liver Function Tests: No results for input(s): AST, ALT, ALKPHOS, BILITOT, PROT, ALBUMIN in the last 168 hours. No results for input(s): LIPASE, AMYLASE in the last 168 hours. No results for input(s): AMMONIA in the last 168 hours. Coagulation Profile: No results for input(s): INR, PROTIME in the last 168 hours. Cardiac Enzymes: No results for input(s): CKTOTAL, CKMB, CKMBINDEX, TROPONINI in the last 168 hours. BNP (last 3 results) No results for input(s): PROBNP in the last 8760 hours. HbA1C: No results for input(s): HGBA1C in the last 72 hours. CBG: No results for input(s): GLUCAP in the last 168 hours. Lipid Profile: No results for input(s): CHOL, HDL, LDLCALC, TRIG, CHOLHDL, LDLDIRECT in the last 72 hours. Thyroid Function Tests: No results for input(s): TSH, T4TOTAL, FREET4, T3FREE, THYROIDAB in the last 72 hours. Anemia Panel: No results for input(s): VITAMINB12, FOLATE, FERRITIN, TIBC, IRON, RETICCTPCT in the last 72 hours. Urine analysis:  Component Value  Date/Time   COLORURINE AMBER (A) 09/03/2019 1641   APPEARANCEUR CLOUDY (A) 09/03/2019 1641   APPEARANCEUR Clear 09/30/2016 0934   LABSPEC 1.026 09/03/2019 1641   PHURINE 5.0 09/03/2019 1641   GLUCOSEU NEGATIVE 09/03/2019 1641   HGBUR NEGATIVE 09/03/2019 1641   BILIRUBINUR NEGATIVE 09/03/2019 1641   BILIRUBINUR Negative 09/30/2016 0934   KETONESUR NEGATIVE 09/03/2019 1641   PROTEINUR NEGATIVE 09/03/2019 1641   NITRITE POSITIVE (A) 09/03/2019 1641   LEUKOCYTESUR SMALL (A) 09/03/2019 1641    Radiological Exams on Admission: DG Chest Portable 1 View  Result Date: 01/23/2021 CLINICAL DATA:  Shortness of breath EXAM: PORTABLE CHEST 1 VIEW COMPARISON:  09/22/2019 FINDINGS: Stable cardiomediastinal contours. Atherosclerotic calcification of the aortic knob. Small focal opacity within the left mid lung. Right lung is clear. No pleural effusion or pneumothorax. IMPRESSION: Small focal opacity within the left mid lung may represent atelectasis or pneumonia. Radiographic follow-up after appropriate treatment is recommended to document resolution. Electronically Signed   By: Duanne Guess D.O.   On: 01/23/2021 17:52     Assessment/Plan 80 year old female with history of hypothyroidism, TIA, traumatic subarachnoid hemorrhage, CAD with LAD DES, advanced dementia, bedbound, mostly nonverbal, incontinent and total care dependent,  brought in for evaluation of a brief episode of altered mental status associated with shortness of breath.     AMS/presyncope - Patient with brief presyncopal episode while sitting at the side of the bed for physical therapy - Suspect related to dehydration, orthostasis in view of borderline BP on arrival of 102/74, and history of decreased oral intake x2 weeks - IV hydration and monitor orthostatics    Dyspnea, ?  Aspiration    Possible pneumonia on chest x-ray - Patient with a brief episode of dyspnea reported by nursing home, in the setting of history of dysphagia  related to dementia, choking episode the day prior reported by daughter - Clinically, vitals are within normal limits, no leukocytosis - Chest x-ray:Small focal opacity within the left midlung may represent atelectasis or pneumonia.  Radiographic follow-up after appropriate treatment is recommended to document resolution - Aspiration precautions - SLP consult - Procalcitonin to evaluate for likelihood of LR TI    Hyponatremia, likely hypovolemic - IV hydration with normal saline - Monitor  Advanced dementia(dysphagia/nonverbal/bedbound) Total care dependent/functional quadriplegia - Increase nursing care - Fall and aspiration precautions - Pured diet and SLP consult    CAD (coronary artery disease) - No reports of chest pain and EKG is nonacute - Continue home meds    Hypothyroidism, unspecified - Continue levothyroxine    History of TIA (transient ischemic attack)   History of subarachnoid hemorrhage - CVA not suspected at follow-up CT head    Underweight -Nutritionist consult  DVT prophylaxis: Lovenox  Code Status: DNR  Family Communication: Extensive conversation with daughter over the phone about plan of care.  She voiced understanding and agreement.  Discussed CODE STATUS.  Patient is DNR Disposition Plan: Back to previous home environment Consults called: none  Status: Observation    Andris Baumann MD [facility]     01/23/2021, 9:54 PM

## 2021-01-23 NOTE — ED Triage Notes (Signed)
Pt arrives to ER via [facility] from [facility] for "difficulty breathing" EMS reports pt is nonlabored, RA 95%, not tachypnic, lung sounds clear. Pt is non verbal at baseline, will grunt and track her eyes. Pt follows commands upon arrival. VSS, CBG was WNL. Pt arrives saturated in urine. Brief removed. Pt cleaned with wipes and dry brief placed on pt. Pt gown saturated as well. Changed into hospital gown.

## 2021-01-24 ENCOUNTER — Other Ambulatory Visit: Payer: Self-pay

## 2021-01-24 DIAGNOSIS — Z8673 Personal history of transient ischemic attack (TIA), and cerebral infarction without residual deficits: Secondary | ICD-10-CM | POA: Diagnosis not present

## 2021-01-24 DIAGNOSIS — Z880 Allergy status to penicillin: Secondary | ICD-10-CM | POA: Diagnosis not present

## 2021-01-24 DIAGNOSIS — I2510 Atherosclerotic heart disease of native coronary artery without angina pectoris: Secondary | ICD-10-CM | POA: Diagnosis present

## 2021-01-24 DIAGNOSIS — Z66 Do not resuscitate: Secondary | ICD-10-CM | POA: Diagnosis present

## 2021-01-24 DIAGNOSIS — Z7401 Bed confinement status: Secondary | ICD-10-CM | POA: Diagnosis not present

## 2021-01-24 DIAGNOSIS — F0390 Unspecified dementia without behavioral disturbance: Secondary | ICD-10-CM | POA: Diagnosis present

## 2021-01-24 DIAGNOSIS — I340 Nonrheumatic mitral (valve) insufficiency: Secondary | ICD-10-CM | POA: Diagnosis present

## 2021-01-24 DIAGNOSIS — R532 Functional quadriplegia: Secondary | ICD-10-CM | POA: Diagnosis present

## 2021-01-24 DIAGNOSIS — Z96652 Presence of left artificial knee joint: Secondary | ICD-10-CM | POA: Diagnosis present

## 2021-01-24 DIAGNOSIS — Z881 Allergy status to other antibiotic agents status: Secondary | ICD-10-CM | POA: Diagnosis not present

## 2021-01-24 DIAGNOSIS — Z886 Allergy status to analgesic agent status: Secondary | ICD-10-CM | POA: Diagnosis not present

## 2021-01-24 DIAGNOSIS — E890 Postprocedural hypothyroidism: Secondary | ICD-10-CM | POA: Diagnosis present

## 2021-01-24 DIAGNOSIS — Z955 Presence of coronary angioplasty implant and graft: Secondary | ICD-10-CM | POA: Diagnosis not present

## 2021-01-24 DIAGNOSIS — J189 Pneumonia, unspecified organism: Secondary | ICD-10-CM | POA: Diagnosis present

## 2021-01-24 DIAGNOSIS — Z20822 Contact with and (suspected) exposure to covid-19: Secondary | ICD-10-CM | POA: Diagnosis present

## 2021-01-24 DIAGNOSIS — E785 Hyperlipidemia, unspecified: Secondary | ICD-10-CM | POA: Diagnosis present

## 2021-01-24 DIAGNOSIS — E871 Hypo-osmolality and hyponatremia: Secondary | ICD-10-CM

## 2021-01-24 DIAGNOSIS — Z681 Body mass index (BMI) 19 or less, adult: Secondary | ICD-10-CM | POA: Diagnosis not present

## 2021-01-24 DIAGNOSIS — G9341 Metabolic encephalopathy: Secondary | ICD-10-CM | POA: Diagnosis present

## 2021-01-24 DIAGNOSIS — G4733 Obstructive sleep apnea (adult) (pediatric): Secondary | ICD-10-CM | POA: Diagnosis present

## 2021-01-24 DIAGNOSIS — R1310 Dysphagia, unspecified: Secondary | ICD-10-CM | POA: Diagnosis present

## 2021-01-24 DIAGNOSIS — Z885 Allergy status to narcotic agent status: Secondary | ICD-10-CM | POA: Diagnosis not present

## 2021-01-24 DIAGNOSIS — E43 Unspecified severe protein-calorie malnutrition: Secondary | ICD-10-CM | POA: Diagnosis present

## 2021-01-24 DIAGNOSIS — I4510 Unspecified right bundle-branch block: Secondary | ICD-10-CM | POA: Diagnosis present

## 2021-01-24 LAB — BASIC METABOLIC PANEL
Anion gap: 8 (ref 5–15)
BUN: 10 mg/dL (ref 8–23)
CO2: 25 mmol/L (ref 22–32)
Calcium: 7.9 mg/dL — ABNORMAL LOW (ref 8.9–10.3)
Chloride: 95 mmol/L — ABNORMAL LOW (ref 98–111)
Creatinine, Ser: 0.47 mg/dL (ref 0.44–1.00)
GFR, Estimated: >60 mL/min (ref >60)
Glucose, Bld: 82 mg/dL (ref 70–99)
Potassium: 3.6 mmol/L (ref 3.5–5.1)
Sodium: 128 mmol/L — ABNORMAL LOW (ref 135–145)

## 2021-01-24 LAB — CBC
HCT: 31.2 % — ABNORMAL LOW (ref 36.0–46.0)
Hemoglobin: 11 g/dL — ABNORMAL LOW (ref 12.0–15.0)
MCH: 31.1 pg (ref 26.0–34.0)
MCHC: 35.3 g/dL (ref 30.0–36.0)
MCV: 88.1 fL (ref 80.0–100.0)
Platelets: 222 10*3/uL (ref 150–400)
RBC: 3.54 MIL/uL — ABNORMAL LOW (ref 3.87–5.11)
RDW: 13 % (ref 11.5–15.5)
WBC: 7.7 10*3/uL (ref 4.0–10.5)
nRBC: 0 % (ref 0.0–0.2)

## 2021-01-24 LAB — PROCALCITONIN: Procalcitonin: <0.1 ng/mL

## 2021-01-24 LAB — SARS CORONAVIRUS 2 (TAT 6-24 HRS): SARS Coronavirus 2: NEGATIVE

## 2021-01-24 MED ORDER — SODIUM CHLORIDE 0.9 % IV SOLN
100.0000 mg | Freq: Two times a day (BID) | INTRAVENOUS | Status: DC
  Administered 2021-01-24 – 2021-01-26 (×5): 100 mg via INTRAVENOUS
  Filled 2021-01-24 (×8): qty 100

## 2021-01-24 MED ORDER — LEVOTHYROXINE SODIUM 50 MCG PO TABS
75.0000 ug | ORAL_TABLET | Freq: Every day | ORAL | Status: DC
  Administered 2021-01-24 – 2021-01-26 (×3): 75 ug via ORAL
  Filled 2021-01-24 (×3): qty 1

## 2021-01-24 NOTE — Progress Notes (Signed)
Pt transferred via hospital bed from 2A Room 258 to 1C Room 118

## 2021-01-24 NOTE — Progress Notes (Signed)
PROGRESS NOTE    Monica Downs  CNO:[id number] DOB: 1941/03/19 DOA: 01/23/2021 PCP: Wilford Corner, PA-C   Assessment & Plan:   Principal Problem:   AMS (altered mental status) Active Problems:   CAD (coronary artery disease)   Hypothyroidism, unspecified   History of TIA (transient ischemic attack)   Advanced dementia (HCC)   Hyponatremia   History of subarachnoid hemorrhage   Dysphagia   Dyspnea   Underweight   Metabolic encephalopathy: etiology unclear. Hx of subarachnoid hemorrhage & dementia.    Presyncope: brief episode while sitting at the side of the bed for physical therapy. Possibly secondary to dehydration vs orthostatic. Continue on IVFs  Possible pneumonia: possibly secondary to aspiration. Allergic to penicillins. Will start IV doxycycline   Hyponatremia: continue on IVFs. Trending up today   Dementia & functional quadriplegia: advanced. Continue w/ supportive care. Would benefit from palliative care consult   Hx of CAD: will restart home dose of aspirin, statin   Hypothyroidism: continue on home dose of levothyroxine   Hx of subarachnoid hemorrhage: continue w/ supportive care.  Likely severe protein calorie malnutrition: BMI 15.8. Will consult nutrition tomorrow as not available on the weekends  DVT prophylaxis: lovenox  Code Status: full  Family Communication: discussed pt's care w/ pt's daughter and answered her questions Disposition Plan: unclear, likely back to home facility   Level of care: Progressive Cardiac   Status is: Observation  The patient remains OBS appropriate and will d/c before 2 midnights.  Dispo: The patient is from: Home facility               Anticipated d/c is to: Home facility               Patient currently is not medically stable to d/c.   Difficult to place patient No   Consultants:      Procedures:   Antimicrobials: doxycyline   Subjective: Pt is nonverbal   Objective: Vitals:   01/23/21  2030 01/23/21 2100 01/24/21 0007 01/24/21 0530  BP: 100/67 108/78 137/87 124/78  Pulse:   72 81  Resp: 13  17 18   Temp:   97.6 F (36.4 C) 97.8 F (36.6 C)  TempSrc:      SpO2:   100% 97%  Weight:      Height:        Intake/Output Summary (Last 24 hours) at 01/24/2021 0751 Last data filed at 01/24/2021 0300 Gross per 24 hour  Intake 1400 ml  Output 13 ml  Net 1387 ml   Filed Weights   01/23/21 1730  Weight: 43.1 kg    Examination:  General exam: Appears comfortable. Appears older than stated age  Respiratory system: diminished breath sounds b/l. Cardiovascular system: S1 & S2 +. No rubs, gallops or clicks. Gastrointestinal system: Abdomen is nondistended, soft and nontender.Normal bowel sounds heard. Central nervous system: Alert and awake Psychiatry: Judgement and insight appear abnormal.Flat mood and affect.     Data Reviewed: I have personally reviewed following labs and imaging studies  CBC: Recent Labs  Lab 01/23/21 1722 01/23/21 2221 01/24/21 0434  WBC 7.1 9.6 7.7  NEUTROABS 3.8  --   --   HGB 11.6* 10.7* 11.0*  HCT 34.0* 31.9* 31.2*  MCV 90.9 91.1 88.1  PLT 253 236 222   Basic Metabolic Panel: Recent Labs  Lab 01/23/21 1722 01/23/21 1924 01/24/21 0434  NA 125* 127* 128*  K 3.6 3.9 3.6  CL 87* 93* 95*  CO2 27 25  25  GLUCOSE 130* 116* 82  BUN 13 11 10   CREATININE 0.48 0.46 0.47  CALCIUM 8.4* 8.1* 7.9*   GFR: Estimated Creatinine Clearance: 37.5 mL/min (by C-G formula based on SCr of 0.47 mg/dL). Liver Function Tests: No results for input(s): AST, ALT, ALKPHOS, BILITOT, PROT, ALBUMIN in the last 168 hours. No results for input(s): LIPASE, AMYLASE in the last 168 hours. No results for input(s): AMMONIA in the last 168 hours. Coagulation Profile: No results for input(s): INR, PROTIME in the last 168 hours. Cardiac Enzymes: No results for input(s): CKTOTAL, CKMB, CKMBINDEX, TROPONINI in the last 168 hours. BNP (last 3 results) No results for  input(s): PROBNP in the last 8760 hours. HbA1C: No results for input(s): HGBA1C in the last 72 hours. CBG: No results for input(s): GLUCAP in the last 168 hours. Lipid Profile: No results for input(s): CHOL, HDL, LDLCALC, TRIG, CHOLHDL, LDLDIRECT in the last 72 hours. Thyroid Function Tests: No results for input(s): TSH, T4TOTAL, FREET4, T3FREE, THYROIDAB in the last 72 hours. Anemia Panel: No results for input(s): VITAMINB12, FOLATE, FERRITIN, TIBC, IRON, RETICCTPCT in the last 72 hours. Sepsis Labs: Recent Labs  Lab 01/23/21 2221  PROCALCITON <0.10    No results found for this or any previous visit (from the past 240 hour(s)).       Radiology Studies: CT HEAD WO CONTRAST  Result Date: 01/23/2021 CLINICAL DATA:  Mental status change. EXAM: CT HEAD WITHOUT CONTRAST TECHNIQUE: Contiguous axial images were obtained from the base of the skull through the vertex without intravenous contrast. COMPARISON:  Most recent head CT 12/16/2020 FINDINGS: Brain: Stable degree of atrophy. Moderate chronic small vessel ischemia. No intracranial hemorrhage, mass effect, or midline shift. No hydrocephalus. The basilar cisterns are patent. No evidence of territorial infarct or acute ischemia. No extra-axial or intracranial fluid collection. Vascular: Atherosclerosis of skullbase vasculature without hyperdense vessel or abnormal calcification. Skull: No fracture or focal lesion. Sinuses/Orbits: No acute finding. Subtotal opacification of left mastoid air cells is unchanged from prior exam. Other: None. IMPRESSION: 1. No acute intracranial abnormality. 2. Stable atrophy and chronic small vessel ischemia. Electronically Signed   By: 12/18/2020 M.D.   On: 01/23/2021 22:37   DG Chest Portable 1 View  Result Date: 01/23/2021 CLINICAL DATA:  Shortness of breath EXAM: PORTABLE CHEST 1 VIEW COMPARISON:  09/22/2019 FINDINGS: Stable cardiomediastinal contours. Atherosclerotic calcification of the aortic knob.  Small focal opacity within the left mid lung. Right lung is clear. No pleural effusion or pneumothorax. IMPRESSION: Small focal opacity within the left mid lung may represent atelectasis or pneumonia. Radiographic follow-up after appropriate treatment is recommended to document resolution. Electronically Signed   By: 09/24/2019 D.O.   On: 01/23/2021 17:52        Scheduled Meds: . enoxaparin (LOVENOX) injection  30 mg Subcutaneous Q24H   Continuous Infusions:   LOS: 0 days    Time spent: 33 mins    01/25/2021, MD [facility] Pager [phone]  If 7PM-7AM, please contact night-coverage 01/24/2021, 7:51 AM

## 2021-01-24 NOTE — TOC Progression Note (Signed)
Transition of Care [facility]) - Progression Note    Patient Details  Name: Monica Downs MRN: [id number] Date of Birth: [birth year]  [facility]) CM/SW Contact  Bing Quarry, RN Phone Number: 01/24/2021, 9:30 AM  Clinical Narrative:   4/24 : OBS status: Admit ED 520 pm 4/23. From [facility]. Baseline is non-verbal, incontinent, follows commands, dysphagia, bedbound, htx of traumatic SAH. (Daughter) Joni Reining [phone] [facility]). PoC pending. Gabriel Cirri RN CM          Expected Discharge Plan and Services                                                 Social Determinants of Health (SDOH) Interventions    Readmission Risk Interventions No flowsheet data found.

## 2021-01-24 NOTE — Progress Notes (Signed)
Patient's daughter [name] at bedside and updated on room/unit change.

## 2021-01-25 DIAGNOSIS — E43 Unspecified severe protein-calorie malnutrition: Secondary | ICD-10-CM | POA: Insufficient documentation

## 2021-01-25 LAB — CBC
HCT: 33 % — ABNORMAL LOW (ref 36.0–46.0)
Hemoglobin: 11.3 g/dL — ABNORMAL LOW (ref 12.0–15.0)
MCH: 30.9 pg (ref 26.0–34.0)
MCHC: 34.2 g/dL (ref 30.0–36.0)
MCV: 90.2 fL (ref 80.0–100.0)
Platelets: 238 10*3/uL (ref 150–400)
RBC: 3.66 MIL/uL — ABNORMAL LOW (ref 3.87–5.11)
RDW: 13.2 % (ref 11.5–15.5)
WBC: 7.3 10*3/uL (ref 4.0–10.5)
nRBC: 0 % (ref 0.0–0.2)

## 2021-01-25 LAB — BASIC METABOLIC PANEL
Anion gap: 8 (ref 5–15)
BUN: 14 mg/dL (ref 8–23)
CO2: 27 mmol/L (ref 22–32)
Calcium: 7.9 mg/dL — ABNORMAL LOW (ref 8.9–10.3)
Chloride: 94 mmol/L — ABNORMAL LOW (ref 98–111)
Creatinine, Ser: 0.43 mg/dL — ABNORMAL LOW (ref 0.44–1.00)
GFR, Estimated: >60 mL/min (ref >60)
Glucose, Bld: 75 mg/dL (ref 70–99)
Potassium: 3.5 mmol/L (ref 3.5–5.1)
Sodium: 129 mmol/L — ABNORMAL LOW (ref 135–145)

## 2021-01-25 MED ORDER — ASPIRIN EC 81 MG PO TBEC
81.0000 mg | DELAYED_RELEASE_TABLET | Freq: Every day | ORAL | Status: DC
  Administered 2021-01-25 – 2021-01-26 (×2): 81 mg via ORAL
  Filled 2021-01-25 (×2): qty 1

## 2021-01-25 MED ORDER — DONEPEZIL HCL 5 MG PO TABS
10.0000 mg | ORAL_TABLET | Freq: Every day | ORAL | Status: DC
  Administered 2021-01-25: 21:00:00 10 mg via ORAL
  Filled 2021-01-25: qty 2

## 2021-01-25 MED ORDER — ENSURE ENLIVE PO LIQD
237.0000 mL | Freq: Three times a day (TID) | ORAL | Status: DC
  Administered 2021-01-25 – 2021-01-26 (×2): 237 mL via ORAL

## 2021-01-25 MED ORDER — ATORVASTATIN CALCIUM 20 MG PO TABS
40.0000 mg | ORAL_TABLET | Freq: Every day | ORAL | Status: DC
  Administered 2021-01-25 – 2021-01-26 (×2): 40 mg via ORAL
  Filled 2021-01-25 (×2): qty 2

## 2021-01-25 NOTE — Progress Notes (Signed)
Initial Nutrition Assessment  DOCUMENTATION CODES:  Underweight,Severe malnutrition in context of chronic illness  INTERVENTION:   Continue current diet as ordered, nursing to assist with feeding all meals  Ensure Enlive po TID, each supplement provides 350 kcal and 20 grams of protein  NUTRITION DIAGNOSIS:  Severe Malnutrition related to chronic illness (dementia) as evidenced by severe fat depletion,severe muscle depletion.  GOAL:  Patient will meet greater than or equal to 90% of their needs  MONITOR:  PO intake,Weight trends  REASON FOR ASSESSMENT:  Consult Assessment of nutrition requirement/status  ASSESSMENT:  Pt presented to ED with AMS and SOB from nursing facility. At baseline, pt is on a puree diet due to dementia related dysphagia, mostly nonverbal, requires total care, and bed bound. PMH includes CAD, HLD, HTN, advanced dementia, TIA, hypothyroidism.  Pt resting in bed at the time of visit. No family at bedside to provide a nutrition hx. Pt has visible muscle and fat deficits. Intake so far this admission is poor with the exception of a meal that nursing staff noted daughter fed to pt. Lunch tray noted to be at bedside untouched at the time of visit. Pt would benefit from 1:1 feeding at all meals. Will also add nutrition supplements to augment intake. Significant difference in recorded weight from last month. Will request new measured to verify accuracy  Average Meal Intake: . 4/23-4/25: 21% intake x 4 recorded meals  Nutritionally Relevant Scheduled Meds: . atorvastatin  40 mg Oral Daily   Nutritionally Relevant Continuous Infusions: . doxycycline (VIBRAMYCIN) IV 100 mg (01/25/21 0935)   Nutritionally Relevant PRN Meds: promethazine  Labs reviewed:  Na 129  SBG ranges from 75-82 mg/dL over the last 24 hours  NUTRITION - FOCUSED PHYSICAL EXAM: Flowsheet Row Most Recent Value  Orbital Region Severe depletion  Upper Arm Region Severe depletion  Thoracic  and Lumbar Region Severe depletion  Buccal Region Severe depletion  Temple Region Severe depletion  Clavicle Bone Region Severe depletion  Clavicle and Acromion Bone Region Severe depletion  Scapular Bone Region Severe depletion  Dorsal Hand Severe depletion  Patellar Region Severe depletion  Anterior Thigh Region Severe depletion  Posterior Calf Region Severe depletion  Edema (RD Assessment) None  Hair Reviewed  Eyes Reviewed  Mouth Unable to assess  Skin Reviewed  [bruising to the legs]  Nails Reviewed     Diet Order:   Diet Order            DIET - DYS 1 Room service appropriate? Yes; Fluid consistency: Thin  Diet effective now                EDUCATION NEEDS:  No education needs have been identified at this time  Skin:  Skin Assessment: Reviewed RN Assessment  Last BM:  4/24 per RN documentation, type 5  Height:  Ht Readings from Last 1 Encounters:  01/23/21 5\' 5"  (1.651 m)    Weight:  Wt Readings from Last 1 Encounters:  01/23/21 43.1 kg    Ideal Body Weight:  56.8 kg  BMI:  Body mass index is 15.81 kg/m.  Estimated Nutritional Needs:   Kcal:  1500-1700 kcal/d  Protein:  75-85 g  Fluid:  > 1500 mL/d   01/25/21, RD, LDN Clinical Dietitian Pager on Amion

## 2021-01-25 NOTE — [place] FL2 (Signed)
[place] MEDICAID FL2 LEVEL OF CARE SCREENING TOOL     IDENTIFICATION  Patient Name: Monica Downs Birthdate: 19-Aug-1941 Sex: female Admission Date (Current Location): 01/23/2021  [place] and [place] Number:  Chiropodist and Address:  [facility], [address] [place], [place] [address]      Provider Number: [id number]  Attending Physician Name and Address:  Charise Killian, MD  Relative Name and Phone Number:  Waneta Martins (daughter) [phone]    Current Level of Care: Hospital Recommended Level of Care: Skilled Nursing Facility Prior Approval Number:    Date Approved/Denied:   PASRR Number:    Discharge Plan: SNF    Current Diagnoses: Patient Active Problem List   Diagnosis Date Noted  . Pneumonia 01/24/2021  . TBI (traumatic brain injury) (HCC) 01/23/2021  . Hyponatremia 01/23/2021  . History of subarachnoid hemorrhage 01/23/2021  . Dysphagia 01/23/2021  . Dyspnea 01/23/2021  . Underweight 01/23/2021  . Altered mental status 09/04/2019  . Acute cystitis with hematuria   . Nondisplaced posterior arch fracture of first cervical vertebra with routine healing   . COVID-19   . Fall   . Hypokalemia 09/03/2019  . Acute lower UTI 09/03/2019  . AMS (altered mental status) 09/03/2019  . Elevated troponin 09/03/2019  . Unspecified dementia with behavioral disturbance (HCC) 01/02/2019  . Advanced dementia (HCC) 12/30/2018  . Essential hypertension 01/26/2018  . History of TIA (transient ischemic attack) 01/26/2018  . Traumatic subarachnoid hemorrhage (HCC) 04/11/2016  . Hypothyroidism, unspecified 01/15/2016  . 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 Atherosclerotic heart disease of native coronary artery without angina pectoris 02/06/2013  . 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 CAD (coronary artery disease)   . HLD (hyperlipidemia)     Orientation RESPIRATION BLADDER Height & Weight     Self  Normal Incontinent  Weight: 45 kg Height:  5\' 5"  (165.1 cm)  BEHAVIORAL SYMPTOMS/MOOD NEUROLOGICAL BOWEL NUTRITION STATUS      Incontinent Diet (Dysphagia Diet 1)  AMBULATORY STATUS COMMUNICATION OF NEEDS Skin   Total Care Non-Verbally Normal                       Personal Care Assistance Level of Assistance  Total care       Total Care Assistance: Maximum assistance   Functional Limitations Info             SPECIAL CARE FACTORS FREQUENCY                       Contractures Contractures Info: Not present    Additional Factors Info  Code Status,Allergies Code Status Info: DNR Allergies Info: Azithromycin, hydrocodone-acetaminophen, mobic, ondansetron, PCN, percocet, reclast, vicodin, codeine, ibandronic acid, oxycodone, zofran           Current Medications (01/25/2021):  This is the current hospital active medication list Current Facility-Administered Medications  Medication Dose Route Frequency Provider Last Rate Last Admin  . aspirin EC tablet 81 mg  81 mg Oral Daily 01/27/2021, MD   81 mg at 01/25/21 1201  . atorvastatin (LIPITOR) tablet 40 mg  40 mg Oral Daily 01/27/21, MD   40 mg at 01/25/21 1201  . donepezil (ARICEPT) tablet 10 mg  10 mg Oral QHS 01/27/21, MD      . doxycycline (VIBRAMYCIN) 100 mg in sodium chloride 0.9 % 250 mL IVPB  100 mg Intravenous Q12H Charise Killian M,  MD   Stopped at 01/25/21 1135  . enoxaparin (LOVENOX) injection 30 mg  30 mg Subcutaneous Q24H Andris Baumann, MD   30 mg at 01/24/21 2053  . feeding supplement (ENSURE ENLIVE / ENSURE PLUS) liquid 237 mL  237 mL Oral TID BM Charise Killian, MD      . levothyroxine (SYNTHROID) tablet 75 mcg  75 mcg Oral Q0600 Charise Killian, MD   75 mcg at 01/24/21 1009  . promethazine (PHENERGAN) suppository 12.5 mg  12.5 mg Rectal Q6H PRN Andris Baumann, MD         Discharge Medications: Please see discharge summary for a list of discharge medications.  Relevant  Imaging Results:  Relevant Lab Results:   Additional Information ss [id number]  Allayne Butcher, RN

## 2021-01-25 NOTE — Progress Notes (Signed)
[facility] Room 118 [facility]) Hospital Liaison RN note:  Received new referral for [facility] out patient palliative program to follow post discharge from [place], TOC. Patient information given to referral. [facility] Liaison will follow for disposition. Plan is to discharge to [facility].  Thank you for this referral.  Cyndra Numbers, RN [facility] Liaison [phone]

## 2021-01-25 NOTE — Progress Notes (Signed)
PROGRESS NOTE    Monica Downs  ZOX:[id number] DOB: November 01, 1940 DOA: 01/23/2021 PCP: Wilford Corner, PA-C   Assessment & Plan:   Principal Problem:   AMS (altered mental status) Active Problems:   CAD (coronary artery disease)   Hypothyroidism, unspecified   History of TIA (transient ischemic attack)   Advanced dementia (HCC)   Hyponatremia   History of subarachnoid hemorrhage   Dysphagia   Dyspnea   Underweight   Pneumonia   Metabolic encephalopathy: etiology unclear. Hx of subarachnoid hemorrhage & dementia.    Presyncope: brief episode while sitting at the side of the bed for physical therapy. Possibly secondary to dehydration vs orthostatic.   Possible pneumonia: possibly secondary to aspiration. Allergic to penicillins. Will continue on IV doxycycline   Hyponatremia: trending up today. Will continue to monitor   Dementia & functional quadriplegia: advanced. Continue w/ supportive care. Will hold on palliative care consult currently as per pt's daughter   Hx of CAD: continue on aspirin, statin   Hypothyroidism: continue on home dose of synthroid   Hx of subarachnoid hemorrhage: continue w/ supportive care  Likely severe protein calorie malnutrition: BMI 15.8. Will consult nutrition tomorrow as not available on the weekends  DVT prophylaxis: lovenox  Code Status: full  Family Communication: discussed pt's care w/ pt's daughter and answered her questions Disposition Plan: unclear, likely back to home facility   Level of care: Med-Surg   Status is: Inpatient  Remains inpatient appropriate because:IV treatments appropriate due to intensity of illness or inability to take PO and Inpatient level of care appropriate due to severity of illness   Dispo: The patient is from: Home facility               Anticipated d/c is to: Home facility               Patient currently is not medically stable to d/c.   Difficult to place patient  No         Consultants:      Procedures:   Antimicrobials: doxycyline   Subjective: Pt was lethargic.   Objective: Vitals:   01/24/21 1532 01/24/21 2023 01/25/21 0027 01/25/21 0514  BP: 103/63 (!) 100/57 107/72 112/71  Pulse: 72 67 (!) 56 (!) 54  Resp: 16 18 16 18   Temp: 98.1 F (36.7 C) 98.7 F (37.1 C) 98.2 F (36.8 C) 98.3 F (36.8 C)  TempSrc:  Oral  Oral  SpO2: 97% 97% 96% 100%  Weight:      Height:        Intake/Output Summary (Last 24 hours) at 01/25/2021 0707 Last data filed at 01/25/2021 0659 Gross per 24 hour  Intake 120 ml  Output 300 ml  Net -180 ml   Filed Weights   01/23/21 1730  Weight: 43.1 kg    Examination:  General exam: Appears comfortable Respiratory system: decreased breath sounds b/l. No rales or wheezes Cardiovascular system: S1/S2+. No rubs or clicks  Gastrointestinal system: Abd is soft, NT, ND & normal bowel sounds  Central nervous system: Lethargic  Psychiatry: judgement and insight appear abnormal. Flat mood and affect      Data Reviewed: I have personally reviewed following labs and imaging studies  CBC: Recent Labs  Lab 01/23/21 1722 01/23/21 2221 01/24/21 0434 01/25/21 0459  WBC 7.1 9.6 7.7 7.3  NEUTROABS 3.8  --   --   --   HGB 11.6* 10.7* 11.0* 11.3*  HCT 34.0* 31.9* 31.2* 33.0*  MCV 90.9  91.1 88.1 90.2  PLT 253 236 222 238   Basic Metabolic Panel: Recent Labs  Lab 01/23/21 1722 01/23/21 1924 01/24/21 0434 01/25/21 0459  NA 125* 127* 128* 129*  K 3.6 3.9 3.6 3.5  CL 87* 93* 95* 94*  CO2 27 25 25 27   GLUCOSE 130* 116* 82 75  BUN 13 11 10 14   CREATININE 0.48 0.46 0.47 0.43*  CALCIUM 8.4* 8.1* 7.9* 7.9*   GFR: Estimated Creatinine Clearance: 37.5 mL/min (A) (by C-G formula based on SCr of 0.43 mg/dL (L)). Liver Function Tests: No results for input(s): AST, ALT, ALKPHOS, BILITOT, PROT, ALBUMIN in the last 168 hours. No results for input(s): LIPASE, AMYLASE in the last 168 hours. No results  for input(s): AMMONIA in the last 168 hours. Coagulation Profile: No results for input(s): INR, PROTIME in the last 168 hours. Cardiac Enzymes: No results for input(s): CKTOTAL, CKMB, CKMBINDEX, TROPONINI in the last 168 hours. BNP (last 3 results) No results for input(s): PROBNP in the last 8760 hours. HbA1C: No results for input(s): HGBA1C in the last 72 hours. CBG: No results for input(s): GLUCAP in the last 168 hours. Lipid Profile: No results for input(s): CHOL, HDL, LDLCALC, TRIG, CHOLHDL, LDLDIRECT in the last 72 hours. Thyroid Function Tests: No results for input(s): TSH, T4TOTAL, FREET4, T3FREE, THYROIDAB in the last 72 hours. Anemia Panel: No results for input(s): VITAMINB12, FOLATE, FERRITIN, TIBC, IRON, RETICCTPCT in the last 72 hours. Sepsis Labs: Recent Labs  Lab 01/23/21 2221  PROCALCITON <0.10    Recent Results (from the past 240 hour(s))  SARS CORONAVIRUS 2 (TAT 6-24 HRS) Nasopharyngeal Nasopharyngeal Swab     Status: None   Collection Time: 01/23/21 11:04 PM   Specimen: Nasopharyngeal Swab  Result Value Ref Range Status   SARS Coronavirus 2 NEGATIVE NEGATIVE Final    Comment: (NOTE) SARS-CoV-2 target nucleic acids are NOT DETECTED.  The SARS-CoV-2 RNA is generally detectable in upper and lower respiratory specimens during the acute phase of infection. Negative results do not preclude SARS-CoV-2 infection, do not rule out co-infections with other pathogens, and should not be used as the sole basis for treatment or other patient management decisions. Negative results must be combined with clinical observations, patient history, and epidemiological information. The expected result is Negative.  Fact Sheet for Patients: 2222  Fact Sheet for Healthcare Providers: 01/25/21  This test is not yet approved or cleared by the [employer] FDA and  has been authorized for detection and/or  diagnosis of SARS-CoV-2 by FDA under an Emergency Use Authorization (EUA). This EUA will remain  in effect (meaning this test can be used) for the duration of the COVID-19 declaration under Se ction 564(b)(1) of the Act, 21 U.S.C. section 360bbb-3(b)(1), unless the authorization is terminated or revoked sooner.  Performed at [facility], [address], [place], [address] [place]          Radiology Studies: CT HEAD WO CONTRAST  Result Date: 01/23/2021 CLINICAL DATA:  Mental status change. EXAM: CT HEAD WITHOUT CONTRAST TECHNIQUE: Contiguous axial images were obtained from the base of the skull through the vertex without intravenous contrast. COMPARISON:  Most recent head CT 12/16/2020 FINDINGS: Brain: Stable degree of atrophy. Moderate chronic small vessel ischemia. No intracranial hemorrhage, mass effect, or midline shift. No hydrocephalus. The basilar cisterns are patent. No evidence of territorial infarct or acute ischemia. No extra-axial or intracranial fluid collection. Vascular: Atherosclerosis of skullbase vasculature without hyperdense vessel or abnormal calcification. Skull: No fracture  or focal lesion. Sinuses/Orbits: No acute finding. Subtotal opacification of left mastoid air cells is unchanged from prior exam. Other: None. IMPRESSION: 1. No acute intracranial abnormality. 2. Stable atrophy and chronic small vessel ischemia. Electronically Signed   By: Narda Rutherford M.D.   On: 01/23/2021 22:37   DG Chest Portable 1 View  Result Date: 01/23/2021 CLINICAL DATA:  Shortness of breath EXAM: PORTABLE CHEST 1 VIEW COMPARISON:  09/22/2019 FINDINGS: Stable cardiomediastinal contours. Atherosclerotic calcification of the aortic knob. Small focal opacity within the left mid lung. Right lung is clear. No pleural effusion or pneumothorax. IMPRESSION: Small focal opacity within the left mid lung may represent atelectasis or pneumonia. Radiographic follow-up after appropriate treatment  is recommended to document resolution. Electronically Signed   By: Duanne Guess D.O.   On: 01/23/2021 17:52        Scheduled Meds: . enoxaparin (LOVENOX) injection  30 mg Subcutaneous Q24H  . levothyroxine  75 mcg Oral Q0600   Continuous Infusions: . doxycycline (VIBRAMYCIN) IV 100 mg (01/24/21 2050)     LOS: 1 day    Time spent: 30 mins    Charise Killian, MD [facility] Pager [phone]  If 7PM-7AM, please contact night-coverage 01/25/2021, 7:07 AM

## 2021-01-25 NOTE — TOC Initial Note (Signed)
Transition of Care [facility]) - Initial/Assessment Note    Patient Details  Name: Monica Downs MRN: [id number] Date of Birth: May 31, 1941  [facility] CM/SW Contact:    Allayne Butcher, RN Phone Number: 01/25/2021, 4:19 PM  Clinical Narrative:                 Patient admitted to the hospital with hyponatremia.  Patient is from [facility] there for long term care.  Patient had dementia, is bed bound and non verbal.  MD thinks patient will be ready for dc within the net 24-48 hours.  RNCM has spoken with patient's daughter, Derrill Memo would like for the patient to return to [facility] and would also like for the patient to be followed by OP Palliative with [facility] at discharge.  Referral for OP Palliative given to [facility] with [facility].   Expected Discharge Plan: Skilled Nursing Facility Barriers to Discharge: Continued Medical Work up   Patient Goals and CMS Choice Patient states their goals for this hospitalization and ongoing recovery are:: Patient's daughter would like for patient to return to [facility] with OP Palliative Care CMS Medicare.gov Compare Post Acute Care list provided to:: Patient Represenative (must comment) Choice offered to / list presented to : Adult Children  Expected Discharge Plan and Services Expected Discharge Plan: Skilled Nursing Facility   Discharge Planning Services: CM Consult Post Acute Care Choice: Skilled Nursing Facility Living arrangements for the past 2 months: Single Family Home                 DME Arranged: N/A DME Agency: NA       HH Arranged: NA          Prior Living Arrangements/Services Living arrangements for the past 2 months: Single Family Home Lives with:: Facility Resident Patient language and need for interpreter reviewed:: Yes Do you feel safe going back to the place where you live?: Yes      Need for Family Participation in Patient Care: Yes (Comment) (dementia) Care  giver support system in place?: Yes (comment) (daughter)   Criminal Activity/Legal Involvement Pertinent to Current Situation/Hospitalization: No - Comment as needed  Activities of Daily Living Home Assistive Devices/Equipment: None ADL Screening (condition at time of admission) Patient's cognitive ability adequate to safely complete daily activities?: No Is the patient deaf or have difficulty hearing?: No Does the patient have difficulty seeing, even when wearing glasses/contacts?: No Does the patient have difficulty concentrating, remembering, or making decisions?: Yes Patient able to express need for assistance with ADLs?: Yes Does the patient have difficulty dressing or bathing?: Yes Independently performs ADLs?: No Does the patient have difficulty walking or climbing stairs?: Yes Weakness of Legs: Both Weakness of Arms/Hands: Both  Permission Sought/Granted Permission sought to share information with : Case Manager,Family Electrical engineer Permission granted to share information with : Yes, Verbal Permission Granted  Share Information with NAME: [name]  Permission granted to share info w AGENCY: Neurosurgeon granted to share info w Relationship: daughter     Emotional Assessment       Orientation: : Oriented to Self Alcohol / Substance Use: Not Applicable Psych Involvement: No (comment)  Admission diagnosis:  Hyponatremia [E87.1] Syncope [R55] Syncope, unspecified syncope type [R55] Pneumonia [J18.9] Patient Active Problem List   Diagnosis Date Noted  . Pneumonia 01/24/2021  . TBI (traumatic brain injury) (HCC) 01/23/2021  . Hyponatremia 01/23/2021  . History of subarachnoid hemorrhage  01/23/2021  . Dysphagia 01/23/2021  . Dyspnea 01/23/2021  . Underweight 01/23/2021  . Altered mental status 09/04/2019  . Acute cystitis with hematuria   . Nondisplaced posterior arch fracture of first cervical vertebra with routine healing    . COVID-19   . Fall   . Hypokalemia 09/03/2019  . Acute lower UTI 09/03/2019  . AMS (altered mental status) 09/03/2019  . Elevated troponin 09/03/2019  . Unspecified dementia with behavioral disturbance (HCC) 01/02/2019  . Advanced dementia (HCC) 12/30/2018  . Essential hypertension 01/26/2018  . History of TIA (transient ischemic attack) 01/26/2018  . Traumatic subarachnoid hemorrhage (HCC) 04/11/2016  . Hypothyroidism, unspecified 01/15/2016  . 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 Atherosclerotic heart disease of native coronary artery without angina pectoris 02/06/2013  . 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 CAD (coronary artery disease)   . HLD (hyperlipidemia)    PCP:  Wilford Corner, PA-C Pharmacy:   [facility], [facility] - [address] [place] [address] Phone: [phone] Fax: [phone]     Social Determinants of Health (SDOH) Interventions    Readmission Risk Interventions No flowsheet data found.

## 2021-01-26 LAB — CBC
HCT: 31.1 % — ABNORMAL LOW (ref 36.0–46.0)
Hemoglobin: 10.6 g/dL — ABNORMAL LOW (ref 12.0–15.0)
MCH: 30.9 pg (ref 26.0–34.0)
MCHC: 34.1 g/dL (ref 30.0–36.0)
MCV: 90.7 fL (ref 80.0–100.0)
Platelets: 225 10*3/uL (ref 150–400)
RBC: 3.43 MIL/uL — ABNORMAL LOW (ref 3.87–5.11)
RDW: 13.3 % (ref 11.5–15.5)
WBC: 7.2 10*3/uL (ref 4.0–10.5)
nRBC: 0 % (ref 0.0–0.2)

## 2021-01-26 LAB — BASIC METABOLIC PANEL
Anion gap: 9 (ref 5–15)
BUN: 15 mg/dL (ref 8–23)
CO2: 27 mmol/L (ref 22–32)
Calcium: 7.9 mg/dL — ABNORMAL LOW (ref 8.9–10.3)
Chloride: 94 mmol/L — ABNORMAL LOW (ref 98–111)
Creatinine, Ser: 0.45 mg/dL (ref 0.44–1.00)
GFR, Estimated: >60 mL/min (ref >60)
Glucose, Bld: 92 mg/dL (ref 70–99)
Potassium: 3.4 mmol/L — ABNORMAL LOW (ref 3.5–5.1)
Sodium: 130 mmol/L — ABNORMAL LOW (ref 135–145)

## 2021-01-26 LAB — RESP PANEL BY RT-PCR (FLU A&B, COVID) ARPGX2
Influenza A by PCR: NEGATIVE
Influenza B by PCR: NEGATIVE
SARS Coronavirus 2 by RT PCR: NEGATIVE

## 2021-01-26 MED ORDER — ENSURE PO LIQD: 237.0000 mL | Freq: Three times a day (TID) | ORAL | 0 refills | Status: AC

## 2021-01-26 MED ORDER — POTASSIUM CHLORIDE 10 MEQ/100ML IV SOLN
10.0000 meq | INTRAVENOUS | Status: AC
  Administered 2021-01-26 (×2): 10 meq via INTRAVENOUS
  Filled 2021-01-26: qty 100

## 2021-01-26 MED ORDER — DOXYCYCLINE MONOHYDRATE 100 MG PO TABS: 100.0000 mg | ORAL_TABLET | Freq: Two times a day (BID) | ORAL | 0 refills | Status: AC

## 2021-01-26 NOTE — Discharge Summary (Signed)
Physician Discharge Summary  Monica Downs QZE:[id number] DOB: May 04, 1941 DOA: 01/23/2021  PCP: Wilford Corner, PA-C  Admit date: 01/23/2021 Discharge date: 01/26/2021  Admitted From: SNF (home facility) Disposition:  SNF (home facility)  Recommendations for Outpatient Follow-up:  1. Follow up with PCP in 1-2 weeks 2. Needs repeat CXR in 4-6 weeks to confirm resolution of pneumonia  Home Health: no  Equipment/Devices:  Discharge Condition: stable  CODE STATUS: DNR Diet recommendation: Dysphagia I diet, ENSURE TID w/ meals   Brief/Interim Summary: HPI was taken from Dr. Para March: Monica Downs is a 80 y.o. female with medical history significant for Hypothyroidism, TIA, traumatic subarachnoid hemorrhage, CAD with LAD DES, advanced dementia, bedbound, mostly nonverbal, incontinent and total care dependent, but mostly alert at baseline and with good appetite albeit on pured diet due to dysphagia related to dementia, who was brought in for evaluation of a brief episode of altered mental status associated with shortness of breath.  Most of the history is taken from the daughter over the phone due to patient's dementia.  She states that patient was at baseline until about 2 weeks ago when she noticed that she was very lethargic and sleeping a lot which is not usual for her.  She requested that she receive physical therapy which was initiated yesterday.  Today as she was sitting on the side of the bed she slumped and became short of breath but quickly came around.  She did not fall.  According to her daughter, of late her oral intake was very diminished and typically she has a robust appetite in spite of her advanced dementia.  She states that she chokes on her food quite a bit and this would interrupt the administration of her food by the staff.  States she had an episode of choking on the day prior to coming in. ED course: On arrival, patient was alert, afebrile, BP 102/74, pulse 63 with O2  sat 96% on room air.  Blood work significant for sodium of 125.  Otherwise unremarkable.  Troponin normal at 4, BNP 16, WBC 7100.  Creatinine 0.46. EKG, viewed and interpreted myself sinus rhythm at 64 with right bundle branch block and no acute ST-T wave changes Imaging: Small focal opacity within the left midlung may represent atelectasis or pneumonia.  Radiographic follow-up after appropriate treatment is recommended to document resolution  Patient was given a normal saline bolus in the emergency room.  Hospitalist consulted for admission.   Hospital course from Dr. Mayford Knife 4/24-4/26/22: Pt was presented w/ possible pneumonia and was treated w/ IV doxycyline and will be d/c back to home facility w/ po doxycycline to finish the course. Pt also received IVFs while inpatient. Pt is back to her baseline. Of note, [facility] outpatient palliative program will follow the pt post d/c. For more information, please see other progress notes.   Discharge Diagnoses:  Principal Problem:   AMS (altered mental status) Active Problems:   CAD (coronary artery disease)   Hypothyroidism, unspecified   History of TIA (transient ischemic attack)   Advanced dementia (HCC)   Hyponatremia   History of subarachnoid hemorrhage   Dysphagia   Dyspnea   Underweight   Pneumonia   Protein-calorie malnutrition, severe  Metabolic encephalopathy: etiology unclear. Hx of subarachnoid hemorrhage & dementia. Back to baseline   Presyncope: brief episode while sitting at the side of the bed for physical therapy. Possibly secondary to dehydration vs orthostatic.   Possible pneumonia: possibly secondary to aspiration. Allergic to penicillins.  Will continue on doxycycline   Hyponatremia: trending up slowly. Will continue to monitor   Dementia & functional quadriplegia: advanced. Continue w/ supportive care. Will hold on palliative care consult currently as per pt's daughter   Hx of CAD: continue on aspirin, statin    Hypothyroidism: continue on home dose of synthroid   Hx of subarachnoid hemorrhage: continue w/ supportive care  Likely severe protein calorie malnutrition: BMI 15.8. Continue w/ ensure    Discharge Instructions  Discharge Instructions    Diet - low sodium heart healthy   Complete by: As directed    Dysphagia I diet   Discharge instructions   Complete by: As directed    F/u w/ PCP in 1-2 weeks. Will need repeat CXR in 4-6 weeks to confirm resolution of pneumonia. F/u w/ [facility] out patient palliative program in 1-3 days   Increase activity slowly   Complete by: As directed      Allergies as of 01/26/2021      Reactions   Azithromycin Anaphylaxis   Hydrocodone-acetaminophen Anaphylaxis   Mobic [meloxicam] Swelling   Ondansetron Hcl Other (See Comments), Swelling   BLOOD CLOT;caused cardiac arrest BLOOD CLOT;caused cardiac arrest   Penicillins Shortness Of Breath   Has patient had a PCN reaction causing immediate rash, facial/tongue/throat swelling, SOB or lightheadedness with hypotension: Yes Has patient had a PCN reaction causing severe rash involving mucus membranes or skin necrosis: No Has patient had a PCN reaction that required hospitalization: No Has patient had a PCN reaction occurring within the last 10 years: No If all of the above answers are "NO", then may proceed with Cephalosporin use.   Percocet [oxycodone-acetaminophen] Anaphylaxis   Reclast [zoledronic Acid] Swelling   Vicodin [hydrocodone-acetaminophen] Anaphylaxis   Codeine Other (See Comments)   [facility] after surgery   Ibandronic Acid    Oxycodone Swelling   Swelling of tongue   Zofran [ondansetron]    BLOOD CLOT;caused cardiac arrest      Medication List    TAKE these medications   aspirin 81 MG tablet Take 81 mg by mouth daily.   atorvastatin 40 MG tablet Commonly known as: LIPITOR TAKE ONE TABLET BY MOUTH EVERY DAY   Azelastine HCl 137 MCG/SPRAY  Soln Place 1 spray into both nostrils 2 (two) times daily.   ciclopirox 0.77 % cream Commonly known as: LOPROX Apply topically. Apply to toenails topically every day shift for fungal infection for 6 months   denosumab 60 MG/ML Soln injection Commonly known as: PROLIA Inject 60 mg into the skin every 6 (six) months. Administer in upper arm, thigh, or abdomen   donepezil 10 MG tablet Commonly known as: ARICEPT Take 10 mg by mouth daily.   doxycycline 100 MG tablet Commonly known as: ADOXA Take 1 tablet (100 mg total) by mouth 2 (two) times daily for 2 days.   Ensure Take 237 mLs by mouth with breakfast, with lunch, and with evening meal. What changed: when to take this   ferrous sulfate 325 (65 FE) MG tablet Take 325 mg by mouth daily with breakfast.   fluticasone 50 MCG/ACT nasal spray Commonly known as: FLONASE Place 1 spray into both nostrils daily as needed for allergies or rhinitis (congestion).   levothyroxine 75 MCG tablet Commonly known as: SYNTHROID Take 75 mcg by mouth daily before breakfast.   multivitamin with minerals Tabs tablet Take 1 tablet by mouth daily.   mupirocin ointment 2 % Commonly known as: A[employer]ren CorporationBACTROBAN Apply  1 application topically 2 (two) times daily. (apply to skin tears)   sertraline 25 MG tablet Commonly known as: ZOLOFT Take 25 mg by mouth daily.   traZODone 50 MG tablet Commonly known as: DESYREL Take 50 mg by mouth at bedtime.   Vitamin D3 25 MCG (1000 UT) Caps Take 1,000 Units by mouth daily.       Allergies  Allergen Reactions  . Azithromycin Anaphylaxis  . Hydrocodone-Acetaminophen Anaphylaxis  . Mobic [Meloxicam] Swelling  . Ondansetron Hcl Other (See Comments) and Swelling    BLOOD CLOT;caused cardiac arrest BLOOD CLOT;caused cardiac arrest  . Penicillins Shortness Of Breath    Has patient had a PCN reaction causing immediate rash, facial/tongue/throat swelling, SOB or lightheadedness with hypotension: Yes Has patient  had a PCN reaction causing severe rash involving mucus membranes or skin necrosis: No Has patient had a PCN reaction that required hospitalization: No Has patient had a PCN reaction occurring within the last 10 years: No If all of the above answers are "NO", then may proceed with Cephalosporin use.   Marland Kitchen Percocet [Oxycodone-Acetaminophen] Anaphylaxis  . Reclast [Zoledronic Acid] Swelling  . Vicodin [Hydrocodone-Acetaminophen] Anaphylaxis  . Codeine Other (See Comments)    Went into cardiac arrest at [facility] with this after surgery  . Ibandronic Acid   . Oxycodone Swelling    Swelling of tongue  . Zofran [Ondansetron]     BLOOD CLOT;caused cardiac arrest    Consultations:   Procedures/Studies: CT HEAD WO CONTRAST  Result Date: 01/23/2021 CLINICAL DATA:  Mental status change. EXAM: CT HEAD WITHOUT CONTRAST TECHNIQUE: Contiguous axial images were obtained from the base of the skull through the vertex without intravenous contrast. COMPARISON:  Most recent head CT 12/16/2020 FINDINGS: Brain: Stable degree of atrophy. Moderate chronic small vessel ischemia. No intracranial hemorrhage, mass effect, or midline shift. No hydrocephalus. The basilar cisterns are patent. No evidence of territorial infarct or acute ischemia. No extra-axial or intracranial fluid collection. Vascular: Atherosclerosis of skullbase vasculature without hyperdense vessel or abnormal calcification. Skull: No fracture or focal lesion. Sinuses/Orbits: No acute finding. Subtotal opacification of left mastoid air cells is unchanged from prior exam. Other: None. IMPRESSION: 1. No acute intracranial abnormality. 2. Stable atrophy and chronic small vessel ischemia. Electronically Signed   By: Narda Rutherford M.D.   On: 01/23/2021 22:37   DG Chest Portable 1 View  Result Date: 01/23/2021 CLINICAL DATA:  Shortness of breath EXAM: PORTABLE CHEST 1 VIEW COMPARISON:  09/22/2019 FINDINGS: Stable cardiomediastinal contours. Atherosclerotic  calcification of the aortic knob. Small focal opacity within the left mid lung. Right lung is clear. No pleural effusion or pneumothorax. IMPRESSION: Small focal opacity within the left mid lung may represent atelectasis or pneumonia. Radiographic follow-up after appropriate treatment is recommended to document resolution. Electronically Signed   By: Duanne Guess D.O.   On: 01/23/2021 17:52       Subjective: Pt is nonverbal    Discharge Exam: Vitals:   01/26/21 0423 01/26/21 0813  BP: 121/80 125/85  Pulse: 66 67  Resp: 14 17  Temp: 97.9 F (36.6 C) 98.1 F (36.7 C)  SpO2: 99% 100%   Vitals:   01/25/21 2016 01/25/21 2346 01/26/21 0423 01/26/21 0813  BP: (!) 132/120 116/72 121/80 125/85  Pulse: 69 71 66 67  Resp: Temp: 99 F (37.2 C) 98.4 F (36.9 C) 97.9 F (36.6 C) 98.1 F (36.7 C)  TempSrc: Oral  Oral   SpO2: 96% 99% 99% 100%  Weight:      Height:        General: Pt is alert, awake, not in acute distress. Frail appearing  Cardiovascular: S1/S2 +, no rubs, no gallops Respiratory: diminished breath sounds b/l  Abdominal: Soft, NT, ND, bowel sounds + Extremities: no cyanosis    The results of significant diagnostics from this hospitalization (including imaging, microbiology, ancillary and laboratory) are listed below for reference.     Microbiology: Recent Results (from the past 240 hour(s))  SARS CORONAVIRUS 2 (TAT 6-24 HRS) Nasopharyngeal Nasopharyngeal Swab     Status: None   Collection Time: 01/23/21 11:04 PM   Specimen: Nasopharyngeal Swab  Result Value Ref Range Status   SARS Coronavirus 2 NEGATIVE NEGATIVE Final    Comment: (NOTE) SARS-CoV-2 target nucleic acids are NOT DETECTED.  The SARS-CoV-2 RNA is generally detectable in upper and lower respiratory specimens during the acute phase of infection. Negative results do not preclude SARS-CoV-2 infection, do not rule out co-infections with other pathogens, and should not be used as  the sole basis for treatment or other patient management decisions. Negative results must be combined with clinical observations, patient history, and epidemiological information. The expected result is Negative.  Fact Sheet for Patients: HairSlick.no  Fact Sheet for Healthcare Providers: quierodirigir.com  This test is not yet approved or cleared by the [place] FDA and  has been authorized for detection and/or diagnosis of SARS-CoV-2 by FDA under an Emergency Use Authorization (EUA). This EUA will remain  in effect (meaning this test can be used) for the duration of the COVID-19 declaration under Se ction 564(b)(1) of the Act, 21 U.S.C. section 360bbb-3(b)(1), unless the authorization is terminated or revoked sooner.  Performed at [facility], [address] [place], [place] [address]      Labs: BNP (last 3 results) Recent Labs    01/23/21 1722  BNP 16.2   Basic Metabolic Panel: Recent Labs  Lab 01/23/21 1722 01/23/21 1924 01/24/21 0434 01/25/21 0459 01/26/21 0346  NA 125* 127* 128* 129* 130*  K 3.6 3.9 3.6 3.5 3.4*  CL 87* 93* 95* 94* 94*  CO2 27 25 25 27 27   GLUCOSE 130* 116* 82 75 92  BUN 13 11 10 14 15   CREATININE 0.48 0.46 0.47 0.43* 0.45  CALCIUM 8.4* 8.1* 7.9* 7.9* 7.9*   Liver Function Tests: No results for input(s): AST, ALT, ALKPHOS, BILITOT, PROT, ALBUMIN in the last 168 hours. No results for input(s): LIPASE, AMYLASE in the last 168 hours. No results for input(s): AMMONIA in the last 168 hours. CBC: Recent Labs  Lab 01/23/21 1722 01/23/21 2221 01/24/21 0434 01/25/21 0459 01/26/21 0346  WBC 7.1 9.6 7.7 7.3 7.2  NEUTROABS 3.8  --   --   --   --   HGB 11.6* 10.7* 11.0* 11.3* 10.6*  HCT 34.0* 31.9* 31.2* 33.0* 31.1*  MCV 90.9 91.1 88.1 90.2 90.7  PLT 253 236 222 238 225   Cardiac Enzymes: No results for input(s): CKTOTAL, CKMB, CKMBINDEX, TROPONINI in the last 168  hours. BNP: Invalid input(s): POCBNP CBG: No results for input(s): GLUCAP in the last 168 hours. D-Dimer No results for input(s): DDIMER in the last 72 hours. Hgb A1c No results for input(s): HGBA1C in the last 72 hours. Lipid Profile No results for input(s): CHOL, HDL, LDLCALC, TRIG, CHOLHDL, LDLDIRECT in the last 72 hours. Thyroid function studies No results for input(s): TSH, T4TOTAL, T3FREE, THYROIDAB in the last 72 hours.  Invalid input(s): FREET3 Anemia  work up No results for input(s): VITAMINB12, FOLATE, FERRITIN, TIBC, IRON, RETICCTPCT in the last 72 hours. Urinalysis    Component Value Date/Time   COLORURINE YELLOW (A) 01/23/2021 2304   APPEARANCEUR HAZY (A) 01/23/2021 2304   APPEARANCEUR Clear 09/30/2016 0934   LABSPEC 1.019 01/23/2021 2304   PHURINE 5.0 01/23/2021 2304   GLUCOSEU NEGATIVE 01/23/2021 2304   HGBUR NEGATIVE 01/23/2021 2304   BILIRUBINUR NEGATIVE 01/23/2021 2304   BILIRUBINUR Negative 09/30/2016 0934   KETONESUR 5 (A) 01/23/2021 2304   PROTEINUR NEGATIVE 01/23/2021 2304   NITRITE NEGATIVE 01/23/2021 2304   LEUKOCYTESUR NEGATIVE 01/23/2021 2304   Sepsis Labs Invalid input(s): PROCALCITONIN,  WBC,  LACTICIDVEN Microbiology Recent Results (from the past 240 hour(s))  SARS CORONAVIRUS 2 (TAT 6-24 HRS) Nasopharyngeal Nasopharyngeal Swab     Status: None   Collection Time: 01/23/21 11:04 PM   Specimen: Nasopharyngeal Swab  Result Value Ref Range Status   SARS Coronavirus 2 NEGATIVE NEGATIVE Final    Comment: (NOTE) SARS-CoV-2 target nucleic acids are NOT DETECTED.  The SARS-CoV-2 RNA is generally detectable in upper and lower respiratory specimens during the acute phase of infection. Negative results do not preclude SARS-CoV-2 infection, do not rule out co-infections with other pathogens, and should not be used as the sole basis for treatment or other patient management decisions. Negative results must be combined with clinical  observations, patient history, and epidemiological information. The expected result is Negative.  Fact Sheet for Patients: HairSlick.no  Fact Sheet for Healthcare Providers: quierodirigir.com  This test is not yet approved or cleared by the [place] FDA and  has been authorized for detection and/or diagnosis of SARS-CoV-2 by FDA under an Emergency Use Authorization (EUA). This EUA will remain  in effect (meaning this test can be used) for the duration of the COVID-19 declaration under Se ction 564(b)(1) of the Act, 21 U.S.C. section 360bbb-3(b)(1), unless the authorization is terminated or revoked sooner.  Performed at [facility], [address], [place], [place] [address]      Time coordinating discharge: Over 30 minutes  SIGNED:   Charise Killian, MD  [facility] 01/26/2021, 11:27 AM Pager   If 7PM-7AM, please contact night-coverage

## 2021-01-26 NOTE — TOC Transition Note (Signed)
Transition of Care [facility]) - CM/SW Discharge Note   Patient Details  Name: Monica Downs MRN: [id number] Date of Birth: 07/23/1941  [facility] CM/SW Contact:  Allayne Butcher, RN Phone Number: 01/26/2021, 12:32 PM   Clinical Narrative:    Patient will discharge to [facility] today.  RN still has IV antibiotic to run in.  Covid is negative.  Patient will go to room 45A, bedside RN will call report.  Transport arranged for 1530-1600 this afternoon with [facility].  Daughter updated on discharge today.    Final next level of care: Skilled Nursing Facility Barriers to Discharge: Barriers Resolved   Patient Goals and CMS Choice Patient states their goals for this hospitalization and ongoing recovery are:: Patient's daughter would like for patient to return to [facility] with OP Palliative Care CMS Medicare.gov Compare Post Acute Care list provided to:: Patient Represenative (must comment) Choice offered to / list presented to : Adult Children  Discharge Placement   Existing PASRR number confirmed : 01/25/21          Patient chooses bed at: [facility] Patient to be transferred to facility by: [facility] Name of family member notified: Waneta Martins- daughter Patient and family notified of of transfer: 01/26/21  Discharge Plan and Services   Discharge Planning Services: CM Consult Post Acute Care Choice: Skilled Nursing Facility          DME Arranged: N/A DME Agency: NA       HH Arranged: NA          Social Determinants of Health (SDOH) Interventions     Readmission Risk Interventions No flowsheet data found.

## 2021-01-26 NOTE — Plan of Care (Signed)
Patient is being discharged to [facility]. Report has been called. IV has been removed. EMS will transport patient to facility

## 2021-01-28 ENCOUNTER — Ambulatory Visit: Payer: Medicare Other | Admitting: Internal Medicine

## 2021-01-29 ENCOUNTER — Encounter: Payer: Self-pay | Admitting: Nurse Practitioner

## 2021-01-29 ENCOUNTER — Other Ambulatory Visit: Payer: Self-pay

## 2021-01-29 ENCOUNTER — Non-Acute Institutional Stay: Payer: Medicare Other | Admitting: Nurse Practitioner

## 2021-01-29 VITALS — BP 116/60 | HR 68 | Temp 97.8°F | Resp 18 | Wt 99.0 lb

## 2021-01-29 DIAGNOSIS — Z515 Encounter for palliative care: Secondary | ICD-10-CM

## 2021-01-29 DIAGNOSIS — R5381 Other malaise: Secondary | ICD-10-CM

## 2021-01-29 NOTE — Progress Notes (Signed)
[facility] Pall[facility] Consult Note Telephone: [phone]  Fax: [phone]    Date of encounter: 01/29/21 PATIENT NAME: Monica Downs [id number]   [phone] (home)  DOB: 22-May-1941 MRN: [id number] PRIMARY CARE PROVIDER:   Dr [facility] [address] [place], [place],  [place] [address] [phone]  RESPONSIBLE PARTY:    Contact Information    Name Relation Home Work [place] Daughter  [phone] [place] [phone]     Hubbard Robinson Brother   [phone]   Tew,Joyce Sister [phone]       I met face to face with patient in facility. Palliative Care was asked to follow this patient by consultation request of Dr Reesa Chew, to address advance care planning and complex medical decision making. This is the initial visit.   ASSESSMENT AND PLAN / RECOMMENDATIONS:   Advance Care Planning/Goals of Care: Goals include to maximize quality of life and symptom management. Our advance care planning conversation included a discussion about:     The value and importance of advance care planning   Experiences with loved ones who have been seriously ill or have died   Exploration of personal, cultural or spiritual beliefs that might influence medical decisions   Exploration of goals of care in the event of a sudden injury or illness   Identification and preparation of a healthcare agent   Review and updating or creation of an  advance directive document .  Decision not to resuscitate or to de-escalate disease focused treatments due to poor prognosis.  CODE STATUS: DNR  Symptom Management/Plan: 1. ACP: DNR will place in vynca  2. Debility, secondary to dementia, progressive, continue supportive role, monitoring; encourage oob as able  3. Palliative care encounter; Palliative care encounter; Palliative medicine team will continue to  support patient, patient's family, and medical team. Visit consisted of counseling and education dealing with the complex and emotionally intense issues of symptom management and palliative care in the setting of serious and potentially life-threatening illness  Follow up Palliative Care Visit: Palliative care will continue to follow for complex medical decision making, advance care planning, and clarification of goals. Return 2 weeks or prn.  I spent 45 minutes providing this consultation starting at 10:30am. More than 50% of the time in this consultation was spent in counseling and care coordination.  PPS: 30%  HOSPICE ELIGIBILITY/DIAGNOSIS: TBD  Chief Complaint: Initial palliative consult for complex medical decision making  HISTORY OF PRESENT ILLNESS:  Monica Downs is a 80 y.o. 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 AV fistula placement, Protein calorie malnutrition, history of covid.Seen in Emergency Department [facility] 12/16/2020 for a fall with severe dementia nonverbal. Superficial skin tears on left forearm, tetanus given. CT imaging shows chronic C1 fracture. Discharged back to facility. Hospitalized 01/23/2021 to 01/26/2021 for brief period of altered mental status as her baseline is bedbound, non verbal, total dependent care, incontinence but alert at baseline with a good appetite on a pureed diet do to dysphasia secondary to dementia. Work up consistent with possible pneumonia treated with doxycycline and returned back to facility where she currently  is at [facility]. staff  endorses Monica Downs does require assistance for transfers, mobility, bathing, dressing, toileting as she is incontinent, feeding with it declined appetite. Staff endorses Monica Downs is nonverbal. At present Monica Downs is lying in bed in her room at [facility]. Monica. Downs appears debilitated, then, no distress. Monica Downs does make eye contact with verbal cues. this form was cooperative with assessment, appears comfortable at present time period emotional support provided. I have attempted to contact Monica Downs daughter, will continue to try to reach for further discussion of medical goals of care, past medical history, further discussionsketch of skilled days versus long term care.I updated staff, will continue to try to reach son for further discussion  99 lbs with BMI 18.7 116/60, 97.8, 68  History obtained from review of EMR, discussion with primary team, and interview with staff and  Monica. Cletus Downs. I reviewed available labs, medications, imaging, studies and related documents from the EMR.  Records reviewed and summarized above.   ROS Full 14 system review of systems performed and negative with exception of: as per HPI.  Physical Exam: Constitutional: NAD General: frail appearing, thin, debilitated, chronically ill, confused female EYES: lids intact ENMT: oral mucous membranes moist CV: S1S2, RRR, no LE edema Pulmonary: LCTA, no increased work of breathing, room air Abdomen: decrease intake;  normo-active BS + 4 quadrants, soft and non tender GU: deferred MSK: muscle wasting; bed bound Skin: warm and dry, no rashes or wounds on visible skin Neuro:  + generalized weakness,  + cognitive impairment Psych: flat affect, Alert, confused; non-verbal  CURRENT PROBLEM LIST:  Patient Active Problem List   Diagnosis Date Noted  . Protein-calorie malnutrition, severe 01/25/2021  . Pneumonia 01/24/2021  . TBI (traumatic brain injury) ([place]) 01/23/2021  . Hyponatremia 01/23/2021  .  History of subarachnoid hemorrhage 01/23/2021  . Dysphagia 01/23/2021  . Dyspnea 01/23/2021  . Underweight 01/23/2021  . Altered mental status 09/04/2019  . Acute cystitis with hematuria   . Nondisplaced posterior arch fracture of first cervical vertebra with routine healing   . COVID-19   . Fall   . Hypokalemia 09/03/2019  . Acute lower UTI 09/03/2019  . AMS (altered mental status) 09/03/2019  . Elevated troponin 09/03/2019  . Unspecified dementia with behavioral disturbance ([place]) 01/02/2019  . Advanced dementia ([place]) 12/30/2018  . Essential hypertension 01/26/2018  . History of TIA (transient ischemic attack) 01/26/2018  . Traumatic subarachnoid hemorrhage ([place]) 04/11/2016  . Hypothyroidism, unspecified 01/15/2016  . A-V fistula ([place]) 06/28/2013  . Atherosclerotic heart disease of native coronary artery without angina pectoris 02/06/2013  . 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 CAD (coronary artery disease)   . HLD (hyperlipidemia)    PAST MEDICAL HISTORY:  Active Ambulatory Problems    Diagnosis Date Noted  . CAD (coronary artery disease)   . HLD (hyperlipidemia)   . A-V fistula ([place]) 06/28/2013  . 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 Traumatic subarachnoid hemorrhage ([place]) 04/11/2016  . Unspecified dementia with behavioral disturbance ([place]) 01/02/2019  . Hypokalemia 09/03/2019  . Acute lower UTI 09/03/2019  . AMS (altered mental status) 09/03/2019  . Elevated troponin 09/03/2019  . Acute cystitis with hematuria   . Nondisplaced posterior arch fracture of first cervical vertebra with routine healing   . COVID-19   . Fall   . Altered mental status 09/04/2019  . Essential hypertension 01/26/2018  . Hypothyroidism, unspecified 01/15/2016  . History of TIA (transient ischemic  attack) 01/26/2018  . TBI (traumatic brain injury) ([place]) 01/23/2021  . Atherosclerotic heart disease of native coronary artery without angina pectoris 02/06/2013  . Advanced  dementia ([place]) 12/30/2018  . Hyponatremia 01/23/2021  . History of subarachnoid hemorrhage 01/23/2021  . Dysphagia 01/23/2021  . Dyspnea 01/23/2021  . Underweight 01/23/2021  . Pneumonia 01/24/2021  . Protein-calorie malnutrition, severe 01/25/2021   Resolved Ambulatory Problems    Diagnosis Date Noted  . No Resolved Ambulatory Problems   Past Medical History:  Diagnosis Date  . Allergic rhinitis   . Anxiety   . Clotting disorder ([place])   . Falls   . Hypertension   . Hypothyroidism   . Incontinence   . Left Forearm AV Fistula   . Mitral regurgitation   . Obstructive sleep apnea   . Panic attacks   . Traumatic brain injury [facility])    SOCIAL HX:  Social History   Tobacco Use  . Smoking status: Former Smoker    Packs/day: 0.10    Years: 2.00    Pack years: 0.20    Types: Cigarettes    Quit date: 10/04/1991    Years since quitting: 29.3  . Smokeless tobacco: Never Used  . Tobacco comment: smoked on and off  Substance Use Topics  . Alcohol use: No   FAMILY HX:  Family History  Problem Relation Age of Onset  . Heart disease Mother   . Hyperlipidemia Mother   . Tuberculosis Mother   . Colon cancer Father   . Colon cancer Other   . Lymphoma Other   . Heart disease Other   . Breast cancer Neg Hx     Reviewed  ALLERGIES:  Allergies  Allergen Reactions  . Azithromycin Anaphylaxis  . Hydrocodone-Acetaminophen Anaphylaxis  . Mobic [Meloxicam] Swelling  . Ondansetron Hcl Other (See Comments) and Swelling    BLOOD CLOT;caused cardiac arrest BLOOD CLOT;caused cardiac arrest  . Penicillins Shortness Of Breath    Has patient had a PCN reaction causing immediate rash, facial/tongue/throat swelling, SOB or lightheadedness with hypotension: Yes Has patient had a PCN reaction causing severe rash involving mucus membranes or skin necrosis: No Has patient had a PCN reaction that required hospitalization: No Has patient had a PCN reaction occurring within the last 10 years:  No If all of the above answers are "NO", then may proceed with Cephalosporin use.   Marland Kitchen Percocet [Oxycodone-Acetaminophen] Anaphylaxis  . Reclast [Zoledronic Acid] Swelling  . Vicodin [Hydrocodone-Acetaminophen] Anaphylaxis  . Codeine Other (See Comments)    Went into cardiac arrest at [facility] with this after surgery  . Ibandronic Acid   . Oxycodone Swelling    Swelling of tongue  . Zofran [Ondansetron]     BLOOD CLOT;caused cardiac arrest     Questions and concerns were addressed. The staff was encouraged to call with questions and/or concerns. Provided general support and encouragement, no other unmet needs identified  Thank you for the opportunity to participate in the care of Monica. Cletus Downs.  The palliative care team will continue to follow. Please call our office at [phone] if we can be of additional assistance.   This chart was dictated using voice recognition software. Despite best efforts to proofread, errors can occur which can change the documentation meaning.  Kymani Shimabukuro Ihor Gully, NP , DNP, MSN, [facility]

## 2021-02-01 ENCOUNTER — Non-Acute Institutional Stay: Payer: Medicare Other | Admitting: Nurse Practitioner

## 2021-02-01 ENCOUNTER — Other Ambulatory Visit: Payer: Self-pay

## 2021-02-01 ENCOUNTER — Encounter: Payer: Self-pay | Admitting: Nurse Practitioner

## 2021-02-01 VITALS — BP 111/60 | HR 80 | Temp 97.8°F | Resp 18 | Wt 99.0 lb

## 2021-02-01 DIAGNOSIS — Z515 Encounter for palliative care: Secondary | ICD-10-CM

## 2021-02-01 DIAGNOSIS — R5381 Other malaise: Secondary | ICD-10-CM

## 2021-02-01 DIAGNOSIS — E43 Unspecified severe protein-calorie malnutrition: Secondary | ICD-10-CM

## 2021-02-01 NOTE — Progress Notes (Signed)
[facility] Palliative Care Consult Note Telephone: [phone]  Fax: [phone]    Date of encounter: 02/01/21 PATIENT NAME: Monica Downs [id number]   [phone] (home)  DOB: 10-02-1941 MRN: [id number] PRIMARY CARE PROVIDER:   Dr [facility] [address] [place], [place],  [place] [address] [phone]  RESPONSIBLE PARTY:    Contact Information    Name Relation Home Work Monica Downs  [phone] [place] [phone]     Monica Downs Brother   [phone]   Monica Downs Sister [phone]       I met face to face with patient and family facility. Palliative Care was asked to follow this patient by consultation request of  Dr Reesa Chew  to address advance care planning and complex medical decision making. This is a follow up visit.   ASSESSMENT AND PLAN / RECOMMENDATIONS:   Advance Care Planning/Goals of Care: Goals include to maximize quality of life and symptom management. Our advance care planning conversation included a discussion about:     The value and importance of advance care planning   Experiences with loved ones who have been seriously ill or have died   Exploration of personal, cultural or spiritual beliefs that might influence medical decisions   Exploration of goals of care in the event of a sudden injury or illness   Identification and preparation of a healthcare agent   Review and updating or creation of an  advance directive document .  Decision not to resuscitate or to de-escalate disease focused treatments due to poor prognosis.  CODE STATUS: DNR  Symptom Management/Plan: 1. ACP: DNR in vynca  2. Protein calorie malnutrition; slight improvement with weight 6lbs up; BMI remains 18.7. Downs, Monica Downs continue to feed   3. Debility, secondary to dementia, progressive, continue supportive role, monitoring;  encourage oob as able  4.Palliative care encounter; Palliative care encounter; Palliative medicine team will continue to support patient, patient's family, and medical team. Visit consisted of counseling and education dealing with the complex and emotionally intense issues of symptom management and palliative care in the setting of serious and potentially life-threatening illness  Follow up Palliative Care Visit: Palliative care will continue to follow for complex medical decision making, advance care planning, and clarification of goals. Return 2 weeks or prn.  I spent 55 minutes providing this consultation starting at 12:15pm. More than 50% of the time in this consultation was spent in counseling and care coordination.  PPS: 30%  HOSPICE ELIGIBILITY/DIAGNOSIS: TBD  Chief Complaint: Follow-up Palliative consult for complex medical decision making  HISTORY OF PRESENT ILLNESS:  Monica Downs is a 80 y.o. 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 AV fistula placement, Protein calorie malnutrition, history of covid. Seen in Emergency Department [facility] 12/16/2020 for a fall with severe dementia nonverbal. Superficial skin tears on left forearm, tetanus given. CT imaging shows chronic C1 fracture. Discharged back to facility. Hospitalized 01/23/2021 to 01/26/2021 for brief period of altered mental status as her baseline is bedbound, non verbal, total dependent care, incontinence but alert at baseline with a good appetite on a pureed diet do  to dysphasia secondary to dementia. Work up consistent with possible  pneumonia treated with doxycycline and returned back to facility where she currently is at [facility]. staff endorses Monica Downs does require assistance for transfers, mobility, bathing, dressing, toileting as she is incontinent bowel and bladder. Monica Downs requires to be fed. Staff Downs no other changes. At present, Monica Downs is lying in bed, appears debilitated, thin, confused, non-verbal, no distress. No visitors present. I visited and observed Monica Downs. Monica Downs makes eye contact, non-verbal, cooperative with assessment. Emotional support provided. I called Monica Downs Downs, message left with contact information. Monica Downs returned my call. We talked about the last time Monica Downs was independent, reviewed PMH, chronic disease progression, repeated hospitalizations, falls. We talked about symptoms including appetite. We talked about recent weight gain of 6lbs. Monica Downs. Monica Downs it takes >45 minutes to feed Monica Downs. We talked about progression of dementia. We talked about realistic expectations. We talked about medical goals of care. We talked about residing at facility, challenges with coping strategies. We talked about option of Hospice benefit under Medicare. Monica Downs her father had Hospice and familiar with services. We talked about option of having Hospice Physicians to review case, Monica Downs wishes to continue to with [facility] for now, wait since Monica Downs is gaining weight at this time. We talked about recent hospitalization with wishes to repeat cxr for f/u possible PNA as was put on doxycycline, cmet for hyponatremia, protein calorie malnutrition, cbc for decrease wbc. We talked about presyncope episode appears likely dehydration vs orthostatic with fall risk. We talked about role PC in poc. Monica Downs Oval Linsey NP for Dr Reesa Chew to call her, message sent to [facility] Discussed will f/u 2 weeks or sooner if  declines. I updated staff. Therapeutic listening, emotional support provided. Monica Downs thankful for PC visit and discussion, following  History obtained from review of EMR, discussion with primary team, and interview with family, facility staff/caregiver and/or Monica Downs.  I reviewed available labs, medications, imaging, studies and related documents from the EMR.  Records reviewed and summarized above.   ROS Full 14 system review of systems performed and negative with exception of: as per HPI.  Physical Exam: Constitutional: NAD General: frail appearing, thin, severely debilitated, non-verbal female EYES:  lids intact ENMT: oral mucous membranes moist CV: S1S2, RRR, no LE edema Pulmonary: decrease breath sounds, no increased work of breathing,  room air Abdomen: normo-active BS + 4 quadrants, soft and non tender GU: deferred MSK: muscle wasting;  Skin: warm and dry, no rashes or wounds on visible skin Neuro:  + generalized weakness,  + cognitive impairment Psych: flat affect, Alert, non-verbal,  Hem/lymph/immuno: no widespread bruising  Questions and concerns were addressed. The patient/family was encouraged to call with questions and/or concerns. My contact information was provided. Provided general support and encouragement, no other unmet needs identified  Thank you for the opportunity to participate in the care of Monica Downs. The palliative care team will continue to follow. Please call our office at [phone] if we can be of additional assistance.   This chart was dictated using voice recognition software. Despite best efforts to proofread, errors can occur which can change the documentation meaning.  Johnnette Laux Ihor Gully, NP , MSN,  [facility]

## 2021-02-05 ENCOUNTER — Other Ambulatory Visit: Payer: Self-pay

## 2021-02-05 ENCOUNTER — Encounter: Payer: Self-pay | Admitting: Nurse Practitioner

## 2021-02-05 ENCOUNTER — Non-Acute Institutional Stay: Payer: Medicare Other | Admitting: Nurse Practitioner

## 2021-02-05 DIAGNOSIS — R5381 Other malaise: Secondary | ICD-10-CM

## 2021-02-05 DIAGNOSIS — Z515 Encounter for palliative care: Secondary | ICD-10-CM

## 2021-02-05 DIAGNOSIS — E43 Unspecified severe protein-calorie malnutrition: Secondary | ICD-10-CM

## 2021-02-05 NOTE — Progress Notes (Signed)
[employer] Palliative Care Consult Note Telephone: [phone]  Fax: [phone]    Date of encounter: 02/05/21 PATIENT NAME: Monica Downs [id number]   [phone] (home)  DOB: May 20, 1941 MRN: [id number] PRIMARY CARE PROVIDER:   Dr [facility] RESPONSIBLE PARTY:    Contact Information    Name Relation Home Work Mobile   [place] Daughter  [phone]   Shelby Mattocks [phone]     Hubbard Robinson Brother   [phone]   Tew,Joyce Sister [phone]       I met face to face with patient and daughter, Jeannene Patella in facility. Palliative Care was asked to follow this patient by consultation request of  Dr Reesa Chew to address advance care planning and complex medical decision making. This is a follow up visit  ASSESSMENT AND PLAN / RECOMMENDATIONS:   Symptom Management/Plan: 1. ACP: DNR in vynca  2. Protein calorie malnutrition; slight improvement with weight. Daughter, Jeannene Patella continue to feed. Ongoing discussions of Hospice as Ms. Ferrufino continues to overall decline.  3. Debility,secondary to dementia, progressive, continue supportive role, monitoring; encourage oob as able  4.Palliative care encounter; Palliative care encounter; Palliative medicine team will continue to support patient, patient's family, and medical team. Visit consisted of counseling and education dealing with the complex and emotionally intense issues of symptom management and palliative care in the setting of serious and potentially life-threatening illness   Follow up Palliative Care Visit: Palliative care will continue to follow for complex medical decision making, advance care planning, and clarification of goals. Return 2 weeks or prn.  I spent 70 minutes providing this consultation. More than 50% of the time in this consultation was spent in counseling and care coordination.  PPS: 30%  HOSPICE  ELIGIBILITY/DIAGNOSIS: TBD  Chief Complaint: Follow-up Palliative consult for complex medical decision making  HISTORY OF PRESENT ILLNESS:  KARINNE SCHMADER is a 80 y.o. 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 AV fistula placement, Protein calorie malnutrition, history of covid. Seen in Emergency Department [facility] 12/16/2020 for a fall with severe dementia nonverbal. Superficial skin tears on left forearm, tetanus given. CT imaging shows chronic C1 fracture. Discharged back to facility. Hospitalized 01/23/2021 to 01/26/2021 for brief period of altered mental status as her baseline is bedbound, non verbal, total dependent care, incontinence but alert at baseline with a good appetite on a pureed diet do to dysphasia secondary to dementia. Work up consistent with possible pneumonia treated with doxycycline and [facility].staff endorsesMs Pamer does require assistance for transfers, mobility, bathing, dressing, toileting as she is incontinent bowel and bladder. Ms. Summons requires to be fed. When Ms. Mensing is fed, she will eat all her meal per [name], her daughter. Staff endorses no other changes. [name] called PC provider requesting visit today for f/u with ongoing discussion of goc. I visited and observed [name]. Ms. Hults did make eye contact, non-verbal. Ms. Noh appears comfortable. No visitors present. Ms. Metzner was cooperative with assessment. No meaningful discussion with cognitive impairment,  non-verbal. I called [name], Ms. Pomplun daughter. Clinical update discussed. We talked about  pending labs. Discussed chest xray was not warranted as clinically appears pna resolved. We talked about Ms. Weathers appetite. We talked about importance of Ms. Trinidad being fed. We talked about challenges in facility, encouraged [name] to continue open communication with [facility] staff if concerns. [name] in agreement. We talked about role PC in POC. Medical goals reviewed. Will continue to monitor, hold off on Hospice for now. I updated staff  History obtained from review of EMR, discussion with daughter, [name] interview  Staff/, observed  [name]. I reviewed available labs, medications, imaging, studies and related documents from the EMR.  Records reviewed and summarized above.   ROS Full 14 system review of systems performed and negative with exception of: as per HPI.  Physical Exam: Constitutional: NAD General: frail appearing, thin, severely debilitated, chronically ill female EYES: lids intact ENMT: oral mucous membranes moist CV: S1S2, RRR, no LE edema Pulmonary: decrease breath sounds throughout,  no increased work of breathing, room air Abdomen: , normo-active BS + 4 quadrants, soft and non tender GU: deferred MSK: functional quadriplegic; muscle wasting Skin: warm and dry, no rashes or wounds on visible skin Neuro:  Severe generalized weakness Psych: non-verbal, severely cognitively impaired  Questions and concerns were addressed. The patient/family was encouraged to call with questions and/or concerns. Provided general support and encouragement, no other unmet needs identified  Thank you for the opportunity to participate in the care of [name].  The palliative care team will continue to follow. Please call our office at [phone] if we can be of additional assistance.   This chart was dictated using voice recognition software. Despite best efforts to proofread, errors can occur which can  change the documentation meaning.   Daeshaun Specht Ihor Gully, NP , MSN,  [facility]

## 2021-03-05 ENCOUNTER — Other Ambulatory Visit: Payer: Self-pay

## 2021-03-05 ENCOUNTER — Encounter: Payer: Self-pay | Admitting: Nurse Practitioner

## 2021-03-05 ENCOUNTER — Non-Acute Institutional Stay: Payer: Medicare Other | Admitting: Nurse Practitioner

## 2021-03-05 VITALS — BP 102/62 | HR 69 | Temp 97.3°F | Resp 18 | Wt 96.5 lb

## 2021-03-05 DIAGNOSIS — R5381 Other malaise: Secondary | ICD-10-CM

## 2021-03-05 DIAGNOSIS — Z515 Encounter for palliative care: Secondary | ICD-10-CM

## 2021-03-05 DIAGNOSIS — E43 Unspecified severe protein-calorie malnutrition: Secondary | ICD-10-CM

## 2021-03-05 NOTE — Progress Notes (Signed)
[employer] Palliative Care Consult Note Telephone: [phone]  Fax: [phone]    Date of encounter: 03/05/21 PATIENT NAME: Monica Downs [id number]   [phone] (home)  DOB: March 21, 1941 MRN: [id number] PRIMARY CARE PROVIDER:   Dr Lucianne Lei [facility] RESPONSIBLE PARTY:    Contact Information    Name Relation Home Work [place] Daughter  [phone]   Monica Downs [phone]     Monica Downs Brother   [phone]   Monica Downs Sister [phone]       I met face to face with patient in facility. Palliative Care was asked to follow this patient by consultation request of  Dr Nona Dell  to address advance care planning and complex medical decision making. This is a follow up visit.  ASSESSMENT AND PLAN / RECOMMENDATIONS:  Symptom Management/Plan: 1. ACP: DNR in vynca  2.Protein calorie malnutrition; current weight 96.5 lb with 2.5 lb weight loss with BMI 18.2; continue on Regular diet, Level 4 - Pureed texture, Level 2 - Mildly Thick consistency with med plus supplement, assistance with feeding, monitoring weights.   3. Debility,secondary to dementia, progressive, continue supportive role, monitoring; encourage oob as able  4.Palliative care encounter; Palliative care encounter; Palliative medicine team will continue to support patient, patient's family, and medical team. Visit consisted of counseling and education dealing with the complex and emotionally intense issues of symptom management and palliative care in the setting of serious and potentially life-threatening illness.   Follow up Palliative Care Visit: Palliative care will continue to follow for complex medical decision making, advance care planning, and clarification of goals. Return 4 weeks or prn.  I spent 25 minutes providing this consultation. More than 50% of the time in this consultation was spent in  counseling and care coordination  PPS: 30%  HOSPICE ELIGIBILITY/DIAGNOSIS: TBD  Chief Complaint: Palliative consult follow up for complex medical decision making  HISTORY OF PRESENT ILLNESS:  Monica Downs is a 80 y.o. 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 AV fistula placement, Protein calorie malnutrition, history of covid.Seen in Emergency Department [facility] 12/16/2020 for a fall with severe dementia nonverbal. Superficial skin tears on left forearm, tetanus given. CT imaging shows chronic C1 fracture. Discharged back to facility. Hospitalized 01/23/2021 to 01/26/2021 for brief period of altered mental status as her baseline is bedbound, non verbal, total dependent care, incontinence but alert at baseline with a good appetite on a pureed diet do to dysphasia secondary to dementia. Work up consistent with possible pneumonia treated with doxycycline and returned back [facility].Staff endorsesMs Downs does require assistance for transfers, mobility, bathing, dressing, toileting as she is incontinentbowel and bladder. Monica Downs has been requiring to be fed with appetite varied fair. Current weight 96.5 lb with 2.5 lb weight loss with BMI 18.2; continue on Regular diet, Level 4 - Pureed texture, Level 2 - Mildly Thick consistency with med plus supplement. Repeat cxr clear. Staff endorses no other changes. At present, Monica Downs is lying in bed, appears debilitated, chronically ill, thin with flat affect, non-verbal.  Appears comfortable. I  visited and observed Monica Downs. Monica Downs did make eye contact, cooperative with assessment, no meaning discussion with cognitive impairment. Emotional support provided. Attempted to contact daughter for update, will continue to try. No new changes recommended, will continue to watch weights, disease progression. Medical goals reviewed. No new changes to goc. Updated staff.   History obtained from review of EMR, discussion with primary team, and interview with family, facility staff/caregiver and/or Monica Downs.  I reviewed available labs, medications, imaging, studies and related documents from the EMR.  Records reviewed and summarized above.   ROS Full 14 system review of systems performed and negative with exception of: as per HPI.  Physical Exam: Constitutional: NAD General: frail appearing, thin debilitated, cognitively impaired female EYES: lids intact ENMT:  oral mucous membranes moist CV: S1S2, RRR, no LE edema Pulmonary: decrease breath sounds, no increased work of breathing, no cough, room air Abdomen: normo-active BS + 4 quadrants, soft and non tender MSK: muscle wasting Skin: warm and dry, no rashes or wounds on visible skin Neuro:  + generalized weakness, functional quadriplegic Psych: flat affect, cognitively impaired  Questions and concerns were addressed. Provided general support and encouragement, no other unmet needs identified  Thank you for the opportunity to participate in the care of Monica Downs.  The palliative care team will continue to follow. Please call our office at [phone] if we can be of additional assistance.   This chart was dictated using voice recognition software. Despite best efforts to proofread, errors can occur which can change the documentation meaning.   Shamaria Kavan Ihor Gully, NP , DNP, MSN, [facility]

## 2021-03-22 ENCOUNTER — Non-Acute Institutional Stay: Payer: Medicare Other | Admitting: Nurse Practitioner

## 2021-03-22 ENCOUNTER — Encounter: Payer: Self-pay | Admitting: Nurse Practitioner

## 2021-03-22 ENCOUNTER — Other Ambulatory Visit: Payer: Self-pay

## 2021-03-22 VITALS — BP 101/72 | HR 88 | Temp 97.2°F | Resp 18 | Wt 88.0 lb

## 2021-03-22 DIAGNOSIS — E43 Unspecified severe protein-calorie malnutrition: Secondary | ICD-10-CM

## 2021-03-22 DIAGNOSIS — Z515 Encounter for palliative care: Secondary | ICD-10-CM

## 2021-03-22 DIAGNOSIS — R5381 Other malaise: Secondary | ICD-10-CM

## 2021-03-22 NOTE — Progress Notes (Signed)
[employer] Palliative Care Consult Note Telephone: [phone]  Fax: [phone]    Date of encounter: 03/22/21 PATIENT NAME: Monica Downs [id number]   [phone] (home)  DOB: 02-17-1941 MRN: [id number] PRIMARY CARE PROVIDER:   Dr Lucianne Lei [facility] [address] [place], [place],  [place] [address] [phone]  RESPONSIBLE PARTY:    Contact Information     Name Relation Home Work [place] Daughter  [phone]   Shelby Mattocks [phone]     Hubbard Robinson Brother   [phone]   Tew,Joyce Sister [phone]        I met face to face with patient in facility. Palliative Care was asked to follow this patient by consultation request of  Dr Nona Dell to address advance care planning and complex medical decision making. This is a follow up visit.  ASSESSMENT AND PLAN / RECOMMENDATIONS:   Symptom Management/Plan: 1. ACP: DNR in vynca   2. Protein calorie malnutrition; current weight 96.5 lb with 2.5 lb weight loss with BMI 18.2; continue on Regular diet, Level 4 - Pureed texture, Level 2 - Mildly Thick consistency with med plus supplement, assistance with feeding, monitoring weights.    3. Debility, secondary to dementia, progressive, continue supportive role, monitoring; encourage oob as able   4. Palliative care encounter; Palliative care encounter; Palliative medicine team will continue to support patient, patient's family, and medical team. Visit consisted of counseling and education dealing with the complex and emotionally intense issues of symptom management and palliative care in the setting of serious and potentially life-threatening illness.  Follow up Palliative Care Visit: Palliative care will continue to follow for complex medical decision making, advance care planning, and clarification of goals. Return 4 weeks or prn.  I spent 65  minutes providing this consultation. More than 50% of the time in this consultation was spent in counseling and care coordination.  PPS: 30%  HOSPICE ELIGIBILITY/DIAGNOSIS: TBD  Chief Complaint: Follow up Palliative consult for complex medical decision making  HISTORY OF PRESENT ILLNESS:  Monica Downs is a 80 y.o. 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 AV fistula placement, Protein calorie malnutrition, history of covid. Seen in Emergency Department [facility] 12/16/2020 for a fall with severe dementia nonverbal. Superficial skin tears on left forearm, tetanus given. CT imaging shows chronic C1 fracture. Discharged back to facility. Hospitalized 01/23/2021 to 01/26/2021 for brief period of altered mental status as her baseline is bedbound, non verbal, total dependent care, incontinence but alert at baseline with a good appetite on a pureed diet do to dysphasia secondary to dementia. Work up consistent with possible pneumonia treated with doxycycline and returned back to facility where she currently is at [facility]. Staff endorses Monica Downs does require assistance for transfers, mobility, bathing, dressing, toileting as she is incontinent bowel and bladder. Monica Downs has been requiring to be fed with appetite varied fair. Current on Regular diet, Level 4 - Pureed texture, Level 2 - Mildly Thick consistency with med plus supplement. Repeat cxr clear. Staff endorses no other changes.  At present Monica Downs is lying  in bed. Monica Downs does make eye contact and  grunting verbal sounds. Monica Downs does appear comfortable though severely debilitated. No visitors present period I visited and observed Monica Downs. no meaningful discussion is nonverbal, cognative impairment. Monica Downs was cooperative with assessment. Emotional support provided.I called Monica Downs's daughter Jeannene Patella for clinical update. Monica Downs and I talked about Palliative visit with Monica Downs. We talked about the new fungus on her feet. We talked about weights, appetite. We talked about weight loss, nutrition in current diet. We talked about Monica Downs getting out of bed being lifted to a recliner. We talked about medical goals of care including aggressive versus comfort. We talked about option of Hospice services through Medicare benefit. We talked about role palliative care and plan of care. Monica Downs wishes to continue with palliative at present time. Therapeutic listening and emotional support provided. No new recommendations at this time. Will continue to monitor and follow, Monica Downs in agreement. I updated nursing staff  01/26/2021 weight 99 lbs 02/13/2021 weight 96.5 lbs 03/21/2021 weight 88 lbs BMI 16.6  03/06/2021 wbc 5.5, hemoglobin 11.5, hct 33, platelets 268, sodium 132, potassium 3.9, chloride 93, co2 26. bun 9.6, creatinine 0.38, glucose 80, albumin 3.8, total protein 6.4, ast 33, alt    History obtained from review of EMR, discussion with daughter, facility staff and Monica Downs.  I reviewed available labs, medications, imaging, studies and related documents from the EMR.  Records reviewed and summarized above.   ROS Full 14 system review of systems performed and negative with exception of: as per HPI.   Physical Exam: Constitutional: NAD General: frail appearing, thin, severely debilitated confused female EYES:  lids intact ENMT: oral mucous membranes moist CV: S1S2, RRR, no LE edema Pulmonary: poor air movement, decrease bases, no increased work of breathing, Abdomen:  normo-active BS + 4 quadrants,  soft and non tender MSK: bed bound; muscle wasting with atrophy Skin: warm and dry, no rashes or wounds on visible skin Neuro:  + generalized weakness, Psych: flat affect, Alert, confused  Questions and concerns were addressed. The daughter/facility staff was encouraged to call with questions and/or concerns. My contact information was provided. Provided general support and encouragement, no other unmet needs identified   Thank you for the opportunity to participate in the care of Monica Downs.  The palliative care team will continue to follow. Please call our office at [phone] if we can be of additional assistance.   This chart was dictated using voice recognition software.  Despite best efforts to proofread,  errors can occur which can change the documentation meaning.   Cozette Braggs Ihor Gully, NP , MSN,  [facility]

## 2021-05-17 ENCOUNTER — Non-Acute Institutional Stay: Payer: Medicare Other | Admitting: Nurse Practitioner

## 2021-05-17 ENCOUNTER — Encounter: Payer: Self-pay | Admitting: Nurse Practitioner

## 2021-05-17 VITALS — BP 102/78 | HR 58 | Temp 98.1°F | Resp 18 | Wt 91.0 lb

## 2021-05-17 DIAGNOSIS — R5381 Other malaise: Secondary | ICD-10-CM

## 2021-05-17 DIAGNOSIS — E43 Unspecified severe protein-calorie malnutrition: Secondary | ICD-10-CM

## 2021-05-17 DIAGNOSIS — Z515 Encounter for palliative care: Secondary | ICD-10-CM

## 2021-05-17 NOTE — Progress Notes (Signed)
[employer] Palliative Care Consult Note Telephone: [phone]  Fax: [phone]    Date of encounter: 05/17/21 PATIENT NAME: Monica Downs [id number]   [phone] (home)  DOB: 18-Aug-1941 MRN: [id number] PRIMARY CARE PROVIDER:    Dr Lucianne Lei [facility]  RESPONSIBLE PARTY:    Contact Information     Name Relation Home Work [place] Daughter  [phone]   Monica Downs [phone]     Monica Downs Brother   [phone]   Monica Downs Sister [phone]        I met face to face with patient in facility. Palliative Care was asked to follow this patient by consultation request of  Dr Nona Dell to address advance care planning and complex medical decision making. This is a follow up visit.                                  ASSESSMENT AND PLAN / RECOMMENDATIONS:  Symptom Management/Plan: 1. ACP: DNR in vynca   2. Protein calorie malnutrition; current weight 91 lbs with BMI 17.2; continue on Regular diet, Level 4 - Pureed texture, Level 2 - Mildly Thick consistency with med plus supplement, assistance with feeding, monitoring weights.    3. Debility, secondary to dementia, progressive, continue supportive role, monitoring; encourage oob as able   4. Palliative care encounter; Palliative care encounter; Palliative medicine team will continue to support patient, patient's family, and medical team. Visit consisted of counseling and education dealing with the complex and emotionally intense issues of symptom management and palliative care in the setting of serious and potentially life-threatening illness.    Follow up Palliative Care Visit: Palliative care will continue to follow for complex medical decision making, advance care planning, and clarification of goals. Return 4 weeks or prn.  I spent 36 minutes providing this consultation. More than 50% of the time in this consultation was  spent in counseling and care coordination.  PPS: 30%  Chief Complaint: Follow up palliative consult for complex medical decision making  HISTORY OF PRESENT ILLNESS:  Monica Downs is a 80 y.o. 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 AV fistula placement, Protein calorie malnutrition, history of covid. Seen in Emergency Department [facility] 12/16/2020 for a fall with severe dementia nonverbal. Superficial skin tears on left forearm, tetanus given. CT imaging shows chronic C1 fracture. Discharged back to facility. Hospitalized 01/23/2021 to 01/26/2021 for brief period of altered mental status as her baseline is bedbound, non verbal, total dependent care, incontinence but alert at baseline with a good appetite on a pureed diet do to dysphasia secondary to dementia. Work up consistent with possible pneumonia treated with doxycycline and returned back to facility where she currently is at [facility]. Staff endorses [name] does require assistance for transfers, mobility, bathing, dressing, toileting as she is incontinent bowel and bladder. [name] has been requiring to be fed with appetite varied fair. Current weight 91 lbs  BMI 17.2; continue on Regular diet, Level 4 - Pureed texture, Level 2 - Mildly Thick consistency  with med plus supplement. Staff endorses no other changes or concerns. [name] has had no recent falls, wounds, hospitalizations, infections per staff. Ms. Warrant daughter continues to come to the facility, feeds [name] dinner. At present,  [name] is lying in bed, with her eyes open. [name] does make eye contact, mumbles sounds, like word salad. [name] continue to appear debilitated, ongoing decline with muscle wasting. Emotional support provided. I have attempted to reach daughter, will continue. Will revisit hospice as she is hospice eligible though daughter wished to wait as she was not ready for hospice. I updated staff, will continue to follow monthly.  History obtained from review of EMR, discussion with facility staff and  [name].  I reviewed available labs, medications, imaging, studies and related documents from the EMR.  Records reviewed and summarized above.   ROS Full 14 system review of systems performed and negative with exception of: as per HPI.   Physical Exam: Constitutional: NAD General: frail appearing, thin, confused female EYES: lids intact ENMT: oral mucous membranes moist CV: S1S2, RRR, Pulmonary: decrease bases, no increased work of breathing, no cough, room air Abdomen:  normo-active BS + 4 quadrants, soft and non tender MSK: functionally quadriplegic; muscle wasting Skin: warm and dry Neuro:  + generalized weakness,  + cognitive impairment Psych: flat affect, Alert, confused  Questions and concerns were addressed. Provided general support and encouragement, no other unmet needs identified   Thank you for the opportunity to participate in the care of [name].  The palliative care team will continue to follow. Please call our office at [phone] if we can be of additional assistance.   This chart was dictated using voice recognition software.  Despite best efforts to proofread,  errors can occur which can change the documentation meaning.    Z , NP     

## 2021-05-18 ENCOUNTER — Telehealth: Payer: Self-pay | Admitting: Nurse Practitioner

## 2021-05-18 ENCOUNTER — Other Ambulatory Visit: Payer: Self-pay

## 2021-05-18 NOTE — Telephone Encounter (Signed)
I attempted to call [name], Ms. Brugger's daughter to update on pc visit, no answer, message left with contact information

## 2021-05-21 ENCOUNTER — Encounter: Payer: Self-pay | Admitting: Nurse Practitioner

## 2021-05-21 ENCOUNTER — Non-Acute Institutional Stay: Payer: Medicare Other | Admitting: Nurse Practitioner

## 2021-05-21 VITALS — BP 102/78 | HR 56 | Temp 98.1°F | Resp 18 | Wt 91.0 lb

## 2021-05-21 DIAGNOSIS — E43 Unspecified severe protein-calorie malnutrition: Secondary | ICD-10-CM

## 2021-05-21 DIAGNOSIS — Z515 Encounter for palliative care: Secondary | ICD-10-CM

## 2021-05-21 DIAGNOSIS — R5381 Other malaise: Secondary | ICD-10-CM

## 2021-05-21 NOTE — Progress Notes (Signed)
[facility] Pall[facility] Consult Note Telephone: [phone]  Fax: [phone]    Date of encounter: 05/21/21 7:09 PM PATIENT NAME: Monica Downs [id number]   [phone] (home)  DOB: 07-21-1941 MRN: [id number] PRIMARY CARE PROVIDER:    Dr Lucianne Lei [facility]  RESPONSIBLE PARTY:    Contact Information     Name Relation Home Work [place] Daughter  [phone]   Monica Downs [phone]     Monica Downs Brother   [phone]   Monica Downs Sister [phone]        I met face to face with patient  in facility. Palliative Care was asked to follow this patient by consultation request of  Dr Nona Dell to address advance care planning and complex medical decision making. This is a follow up visit  ASSESSMENT AND PLAN / RECOMMENDATIONS:   Symptom Management/Plan: 1. ACP: DNR in vynca   2. Protein calorie malnutrition; current weight 91 lbs with 3lbs weight gain continue on Regular diet, Level 4 - Pureed texture, Level 2 - Mildly Thick consistency with med plus supplement, assistance with feeding, monitoring weights.    3. Debility, secondary to dementia, progressive, continue supportive role, monitoring; encourage oob as able   4. Palliative care encounter; Palliative care encounter; Palliative medicine team will continue to support patient, patient's family, and medical team. Visit consisted of counseling and education dealing with the complex and emotionally intense issues of symptom management and palliative care in the setting of serious and potentially life-threatening illness.  Follow up Palliative Care Visit: Palliative care will continue to follow for complex medical decision making, advance care planning, and clarification of goals. Return 4 weeks or prn.  I spent 40 minutes providing this consultation. More than 50% of the time in this consultation was spent in  counseling and care coordination.  PPS: 30%  Chief Complaint: Follow up palliative consult for complex medical decision making  HISTORY OF PRESENT ILLNESS:  Monica Downs is a 80 y.o. 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 AV fistula placement, Protein calorie malnutrition, history of covid. Seen in Emergency Department [facility] 12/16/2020 for a fall with severe dementia nonverbal. Superficial skin tears on left forearm, tetanus given. CT imaging shows chronic C1 fracture. Discharged back to facility. Hospitalized 01/23/2021 to 01/26/2021 for brief period of altered mental status as her baseline is bedbound, non verbal, total dependent care, incontinence but alert at baseline with a good appetite on a pureed diet do to dysphasia secondary to dementia. Work up consistent with possible pneumonia treated with doxycycline and returned back to facility where she currently is at [facility]. Staff endorses Monica Downs does require assistance for transfers, mobility, bathing, dressing, toileting as she is incontinent bowel and bladder. Monica Downs has been requiring to be fed with appetite varied fair. 91lbs. 05/20/2021 careplan meeting held, updated. 91 lbs with 3lbs weight gain. Monica Downs, Monica Downs daughter called pc prior to visit, requested PC for f/u visit for toe nail fungus with area on left toe wanted to have looked at. Discussed with Monica Downs, will assess, though palliative focus on goc, symptoms  will defer to primary. At present, Monica Downs is lying in bed, appears thin,  debilitated with muscle wasting, confused. Monica Downs appears comfortable, no distress. Monica Downs did make eye contact, non-verbal. Monica Downs cooperative with assessment. Emotional support provided. I did attempt to contact Monica Downs, for update. Will ask facility to place on podiatry/wound list. I updated staff.   History obtained from review of EMR, discussion with facility staff and Monica Downs.  I reviewed available labs, medications, imaging, studies and related documents from the EMR.  Records reviewed and summarized above.   ROS Full 14 system review of systems performed and negative with exception of: as per HPI.   Physical Exam: Constitutional: NAD General: frail appearing, thin, debilitated, pale, confused female EYES: lids intact ENMT: oral mucous membranes moist CV: S1S2, RRR Pulmonary: decrease bases, no increased work of breathing, no cough, room air Abdomen: oft and non tender MSK: bedbound; muscle wasting Skin: warm and dry Neuro:  + generalized weakness,  + cognitive impairment Psych: flat affect, Alert  Questions and concerns were addressed. Provided general support and encouragement, no other unmet needs identified   Thank you for the opportunity to participate in the care of Monica Downs.  The palliative care team will continue to follow. Please call our office at [phone] if we can be of additional assistance.   This chart was dictated using voice recognition software.  Despite best efforts to proofread,  errors can occur which can change the documentation meaning.   Monica Lucien Ihor Gully, NP

## 2021-05-24 ENCOUNTER — Other Ambulatory Visit: Payer: Self-pay

## 2021-05-24 ENCOUNTER — Telehealth: Payer: Self-pay | Admitting: Nurse Practitioner

## 2021-05-24 NOTE — Telephone Encounter (Signed)
I called Monica Downs daughter, [name]. Updated discussed about wound, toe fungus, pc visit, no new changes to poc  Total time spent 17 minutes Phone discussion 7 minutes Documentation 5 minutes

## 2021-06-16 ENCOUNTER — Emergency Department: Payer: Medicare Other

## 2021-06-16 ENCOUNTER — Emergency Department
Admission: EM | Admit: 2021-06-16 | Discharge: 2021-06-16 | Disposition: A | Discharge status: Home / Self Care | Payer: Medicare Other | Attending: Emergency Medicine | Admitting: Emergency Medicine

## 2021-06-16 ENCOUNTER — Other Ambulatory Visit: Payer: Self-pay

## 2021-06-16 DIAGNOSIS — Z96652 Presence of left artificial knee joint: Secondary | ICD-10-CM | POA: Diagnosis not present

## 2021-06-16 DIAGNOSIS — Z7982 Long term (current) use of aspirin: Secondary | ICD-10-CM | POA: Diagnosis not present

## 2021-06-16 DIAGNOSIS — Z87891 Personal history of nicotine dependence: Secondary | ICD-10-CM | POA: Diagnosis not present

## 2021-06-16 DIAGNOSIS — F0391 Unspecified dementia with behavioral disturbance: Secondary | ICD-10-CM | POA: Insufficient documentation

## 2021-06-16 DIAGNOSIS — I10 Essential (primary) hypertension: Secondary | ICD-10-CM | POA: Diagnosis not present

## 2021-06-16 DIAGNOSIS — E039 Hypothyroidism, unspecified: Secondary | ICD-10-CM | POA: Diagnosis not present

## 2021-06-16 DIAGNOSIS — Z955 Presence of coronary angioplasty implant and graft: Secondary | ICD-10-CM | POA: Diagnosis not present

## 2021-06-16 DIAGNOSIS — Z79899 Other long term (current) drug therapy: Secondary | ICD-10-CM | POA: Diagnosis not present

## 2021-06-16 DIAGNOSIS — I2510 Atherosclerotic heart disease of native coronary artery without angina pectoris: Secondary | ICD-10-CM | POA: Diagnosis not present

## 2021-06-16 DIAGNOSIS — Z8616 Personal history of COVID-19: Secondary | ICD-10-CM | POA: Diagnosis not present

## 2021-06-16 DIAGNOSIS — R569 Unspecified convulsions: Secondary | ICD-10-CM | POA: Insufficient documentation

## 2021-06-16 LAB — COMPREHENSIVE METABOLIC PANEL
ALT: 23 U/L (ref 0–44)
AST: 46 U/L — ABNORMAL HIGH (ref 15–41)
Albumin: 3.5 g/dL (ref 3.5–5.0)
Alkaline Phosphatase: 55 U/L (ref 38–126)
Anion gap: 10 (ref 5–15)
BUN: 12 mg/dL (ref 8–23)
CO2: 27 mmol/L (ref 22–32)
Calcium: 7.8 mg/dL — ABNORMAL LOW (ref 8.9–10.3)
Chloride: 91 mmol/L — ABNORMAL LOW (ref 98–111)
Creatinine, Ser: 0.4 mg/dL — ABNORMAL LOW (ref 0.44–1.00)
GFR, Estimated: >60 mL/min (ref >60)
Glucose, Bld: 181 mg/dL — ABNORMAL HIGH (ref 70–99)
Potassium: 4 mmol/L (ref 3.5–5.1)
Sodium: 128 mmol/L — ABNORMAL LOW (ref 135–145)
Total Bilirubin: 1 mg/dL (ref 0.3–1.2)
Total Protein: 7.1 g/dL (ref 6.5–8.1)

## 2021-06-16 LAB — CBC WITH DIFFERENTIAL/PLATELET
Abs Immature Granulocytes: 0.16 10*3/uL — ABNORMAL HIGH (ref 0.00–0.07)
Basophils Absolute: 0.1 10*3/uL (ref 0.0–0.1)
Basophils Relative: 1 %
Eosinophils Absolute: 0.1 10*3/uL (ref 0.0–0.5)
Eosinophils Relative: 1 %
HCT: 34 % — ABNORMAL LOW (ref 36.0–46.0)
Hemoglobin: 11.6 g/dL — ABNORMAL LOW (ref 12.0–15.0)
Immature Granulocytes: 1 %
Lymphocytes Relative: 15 %
Lymphs Abs: 1.6 10*3/uL (ref 0.7–4.0)
MCH: 31.5 pg (ref 26.0–34.0)
MCHC: 34.1 g/dL (ref 30.0–36.0)
MCV: 92.4 fL (ref 80.0–100.0)
Monocytes Absolute: 0.8 10*3/uL (ref 0.1–1.0)
Monocytes Relative: 7 %
Neutro Abs: 8.4 10*3/uL — ABNORMAL HIGH (ref 1.7–7.7)
Neutrophils Relative %: 75 %
Platelets: 255 10*3/uL (ref 150–400)
RBC: 3.68 MIL/uL — ABNORMAL LOW (ref 3.87–5.11)
RDW: 13.8 % (ref 11.5–15.5)
WBC: 11.1 10*3/uL — ABNORMAL HIGH (ref 4.0–10.5)
nRBC: 0 % (ref 0.0–0.2)

## 2021-06-16 LAB — LACTIC ACID, PLASMA: Lactic Acid, Venous: 1.9 mmol/L (ref 0.5–1.9)

## 2021-06-16 LAB — TROPONIN I (HIGH SENSITIVITY): Troponin I (High Sensitivity): 26 ng/L — ABNORMAL HIGH (ref <18)

## 2021-06-16 MED ORDER — DOXYCYCLINE HYCLATE 50 MG PO CAPS: 100.0000 mg | ORAL_CAPSULE | Freq: Two times a day (BID) | ORAL | 0 refills | Status: AC

## 2021-06-16 NOTE — ED Notes (Signed)
Called [facility] for transport to [facility]  [phone]

## 2021-06-16 NOTE — ED Notes (Signed)
Light green, blue, red, lavender tubes of blood sent to lab

## 2021-06-16 NOTE — ED Provider Notes (Signed)
[facility] Emergency Department Provider Note   ____________________________________________   Event Date/Time   First MD Initiated Contact with Patient 06/16/21 1256     (approximate)  I have reviewed the triage vital signs and the nursing notes.   HISTORY  Chief Complaint Seizures (Pt BIB EMS from [facility] for new onset seizure activity. Pt had 2 seizures lasting approx 30 seconds about 1 minute apart; no post-ictal behavior, no loss of bowel or bladder control. No seizure hx. Hx TBI. No recent falls. Per facility, pt is at baseline currently.)    HPI Monica Downs is a 80 y.o. female with a past medical history of TBI who presents via EMS after new onset seizure activity.  Patient is nonverbal at baseline and patient's daughter is at bedside to provide history.  States that patient is a long-term care facility told her that she had 2 separate episodes of seizure-like activity in which her upper and lower extremities were shaking, her eyes were closed, and lasted approximately 30 seconds each.  There was no postictal period for her to come back to baseline however she is very minimally responsive at baseline.  Denies any bowel/bladder incontinence or intraoral trauma.  Per daughter, patient has no history of seizure activity and takes no antiepileptic drugs however does have history of traumatic brain injury.          Past Medical History:  Diagnosis Date   Allergic rhinitis    Anxiety    CAD (coronary artery disease)    a. 07/2007 Cath/PCI: LM nl, LAD 99p (3.0 x 18 Xience EES), D1 90ost, LCX nl, RCA nl;  b. 09/2014 MV: EF 72%, attenuation artifact, no ischemia.   Clotting disorder (HCC)    LEGS;throat   Falls    History of subarachnoid hemorrhage    a. 04/2016 In setting of fall-->traumatic brain injury.   HLD (hyperlipidemia)    Hypertension    Hypothyroidism    Incontinence    Left Forearm AV Fistula    a. Asymptomatic.  No blood  draws/IV's;  b. 06/2013 confirmed by L FA duplex.   Mitral regurgitation    a. 02/2011 Echo: EF 72%, trace TR, mild MR/AI; b. 01/2018 Echo: EF 60-65%, no rwma, Gr2 DD, mild AI/MR. Nl RV fxn. Mildly dil RV/RA. Mod TR. Nl PASP.   Obstructive sleep apnea    Panic attacks    Traumatic brain injury (HCC)    a. 04/2016 in setting of fall/SAH.    Patient Active Problem List   Diagnosis Date Noted   Protein-calorie malnutrition, severe 01/25/2021   Pneumonia 01/24/2021   TBI (traumatic brain injury) (HCC) 01/23/2021   Hyponatremia 01/23/2021   History of subarachnoid hemorrhage 01/23/2021   Dysphagia 01/23/2021   Dyspnea 01/23/2021   Underweight 01/23/2021   Altered mental status 09/04/2019   Acute cystitis with hematuria    Nondisplaced posterior arch fracture of first cervical vertebra with routine healing    COVID-19    Fall    Hypokalemia 09/03/2019   Acute lower UTI 09/03/2019   AMS (altered mental status) 09/03/2019   Elevated troponin 09/03/2019   Unspecified dementia with behavioral disturbance (HCC) 01/02/2019   Advanced dementia (HCC) 12/30/2018   Essential hypertension 01/26/2018   History of TIA (transient ischemic attack) 01/26/2018   Traumatic subarachnoid hemorrhage (HCC) 04/11/2016   Hypothyroidism, unspecified 01/15/2016   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 Authorizing Provider  doxycycline (VIBRAMYCIN) 50  MG capsule Take 2 capsules (100 mg total) by mouth 2 (two) times daily for 5 days. 06/16/21 06/21/21 Yes Merwyn Katos, MD  aspirin 81 MG tablet Take 81 mg by mouth daily.     [provider]  atorvastatin (LIPITOR) 40 MG tablet TAKE ONE TABLET BY MOUTH EVERY DAY 12/14/18   Iran Ouch, MD  Azelastine HCl 137 MCG/SPRAY SOLN Place 1 spray into both nostrils 2 (two) times daily.    [provider]  Cholecalciferol (VITAMIN D3) 1000 units CAPS Take 1,000 Units by mouth daily.     [provider]  ciclopirox (LOPROX) 0.77 % cream Apply topically. Apply to toenails topically every day shift for fungal infection for 6 months 01/12/21   [provider]  denosumab (PROLIA) 60 MG/ML SOLN injection Inject 60 mg into the skin every 6 (six) months. Administer in upper arm, thigh, or abdomen    [provider]  donepezil (ARICEPT) 10 MG tablet Take 10 mg by mouth daily.     [provider]  Ensure (ENSURE) Take 237 mLs by mouth with breakfast, with lunch, and with evening meal. 01/26/21   Charise Killian, MD  ferrous sulfate 325 (65 FE) MG tablet Take 325 mg by mouth daily with breakfast.    [provider]  fluticasone (FLONASE) 50 MCG/ACT nasal spray Place 1 spray into both nostrils daily as needed for allergies or rhinitis (congestion).    [provider]  levothyroxine (SYNTHROID, LEVOTHROID) 75 MCG tablet Take 75 mcg by mouth daily before breakfast.  01/08/16   [provider]  Multiple Vitamin (MULTIVITAMIN WITH MINERALS) TABS tablet Take 1 tablet by mouth daily. 09/06/19   Enedina Finner, MD  mupirocin ointment (BACTROBAN) 2 % Apply 1 application topically 2 (two) times daily. (apply to skin tears)  Patient not taking: Reported on 01/24/2021    [provider]  sertraline (ZOLOFT) 25 MG tablet Take 25 mg by mouth daily.  08/11/20   [provider]  traZODone (DESYREL) 50 MG tablet Take 50 mg by mouth at  bedtime.    [provider]    Allergies Azithromycin, Hydrocodone-acetaminophen, Mobic [meloxicam], Ondansetron hcl, Penicillins, Percocet [oxycodone-acetaminophen], Reclast [zoledronic acid], Vicodin [hydrocodone-acetaminophen], Codeine, Ibandronic acid, Oxycodone, and Zofran [ondansetron]  Family History  Problem Relation Age of Onset   Heart disease Mother    Hyperlipidemia Mother    Tuberculosis Mother    Colon cancer Father    Colon cancer Other    Lymphoma Other    Heart disease Other    Breast cancer Neg Hx     Social History Social History   Tobacco Use   Smoking status: Former    Packs/day: 0.10    Years: 2.00    Pack years: 0.20    Types: Cigarettes    Quit date: 10/04/1991    Years since quitting:  29.7   Smokeless tobacco: Never   Tobacco comments:    smoked on and off  Substance Use Topics   Alcohol use: No   Drug use: No    Review of Systems Unable to assess secondary mental status ____________________________________________   PHYSICAL EXAM:  VITAL SIGNS: ED Triage Vitals  Enc Vitals Group     BP 06/16/21 1311 (!) 146/107     Pulse Rate 06/16/21 1315 71     Resp 06/16/21 1311 16     Temp 06/16/21 1311 (!) 97.5 F (36.4 C)     Temp Source 06/16/21 1311 Axillary     SpO2 06/16/21 1311 97 %     Weight 06/16/21 1312 91 lb 0.8 oz (41.3 kg)     Height 06/16/21 1312 5\' 5"  (1.651 m)     Head Circumference --      Peak Flow --      Pain Score --      Pain Loc --      Pain Edu? --      Excl. in GC? --    Constitutional: Alert and nonverbal. Well appearing and in no acute distress. Eyes: Conjunctivae are normal. PERRL. Head: Atraumatic. Nose: No congestion/rhinnorhea. Mouth/Throat: Mucous membranes are moist. Neck: No stridor Cardiovascular: Grossly normal heart sounds.  Good peripheral circulation. Respiratory: Normal respiratory effort.  No retractions. Gastrointestinal: Soft and nontender. No distention. Musculoskeletal: No obvious  deformities Neurologic:  No gross focal neurologic deficits are appreciated. Skin:  Skin is warm and dry. No rash noted. Psychiatric: Cooperative  ____________________________________________   LABS (all labs ordered are listed, but only abnormal results are displayed)  Labs Reviewed  COMPREHENSIVE METABOLIC PANEL - Abnormal; Notable for the following components:      Result Value   Sodium 128 (*)    Chloride 91 (*)    Glucose, Bld 181 (*)    Creatinine, Ser 0.40 (*)    Calcium 7.8 (*)    AST 46 (*)    All other components within normal limits  CBC WITH DIFFERENTIAL/PLATELET - Abnormal; Notable for the following components:   WBC 11.1 (*)    RBC 3.68 (*)    Hemoglobin 11.6 (*)    HCT 34.0 (*)    Neutro Abs 8.4 (*)    Abs Immature Granulocytes 0.16 (*)    All other components within normal limits  TROPONIN I (HIGH SENSITIVITY) - Abnormal; Notable for the following components:   Troponin I (High Sensitivity) 26 (*)    All other components within normal limits  LACTIC ACID, PLASMA  URINALYSIS, COMPLETE (UACMP) WITH MICROSCOPIC  LACTIC ACID, PLASMA  TROPONIN I (HIGH SENSITIVITY)    RADIOLOGY  ED MD interpretation: CT of the head without contrast shows no evidence of acute abnormalities including no intracerebral hemorrhage, obvious masses, or significant edema  Official radiology report(s): CT Head Wo Contrast  Result Date: 06/16/2021 CLINICAL DATA:  Seizure EXAM: CT HEAD WITHOUT CONTRAST TECHNIQUE: Contiguous axial images were obtained from the base of the skull through the vertex without intravenous contrast. COMPARISON:  01/23/2021 FINDINGS: Brain: Stable hypodensities are seen throughout the periventricular white matter and bilateral basal ganglia, consistent with chronic small vessel ischemic change. No acute infarct or hemorrhage. Lateral ventricles and midline structures are unremarkable. No acute extra-axial fluid collections. No mass effect. Vascular: No hyperdense  vessel or unexpected calcification. Skull: Normal. Negative for fracture or focal lesion. Sinuses/Orbits: Chronic left mastoid effusion. Minimal fluid left maxillary sinus. Remaining sinuses are clear. Other: None.  IMPRESSION: 1. Stable chronic small vessel ischemic changes. No acute intracranial process. Electronically Signed   By: Sharlet Salina M.D.   On: 06/16/2021 15:03    ____________________________________________   PROCEDURES  Procedure(s) performed (including Critical Care):  .1-3 Lead EKG Interpretation Performed by: Merwyn Katos, MD Authorized by: Merwyn Katos, MD     Interpretation: normal     ECG rate:  71   ECG rate assessment: normal     Rhythm: sinus rhythm     Ectopy: none     Conduction: normal     ____________________________________________   INITIAL IMPRESSION / ASSESSMENT AND PLAN / ED COURSE  As part of my medical decision making, I reviewed the following data within the electronic medical record, if available:  Nursing notes reviewed and incorporated, Labs reviewed, EKG interpreted, Old chart reviewed, Radiograph reviewed and Notes from prior ED visits reviewed and incorporated        Patient presents with complaints of syncope/presyncope as patient had very little postictal symptoms, bowel/bladder incontinence, no tongue trauma, and normal lactate ED Workup:  CBC, BMP, Troponin, BNP, ECG, CXR Differential diagnosis includes HF, ICH, seizure, stroke, HOCM, ACS, aortic dissection, malignant arrhythmia, or GI bleed. Findings: No evidence of acute laboratory abnormalities.  Troponin negative x1 EKG: No e/o STEMI. No evidence of Brugadas sign, delta wave, epsilon wave, significantly prolonged QTc, or malignant arrhythmia.  Disposition: Discharge. Patient is at baseline at this time. Return precautions expressed and understood in person. Advised follow up with primary care provider or clinic physician in next 24 hours.       ____________________________________________   FINAL CLINICAL IMPRESSION(S) / ED DIAGNOSES  Final diagnoses:  Seizure-like activity [facility])     ED Discharge Orders          Ordered    doxycycline (VIBRAMYCIN) 50 MG capsule  2 times daily        06/16/21 1528             Note:  This document was prepared using Dragon voice recognition software and may include unintentional dictation errors.    Merwyn Katos, MD 06/16/21 1540

## 2021-06-17 ENCOUNTER — Telehealth: Payer: Self-pay | Admitting: Nurse Practitioner

## 2021-06-17 NOTE — Telephone Encounter (Signed)
[facility] requested for I to contact Waneta Martins, Ms. Blunck's daughter who is currently residing at [facility]. I called [name]. We talked about recent hospitalization. We talked about current clinical condition. [name] talked about her experienced in ED. We talked about Hospice and order has been sent. We talked about PC. We talked about what Hospice provides. We talked about progression, coping strategies, emotional support provided.   Total time 20 minutes Documentation 5 minutes Phone discussion 15 minutes

## 2021-09-02 ENCOUNTER — Telehealth: Payer: Self-pay | Admitting: Cardiovascular Disease

## 2021-09-02 NOTE — Telephone Encounter (Signed)
Patient daughter cancelled upcoming recall appt due to ams from dementia .   Patient now on hospice.   Removed recall.

## 2021-09-07 ENCOUNTER — Ambulatory Visit: Payer: Medicare Other | Admitting: Cardiovascular Disease

## 2021-11-03 DEATH — deceased
